# Patient Record
Sex: Male | Born: 1995 | Race: White | Hispanic: No | Marital: Single | Smoking: Never smoker
Health system: Southern US, Community
[De-identification: ages and names within clinical notes are randomized; demographics above are authoritative.]

## PROBLEM LIST (undated history)

## (undated) DIAGNOSIS — G4730 Sleep apnea, unspecified: Secondary | ICD-10-CM

## (undated) DIAGNOSIS — K760 Fatty (change of) liver, not elsewhere classified: Secondary | ICD-10-CM

## (undated) DIAGNOSIS — F419 Anxiety disorder, unspecified: Secondary | ICD-10-CM

## (undated) DIAGNOSIS — K589 Irritable bowel syndrome without diarrhea: Secondary | ICD-10-CM

## (undated) DIAGNOSIS — M549 Dorsalgia, unspecified: Secondary | ICD-10-CM

## (undated) DIAGNOSIS — F909 Attention-deficit hyperactivity disorder, unspecified type: Secondary | ICD-10-CM

## (undated) DIAGNOSIS — F32A Depression, unspecified: Secondary | ICD-10-CM

## (undated) DIAGNOSIS — M2550 Pain in unspecified joint: Secondary | ICD-10-CM

## (undated) DIAGNOSIS — R0602 Shortness of breath: Secondary | ICD-10-CM

## (undated) DIAGNOSIS — J45909 Unspecified asthma, uncomplicated: Secondary | ICD-10-CM

## (undated) DIAGNOSIS — F1990 Other psychoactive substance use, unspecified, uncomplicated: Secondary | ICD-10-CM

## (undated) HISTORY — DX: Depression, unspecified: F32.A

## (undated) HISTORY — DX: Sleep apnea, unspecified: G47.30

## (undated) HISTORY — DX: Anxiety disorder, unspecified: F41.9

## (undated) HISTORY — DX: Unspecified asthma, uncomplicated: J45.909

## (undated) HISTORY — DX: Pain in unspecified joint: M25.50

## (undated) HISTORY — DX: Shortness of breath: R06.02

## (undated) HISTORY — DX: Dorsalgia, unspecified: M54.9

## (undated) HISTORY — PX: WISDOM TOOTH EXTRACTION: SHX21

## (undated) HISTORY — DX: Fatty (change of) liver, not elsewhere classified: K76.0

## (undated) HISTORY — DX: Other psychoactive substance use, unspecified, uncomplicated: F19.90

## (undated) HISTORY — DX: Irritable bowel syndrome, unspecified: K58.9

---
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
## 2018-06-21 ENCOUNTER — Ambulatory Visit (INDEPENDENT_AMBULATORY_CARE_PROVIDER_SITE_OTHER): Payer: 59 | Admitting: Clinical

## 2018-06-21 DIAGNOSIS — Z62820 Parent-biological child conflict: Secondary | ICD-10-CM | POA: Diagnosis not present

## 2018-07-05 ENCOUNTER — Ambulatory Visit (INDEPENDENT_AMBULATORY_CARE_PROVIDER_SITE_OTHER): Payer: 59 | Admitting: Clinical

## 2018-07-05 DIAGNOSIS — F4323 Adjustment disorder with mixed anxiety and depressed mood: Secondary | ICD-10-CM | POA: Diagnosis not present

## 2018-07-12 ENCOUNTER — Ambulatory Visit (INDEPENDENT_AMBULATORY_CARE_PROVIDER_SITE_OTHER): Payer: 59 | Admitting: Clinical

## 2018-07-12 DIAGNOSIS — F4323 Adjustment disorder with mixed anxiety and depressed mood: Secondary | ICD-10-CM | POA: Diagnosis not present

## 2018-07-19 ENCOUNTER — Ambulatory Visit (INDEPENDENT_AMBULATORY_CARE_PROVIDER_SITE_OTHER): Payer: 59 | Admitting: Clinical

## 2018-07-19 DIAGNOSIS — F4323 Adjustment disorder with mixed anxiety and depressed mood: Secondary | ICD-10-CM | POA: Diagnosis not present

## 2018-07-24 ENCOUNTER — Ambulatory Visit (INDEPENDENT_AMBULATORY_CARE_PROVIDER_SITE_OTHER): Payer: 59 | Admitting: Clinical

## 2018-07-24 DIAGNOSIS — F4323 Adjustment disorder with mixed anxiety and depressed mood: Secondary | ICD-10-CM | POA: Diagnosis not present

## 2018-08-07 ENCOUNTER — Ambulatory Visit (INDEPENDENT_AMBULATORY_CARE_PROVIDER_SITE_OTHER): Payer: 59 | Admitting: Clinical

## 2018-08-07 DIAGNOSIS — F4323 Adjustment disorder with mixed anxiety and depressed mood: Secondary | ICD-10-CM

## 2018-09-05 ENCOUNTER — Ambulatory Visit (INDEPENDENT_AMBULATORY_CARE_PROVIDER_SITE_OTHER): Payer: 59 | Admitting: Clinical

## 2018-09-05 DIAGNOSIS — Z62820 Parent-biological child conflict: Secondary | ICD-10-CM

## 2018-09-13 ENCOUNTER — Ambulatory Visit (INDEPENDENT_AMBULATORY_CARE_PROVIDER_SITE_OTHER): Payer: 59 | Admitting: Clinical

## 2018-09-13 DIAGNOSIS — Z62820 Parent-biological child conflict: Secondary | ICD-10-CM | POA: Diagnosis not present

## 2018-10-13 ENCOUNTER — Ambulatory Visit (INDEPENDENT_AMBULATORY_CARE_PROVIDER_SITE_OTHER): Payer: 59 | Admitting: Clinical

## 2018-10-13 DIAGNOSIS — F322 Major depressive disorder, single episode, severe without psychotic features: Secondary | ICD-10-CM

## 2018-11-24 ENCOUNTER — Ambulatory Visit (INDEPENDENT_AMBULATORY_CARE_PROVIDER_SITE_OTHER): Payer: 59 | Admitting: Clinical

## 2018-11-24 DIAGNOSIS — F4323 Adjustment disorder with mixed anxiety and depressed mood: Secondary | ICD-10-CM | POA: Diagnosis not present

## 2018-12-14 ENCOUNTER — Ambulatory Visit: Payer: 59 | Admitting: Clinical

## 2018-12-19 ENCOUNTER — Ambulatory Visit: Payer: 59 | Admitting: Clinical

## 2019-05-20 ENCOUNTER — Other Ambulatory Visit: Payer: Self-pay | Admitting: Family Medicine

## 2019-05-20 DIAGNOSIS — R748 Abnormal levels of other serum enzymes: Secondary | ICD-10-CM

## 2019-05-24 ENCOUNTER — Ambulatory Visit
Admission: RE | Admit: 2019-05-24 | Discharge: 2019-05-24 | Disposition: A | Discharge status: Home / Self Care | Payer: 59 | Source: Ambulatory Visit | Attending: Family Medicine | Admitting: Family Medicine

## 2019-05-24 DIAGNOSIS — R748 Abnormal levels of other serum enzymes: Secondary | ICD-10-CM

## 2020-05-03 ENCOUNTER — Other Ambulatory Visit: Payer: Self-pay

## 2020-05-03 ENCOUNTER — Ambulatory Visit (INDEPENDENT_AMBULATORY_CARE_PROVIDER_SITE_OTHER): Payer: 59 | Admitting: Psychiatry

## 2020-05-03 DIAGNOSIS — F332 Major depressive disorder, recurrent severe without psychotic features: Secondary | ICD-10-CM

## 2020-05-03 DIAGNOSIS — F4010 Social phobia, unspecified: Secondary | ICD-10-CM

## 2020-05-03 DIAGNOSIS — F900 Attention-deficit hyperactivity disorder, predominantly inattentive type: Secondary | ICD-10-CM | POA: Diagnosis not present

## 2020-05-03 DIAGNOSIS — F1910 Other psychoactive substance abuse, uncomplicated: Secondary | ICD-10-CM | POA: Diagnosis not present
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
## 2020-05-12 ENCOUNTER — Ambulatory Visit (INDEPENDENT_AMBULATORY_CARE_PROVIDER_SITE_OTHER): Payer: 59 | Admitting: Psychiatry

## 2020-05-12 ENCOUNTER — Ambulatory Visit: Payer: 59 | Admitting: Psychiatry

## 2020-05-12 ENCOUNTER — Other Ambulatory Visit: Payer: Self-pay

## 2020-05-12 DIAGNOSIS — F4010 Social phobia, unspecified: Secondary | ICD-10-CM

## 2020-05-12 DIAGNOSIS — F1910 Other psychoactive substance abuse, uncomplicated: Secondary | ICD-10-CM

## 2020-05-12 DIAGNOSIS — F900 Attention-deficit hyperactivity disorder, predominantly inattentive type: Secondary | ICD-10-CM | POA: Diagnosis not present

## 2020-05-12 DIAGNOSIS — R69 Illness, unspecified: Secondary | ICD-10-CM

## 2020-05-12 DIAGNOSIS — F332 Major depressive disorder, recurrent severe without psychotic features: Secondary | ICD-10-CM | POA: Diagnosis not present

## 2020-05-12 NOTE — Progress Notes (Signed)
Psychotherapy Progress Note [facility] [name], PhD LP  Patient ID: Louis [name]     MRN: [id number] Therapy format: Family therapy w/ patient -- accompanied by Louis [name] Date: 05/12/2020      Start: 9:13a     Stop: 10:03a     Time Spent: 50 min Location: In-person   Session narrative (presenting needs, interim history, self-report of stressors and symptoms, applications of prior therapy, status changes, and interventions made in session) M reports she and F see a few smiles since coming off medication and coming home, about 5 wks now.  Now off Adderall about 4 wks, no Klonopin since.  Little pressure to anxiety, still off pot, remains on escitalopram.  Days are OK, doesn't feel like he's doing enough.  Organizing his belongings to bring home his remaining things.  Seeking info online about computer science.  Engaged a Systems analyst, has gotten a couple sessions of weight training, likes it.  Sleep schedule stable trying to get to bed 11:30ish, but varies when falls asleep, a long as 2am.  Rising 9:30am, but takes prodding.  Have ordered orange glasses, arriving today.  Day naps can happen, as long as 1.5 hrs.  Feeding schedule was very irregular at [facility], more regular now at home.  Some meals skip, but enough calories.  M notices a lot of night snacking -- PB and crackers, brownies if available.  M tries to wake him from naps and encourage him out of his room.    Addressed M's wishes for guidance how and how much to push Louis [name], respectful process for helping him with sleep discipline and taking on tasks organizing his room.  Will be going to [facility] today to pick up non-furniture belongings (movers will show next week).  Emphasized adult agreement and personal autonomy for "coaching" interactions, trust in Louis [name]'s word how current efforts are working, constructive perspective, and breaking down intimidating tasks small enough to be willing to start, see through, and stop.   Agreements made for several projects to improve circadian rhythm.  Therapeutic modalities: Cognitive Behavioral Therapy, Solution-Oriented/Positive Psychology, and Psycho-education/Bibliotherapy  Mental Status/Observations:  Appearance:   Casual     Behavior:  Appropriate  Motor:  Normal  Speech/Language:   Clear and Coherent  Affect:  Appropriate  Mood:  anxious, dysthymic, and less  Thought process:  normal  Thought content:    WNL  Sensory/Perceptual disturbances:    WNL  Orientation:  Fully oriented  Attention:  Good    Concentration:  Good  Memory:  WNL  Insight:    Good  Judgment:   Good  Impulse Control:  Fair   Risk Assessment: Danger to Self: No Self-injurious Behavior: No Danger to Others: No Physical Aggression / Violence: No Duty to Warn: No Access to Firearms a concern: No  Assessment of progress:  progressing  Diagnosis:   ICD-10-CM   1. 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 Attention deficit hyperactivity disorder (ADHD), predominantly inattentive type  F90.0     3. Social anxiety disorder  F40.10     4. Polysubstance abuse (HCC)  F19.10     5. r/o adverse effect of amphetamine  R69      Plan:  Try a 10pm calorie curfew, water maybe after that 45-minute standard for giving Louis [name] a "nudge" to wake up from nap.  Procedurally, will ask Louis [name] to sit up and/or stand to see that it's awake enough,  be ready to remind that it's the deal he wanted, and if it turns into a bigger struggle than that, be ready to let him have his autonomy.   Endorse the personal trainer idea Endorse lighting well in the morning -- option to use mood light at home. Continue recommendation to use amber lenses. Endorse brisk awakening Encourage isolating cleanup tasks for the room Possible activity scheduling Other recommendations/advice as may be noted above Continue to utilize previously learned skills ad lib Maintain  medication as prescribed and work faithfully with relevant prescriber(s) if any changes are desired or seem indicated Call the clinic on-call service, present to ER, or call 911 if any life-threatening psychiatric crisis Return in about 1 week (around 05/19/2020) for time as available, recommend scheduling ahead. Already scheduled visit in this office 05/19/2020.  Louis Fries, PhD [name], PhD LP Clinical Psychologist, [facility] [address] [place], [place] [address] [phone]

## 2020-05-16 ENCOUNTER — Ambulatory Visit: Payer: 59 | Admitting: Adult Health

## 2020-05-19 ENCOUNTER — Ambulatory Visit (INDEPENDENT_AMBULATORY_CARE_PROVIDER_SITE_OTHER): Payer: 59 | Admitting: Psychiatry

## 2020-05-19 ENCOUNTER — Other Ambulatory Visit: Payer: Self-pay

## 2020-05-19 DIAGNOSIS — F332 Major depressive disorder, recurrent severe without psychotic features: Secondary | ICD-10-CM | POA: Diagnosis not present

## 2020-05-19 DIAGNOSIS — F1910 Other psychoactive substance abuse, uncomplicated: Secondary | ICD-10-CM

## 2020-05-19 DIAGNOSIS — R69 Illness, unspecified: Secondary | ICD-10-CM

## 2020-05-19 DIAGNOSIS — F4010 Social phobia, unspecified: Secondary | ICD-10-CM

## 2020-05-19 DIAGNOSIS — F900 Attention-deficit hyperactivity disorder, predominantly inattentive type: Secondary | ICD-10-CM | POA: Diagnosis not present

## 2020-05-19 NOTE — Progress Notes (Signed)
Psychotherapy Progress Note [facility] [name], PhD LP  Patient ID: Louis [name]     MRN: [id number] Therapy format: Family therapy w/ patient -- accompanied by Louis [name] Date: 05/19/2020      Start: 8:18a     Stop: 9:06a     Time Spent: 48 min Location: In-person   Session narrative (presenting needs, interim history, self-report of stressors and symptoms, applications of prior therapy, status changes, and interventions made in session) Early success breaking down his clutter pile by looking and choosing smaller pieces of it, e.g., one bag at a time.  Observing the 10pm calorie curfew.  Helpful with the furniture move.  Socially, got out with a friend, his ex (parted amiably, logistics demanded).    Process for "nudging" wakeups working to some extent.  M does want him to start using his own alarm effectively.  Discussed tactics. Mother trying to get him outside for small tasks, get sunshine, engage more.  Wants to get him broken out of hours of gaming, eating better, circulating.  Currently engaged with a trainer 2/wk and liking the exercise.  Quandaries about how and how much to press [name] to do things.  Advice to keep things framed as his adult choice, be authentic about herself believing in and wanting him to try these things, but leave it to his choice.  Notes stomach pain that happens from time to time, figures he has some form of IBS, urgency to go sometimes, especially under stress.  Has hyoscyamine.    Notes a hobby of speed-running [name] game, serves as an attachment to the past.    Discussed introversion, hx of GI issues in family, how [name] actually was devastated with breaking up.    Discussed likely insurance conditions re. frequent use of services.  As [name] is not presently suicidal, frequency is likely to be challenged on review if continued beyond a couple more weeks.  Agree to wean schedule.  Therapeutic modalities: Cognitive Behavioral  Therapy, Solution-Oriented/Positive Psychology, and Psycho-education/Bibliotherapy  Mental Status/Observations:  Appearance:   Casual     Behavior:  Appropriate  Motor:  Normal  Speech/Language:   Clear and Coherent  Affect:  Appropriate  Mood:  anxious  Thought process:  normal  Thought content:    WNL  Sensory/Perceptual disturbances:    WNL  Orientation:  Fully oriented  Attention:  Good    Concentration:  Fair  Memory:  WNL  Insight:    Good  Judgment:   Fair  Impulse Control:  Fair   Risk Assessment: Danger to Self: No Self-injurious Behavior: No Danger to Others: No Physical Aggression / Violence: No Duty to Warn: No Access to Firearms a concern: No  Assessment of progress:  progressing  Diagnosis:   ICD-10-CM   1. 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 Attention deficit hyperactivity disorder (ADHD), predominantly inattentive type  F90.0     3. Social anxiety disorder  F40.10     4. Polysubstance abuse (HCC)  F19.10     5. r/o IBS  R69      Plan:  Continue circadian rhythm interventions with light control, food curfew, and regulating bed/wake Resolved [name] set his own alarm, mother give half hour to prompt him if affordable, and seek to get him upright before letting him decide his next move. Advice to mother change the pronouns, change the pressure -- use 1st or 3rd person, not 2nd in providing  instructions or wishes For outbursts or anger both ways, recommend do-overs to cast more positive atmosphere and motivate to practice more functional behavior rather than overfocus on dysfunctional Referral to psychiatry, at request Other recommendations/advice as may be noted above Continue to utilize previously learned skills ad lib Maintain medication as prescribed and work faithfully with relevant prescriber(s) if any changes are desired or seem indicated  Call the clinic on-call service, present to ER, or call  911 if any life-threatening psychiatric crisis Return 1-2/wk as deemed necessary. Already scheduled visit in this office 05/22/2020.  Robley Fries, PhD [name], PhD LP Clinical Psychologist, [facility] [address] [place], [place] [address] [phone]

## 2020-05-21 IMAGING — US US ABDOMEN COMPLETE
2 series · 14 of 25 positions shown · non-contrast
Comparison: None.

CLINICAL DATA: Elevated liver enzymes

EXAM:
ABDOMEN ULTRASOUND COMPLETE

[Series 1: us abdomen complete · 0.19mm/px · 10 of 62 slices shown (1 of 2)]
[im 1/62]
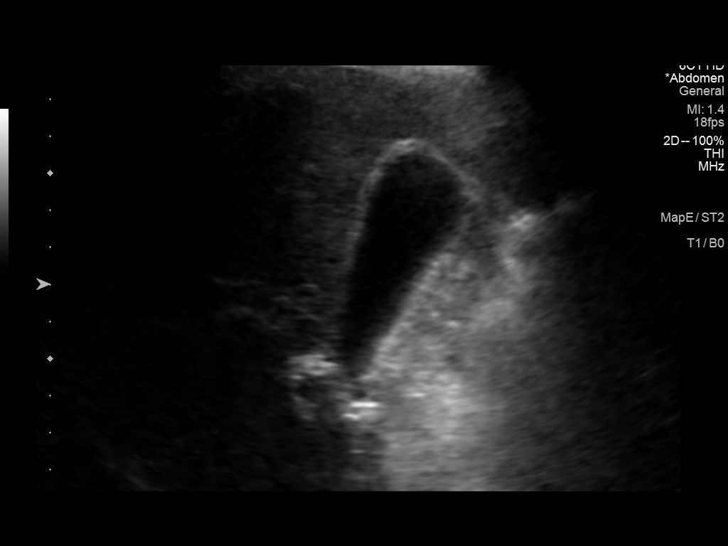
[im 8/62]
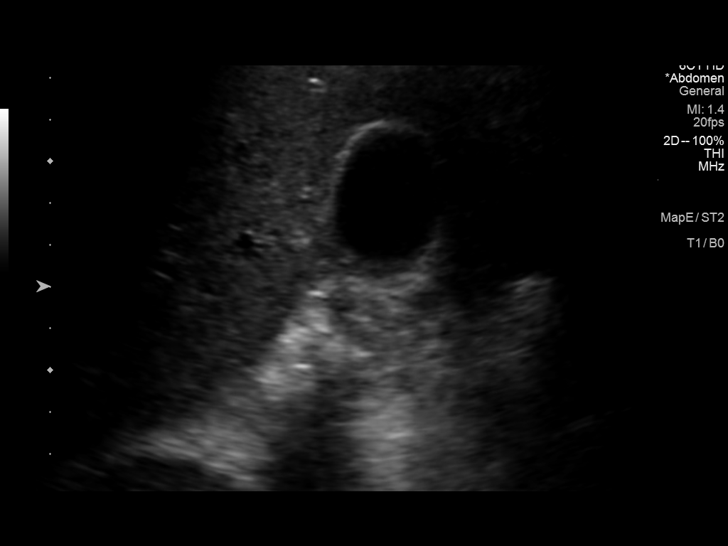
[im 16/62]
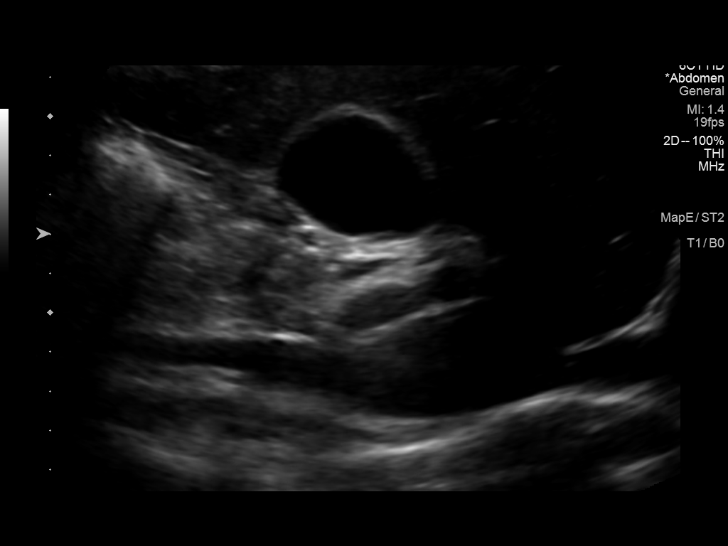
[im 23/62]
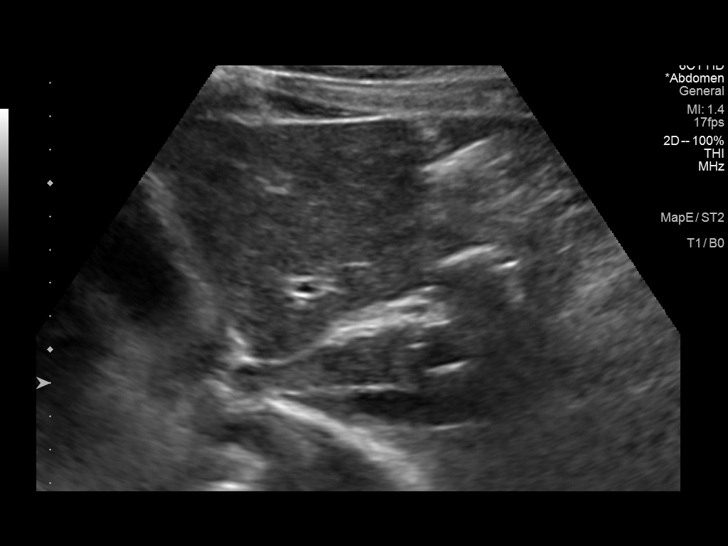
[im 31/62]
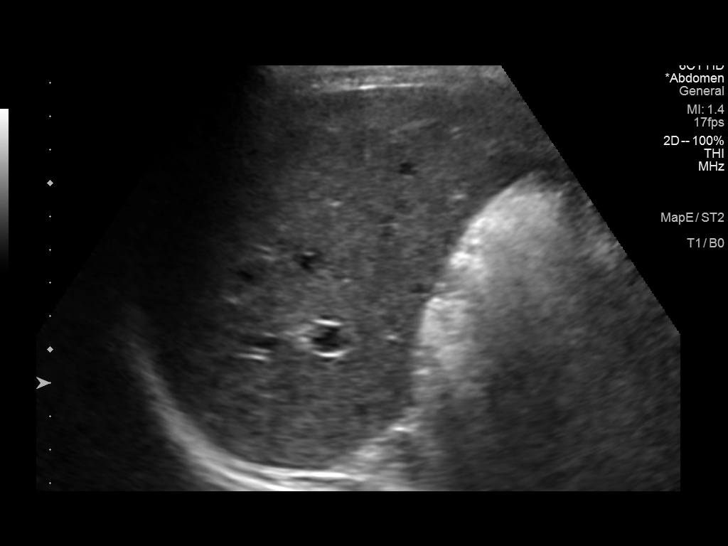
[im 35/62]
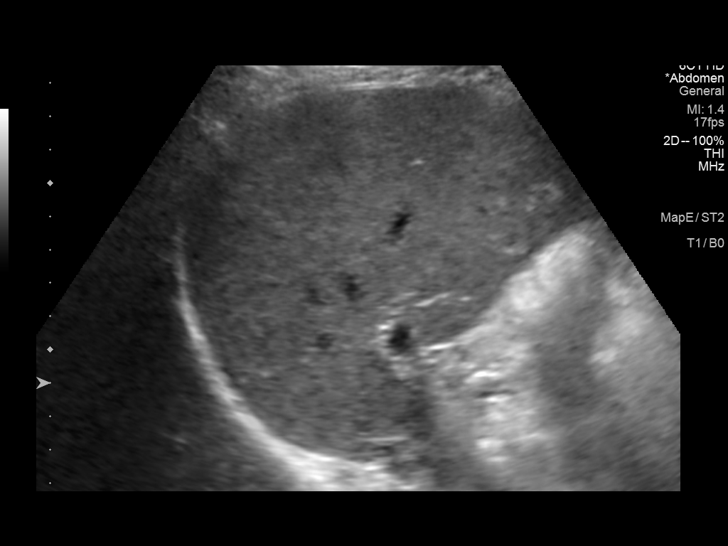
[im 42/62]
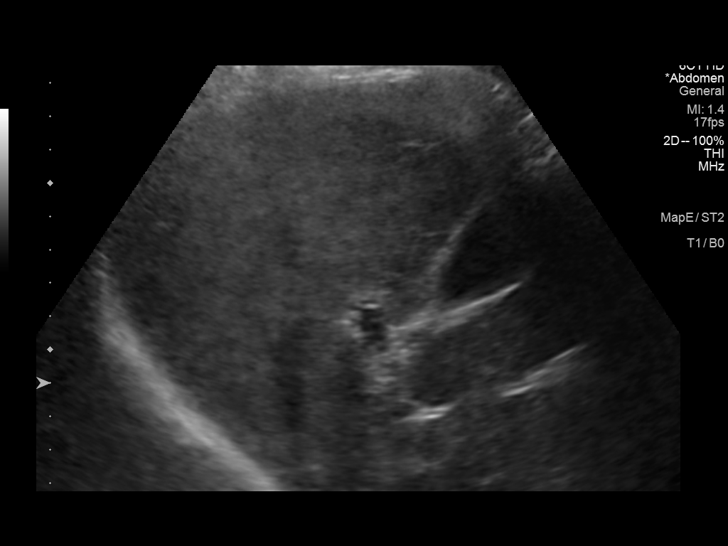
[im 50/62]
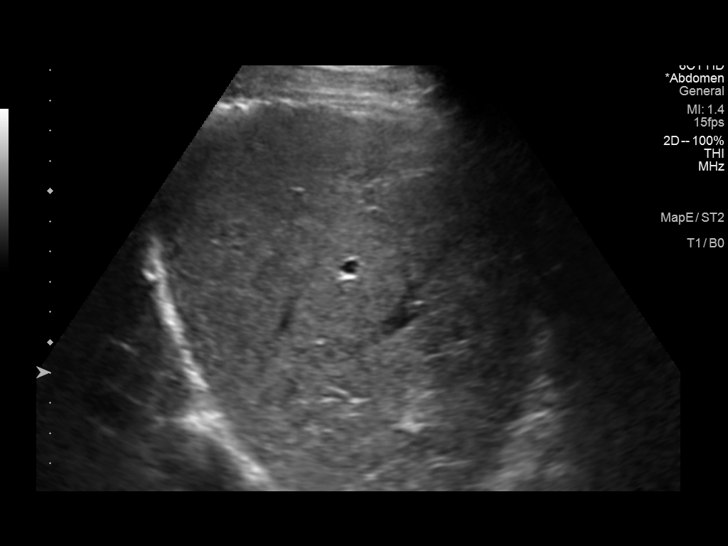
[im 58/62]
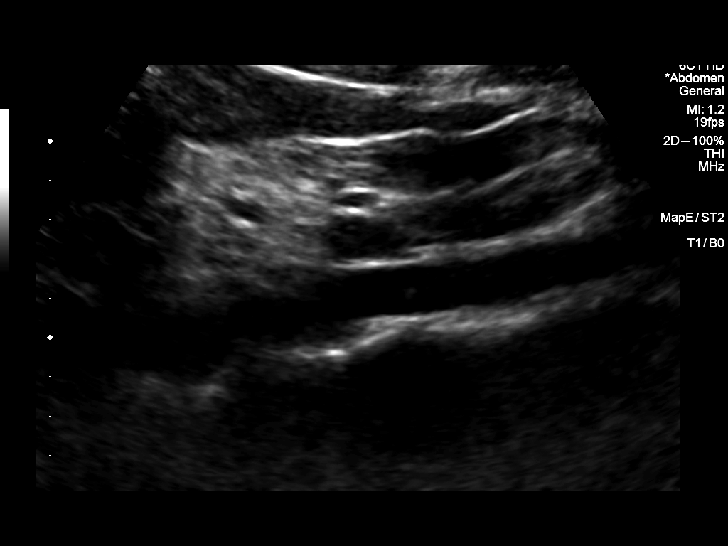
[im 62/62]
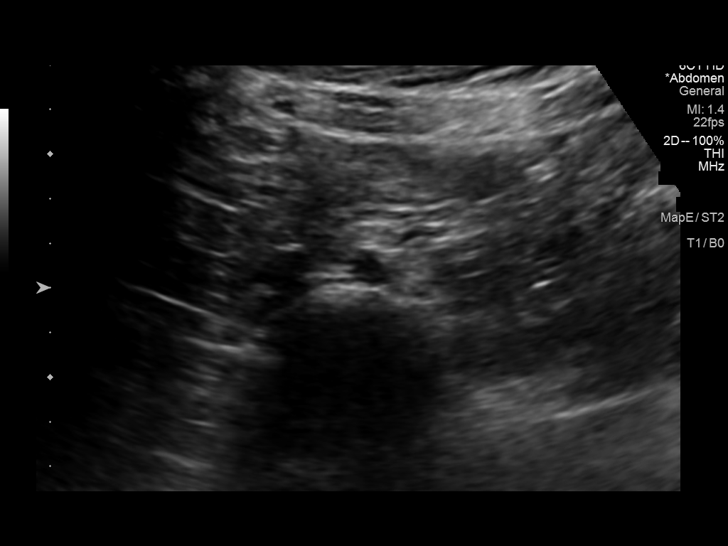

[Series 2001: us abdomen complete · 0.25mm/px · 4 of 30 slices shown (2 of 2)]
[im 5/30]
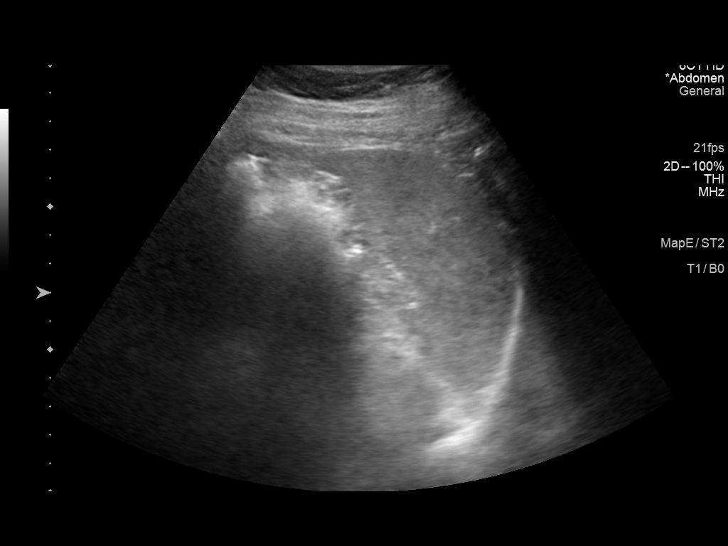
[im 13/30]
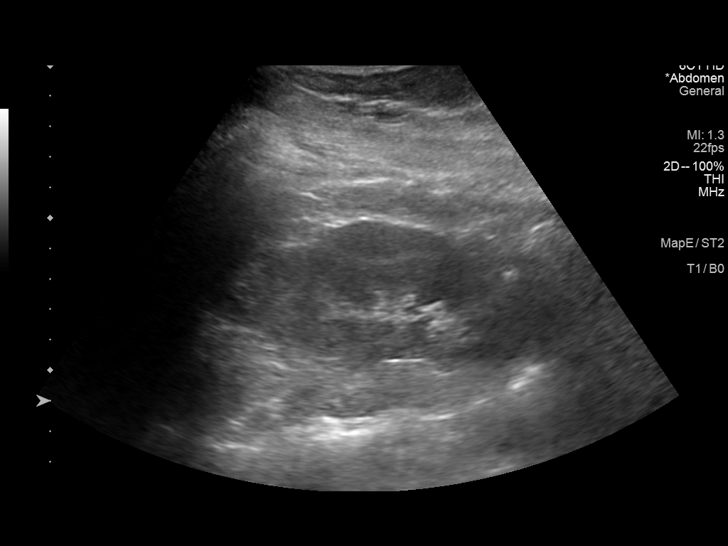
[im 21/30]
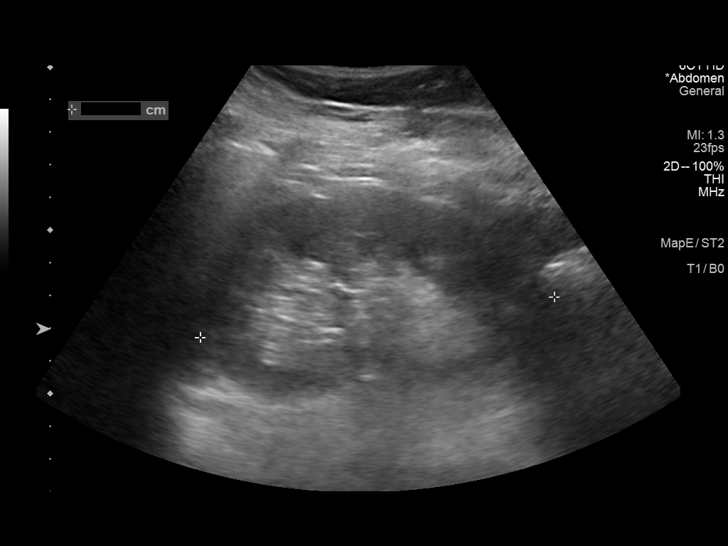
[im 30/30]
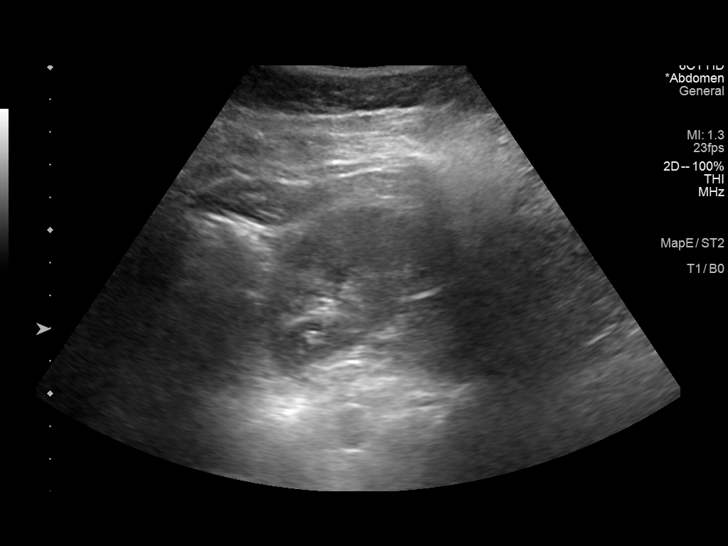

[14 of 25 positions shown; findings below may reference images not displayed]

FINDINGS: Gallbladder: No gallstones or wall thickening visualized. No
sonographic Murphy sign noted by sonographer.

Common bile duct: Diameter: 4 mm, normal

Liver: No focal lesion identified. Within normal limits in
parenchymal echogenicity. Portal vein is patent on color Doppler
imaging with normal direction of blood flow towards the liver.

IVC: No abnormality visualized.

Pancreas: Obscured by bowel gas.

Spleen: Size and appearance within normal limits.

Right Kidney: Length: 11.9 cm. Echogenicity within normal limits. No
mass or hydronephrosis visualized.

Left Kidney: Length: 11 cm. Echogenicity within normal limits. No
mass or hydronephrosis visualized.

Abdominal aorta: No aneurysm visualized.

Other findings: None.
IMPRESSION: Normal ultrasound of the abdomen.

## 2020-05-22 ENCOUNTER — Ambulatory Visit (INDEPENDENT_AMBULATORY_CARE_PROVIDER_SITE_OTHER): Payer: 59 | Admitting: Psychiatry

## 2020-05-22 ENCOUNTER — Other Ambulatory Visit: Payer: Self-pay

## 2020-05-22 ENCOUNTER — Ambulatory Visit: Payer: 59 | Admitting: Psychiatry

## 2020-05-22 DIAGNOSIS — R748 Abnormal levels of other serum enzymes: Secondary | ICD-10-CM

## 2020-05-22 DIAGNOSIS — F900 Attention-deficit hyperactivity disorder, predominantly inattentive type: Secondary | ICD-10-CM

## 2020-05-22 DIAGNOSIS — F1910 Other psychoactive substance abuse, uncomplicated: Secondary | ICD-10-CM

## 2020-05-22 DIAGNOSIS — F4010 Social phobia, unspecified: Secondary | ICD-10-CM | POA: Diagnosis not present

## 2020-05-22 DIAGNOSIS — R69 Illness, unspecified: Secondary | ICD-10-CM

## 2020-05-22 DIAGNOSIS — F332 Major depressive disorder, recurrent severe without psychotic features: Secondary | ICD-10-CM | POA: Diagnosis not present
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
## 2020-05-25 ENCOUNTER — Ambulatory Visit: Payer: 59 | Admitting: Psychiatry

## 2020-05-30 ENCOUNTER — Other Ambulatory Visit: Payer: Self-pay

## 2020-05-30 ENCOUNTER — Ambulatory Visit (INDEPENDENT_AMBULATORY_CARE_PROVIDER_SITE_OTHER): Payer: 59 | Admitting: Psychiatry

## 2020-05-30 DIAGNOSIS — F1910 Other psychoactive substance abuse, uncomplicated: Secondary | ICD-10-CM | POA: Diagnosis not present

## 2020-05-30 DIAGNOSIS — F4010 Social phobia, unspecified: Secondary | ICD-10-CM

## 2020-05-30 DIAGNOSIS — F331 Major depressive disorder, recurrent, moderate: Secondary | ICD-10-CM | POA: Diagnosis not present

## 2020-05-30 DIAGNOSIS — F900 Attention-deficit hyperactivity disorder, predominantly inattentive type: Secondary | ICD-10-CM

## 2020-05-30 DIAGNOSIS — R69 Illness, unspecified: Secondary | ICD-10-CM

## 2020-05-30 DIAGNOSIS — R748 Abnormal levels of other serum enzymes: Secondary | ICD-10-CM
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
## 2020-06-02 ENCOUNTER — Ambulatory Visit: Payer: 59 | Admitting: Psychiatry

## 2020-06-06 ENCOUNTER — Ambulatory Visit: Payer: 59 | Admitting: Psychiatry

## 2020-06-08 ENCOUNTER — Ambulatory Visit: Payer: 59 | Admitting: Psychiatry

## 2020-06-13 ENCOUNTER — Ambulatory Visit (INDEPENDENT_AMBULATORY_CARE_PROVIDER_SITE_OTHER): Payer: 59 | Admitting: Psychiatry

## 2020-06-13 ENCOUNTER — Other Ambulatory Visit: Payer: Self-pay

## 2020-06-13 DIAGNOSIS — F331 Major depressive disorder, recurrent, moderate: Secondary | ICD-10-CM

## 2020-06-13 DIAGNOSIS — F1910 Other psychoactive substance abuse, uncomplicated: Secondary | ICD-10-CM

## 2020-06-13 DIAGNOSIS — F4010 Social phobia, unspecified: Secondary | ICD-10-CM | POA: Diagnosis not present

## 2020-06-13 DIAGNOSIS — R748 Abnormal levels of other serum enzymes: Secondary | ICD-10-CM

## 2020-06-13 DIAGNOSIS — F900 Attention-deficit hyperactivity disorder, predominantly inattentive type: Secondary | ICD-10-CM

## 2020-06-13 DIAGNOSIS — R69 Illness, unspecified: Secondary | ICD-10-CM

## 2020-06-13 NOTE — Progress Notes (Signed)
Psychotherapy Progress Note [facility] Louis Czar, PhD LP  Patient ID: Louis [name]     MRN: [id number] Therapy format: Family therapy w/ patient -- accompanied by Louis [name] Date: 06/13/2020      Start: 11:12a     Stop: 11:58a     Time Spent: 46 min Location: In-person   Session narrative (presenting needs, interim history, self-report of stressors and symptoms, applications of prior therapy, status changes, and interventions made in session) Recently cancelled sessions d/t new job.  Scheduled 1 wk from today for initial psychiatric review with Louis [name] of this office.  Reports job going very favorably so far.  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  The job is helping sleep much more effectively and beginning to turn in earlier to accommodate early rise time.  Much reduced napping.  Affirmed and encouraged.  Mother's observation that this is accurate for 1 week now.  He is traveling to friends and getting home at reasonable hours.  Requires much less assistance waking, and she has seems more spontaneous room cleaning.  Louis [name] rates his mood at 7/10 versus 0 at the time he withdrew from school and 3 at the time he began treatment.  Still intends to return to college.  Says for himself that he wanted to put himself in a more interactive job in order to help combat social anxiety and regain his old comfort zone from before and responsive to intervention college.  Individually, Louis [name] affirms that he is feeling useful in his work and that it will serve purposes until he returns to school.  Reports he continues to do better emotionally off stimulant but is struggling some with attention on the job, losing his place sometimes in order preparation and needing frequent reminders of how many of an item to make or how many he has been through.  Discussed the possibility of creating a system to keep count without having to pick up a writing instrument and  contaminate food preparation.  Regarding substance abuse, continues to find himself more clearheaded while not binging on pot.  Reports time with his friends as a respectful environment with no pressure to use and full respect if he chooses not to.  Consumption currently is 2 bowls in 2 days, as opposed to his history of 2 bowls by 11 AM.  He has also been switching to using delta 8 rather than conventional THC, which is further reducing brain fog while still delivering mild euphoria he seeks.  Estimates the high at 5 to 10% compared to previous 90%.  Mother seeks referral for her brother, with presenting complaints of being overweight and emotional eating.  Discussed ethics of treating multiple family members, availability and fit to other therapists in this practice, and made referral.  Therapeutic modalities: Cognitive Behavioral Therapy and Solution-Oriented/Positive Psychology  Mental Status/Observations:  Appearance:   Casual     Behavior:  Appropriate  Motor:  Normal  Speech/Language:   Clear and Coherent  Affect:  Appropriate  Mood:  normal  Thought process:  normal  Thought content:    WNL  Sensory/Perceptual disturbances:    WNL  Orientation:  Fully oriented  Attention:  Good    Concentration:  Good  Memory:  WNL  Insight:    Good  Judgment:   Good  Impulse Control:  Fair   Risk Assessment: Danger to Self: No Self-injurious Behavior: No Danger to Others: No Physical Aggression /  Violence: No Duty to Warn: No Access to Firearms a concern: No  Assessment of progress:  progressing well  Diagnosis:   ICD-10-CM   1. Major depressive disorder, recurrent episode, moderate with anxious distress (HCC)  F33.1     2. Attention deficit hyperactivity disorder (ADHD), predominantly inattentive type  F90.0     3. Social anxiety disorder  F40.10     4. Polysubstance abuse (HCC)  F19.10     5. r/o IBS  R69     6. Liver enzyme elevation (by self-report, possibly a kidney issue)   R74.8      Plan:  Continue efforts to regulate circadian rhythm and promote better activity for antidepression Continue communication measures where conflicts may be present Continue minimal prompting from mother for waking up, depending on performance Continue to advise abstinence from both alcohol, nicotine, and pot, allowing for use of delta 8 as a weaning strategy Other recommendations/advice as may be noted above Continue to utilize previously learned skills ad lib Maintain medication as prescribed and work faithfully with relevant prescriber(s) if any changes are desired or seem indicated Call the clinic on-call service, present to ER, or call 911 if any life-threatening psychiatric crisis Return in about 1 week (around 06/20/2020). Already scheduled visit in this office 06/20/2020.  Louis Fries, PhD Louis Czar, PhD LP Clinical Psychologist, [facility] [address], [address] [place], [place] [address] [phone]

## 2020-06-15 ENCOUNTER — Ambulatory Visit: Payer: 59 | Admitting: Adult Health

## 2020-06-16 ENCOUNTER — Ambulatory Visit: Payer: 59 | Admitting: Psychiatry

## 2020-06-20 ENCOUNTER — Ambulatory Visit (INDEPENDENT_AMBULATORY_CARE_PROVIDER_SITE_OTHER): Payer: 59 | Admitting: Psychiatry

## 2020-06-20 ENCOUNTER — Ambulatory Visit (INDEPENDENT_AMBULATORY_CARE_PROVIDER_SITE_OTHER): Payer: 59 | Admitting: Adult Health

## 2020-06-20 ENCOUNTER — Other Ambulatory Visit: Payer: Self-pay

## 2020-06-20 DIAGNOSIS — F1910 Other psychoactive substance abuse, uncomplicated: Secondary | ICD-10-CM

## 2020-06-20 DIAGNOSIS — F900 Attention-deficit hyperactivity disorder, predominantly inattentive type: Secondary | ICD-10-CM

## 2020-06-20 DIAGNOSIS — F331 Major depressive disorder, recurrent, moderate: Secondary | ICD-10-CM

## 2020-06-20 DIAGNOSIS — F411 Generalized anxiety disorder: Secondary | ICD-10-CM | POA: Diagnosis not present

## 2020-06-20 DIAGNOSIS — F4010 Social phobia, unspecified: Secondary | ICD-10-CM | POA: Diagnosis not present

## 2020-06-20 NOTE — Progress Notes (Signed)
Psychotherapy Progress Note [facility] Louis Czar, PhD LP  Patient ID: Louis [name]     MRN: [id number] Therapy format: Family therapy w/ patient -- accompanied by Louis [name] Date: 06/20/2020      Start: 11:20a     Stop: 12:12p     Time Spent: 52 min Location: In-person   Session narrative (presenting needs, interim history, self-report of stressors and symptoms, applications of prior therapy, status changes, and interventions made in session) Continues working job, getting up pretty easily w/o much assistance.  One challenge with getting his room and cleaning done before visiting friends last weekend.  Working up to vacation in [place] as a family.  Had an overly sleepy day, felt depressed.  Mother tried to emphasize doing the usual, healthy things she figures would be part of being more reliably functional.  Wants him to learn some more self-sufficiency, tentatively listing several things.  Today M notes relapse in vaping (nicotine), and she feels like an "accessory" for enabling it, with the money to buy it.  Family were bankrolling his school life in entirety.  Concerns for spending to eat out a lot and spending for an addictive substance.  Admits ground rules and standards have been hard for her and his father to define or hold over a relatively long period of time, and she feels guilty for it.  Established in principle that one likely parent stance would be to consider substances as "luxuries", so any allowance or subsidy to the older adolescent would do well to count the costs of necessities -- food, shelter, fuel, coverage, education costs when active -- and set support somewhere near that level, adjusting as they see fit for discretionary spending, hen leave it to the adolescent's discretion.  Nicotine has been part of the picture since age 25, typically via vape.  On and off it since home from school, with no established abstinence.  Disc the potential for lung damage, but  he does not use the form noted for "popcorn lung".  Purports to want to quit.  Limited experience with nicotine gum, no history of trying patch.  Has tried Wellbutrin, but adverse emotional reaction when on it and a stimulant simultaneously.  Encouraged look into insurance company's recommendations and offerings for cessation programs, as this is beyond my expertise and experience.  Offered that what I can help do, if a focus, is develop alternative behaviors for self-soothing in order to weather cravings and and strengthen motivation to abstain.  Therapeutic modalities: Cognitive Behavioral Therapy, Solution-Oriented/Positive Psychology, and Motivational Interviewing  Mental Status/Observations:  Appearance:   Casual     Behavior:  Appropriate  Motor:  Normal  Speech/Language:   Clear and Coherent  Affect:  Appropriate and Constricted  Mood:  anxious and dysthymic  Thought process:  normal  Thought content:    WNL  Sensory/Perceptual disturbances:    WNL  Orientation:  Fully oriented  Attention:  Good    Concentration:  Good  Memory:  WNL  Insight:    Good  Judgment:   Good  Impulse Control:  Fair   Risk Assessment: Danger to Self: No Self-injurious Behavior: No Danger to Others: No Physical Aggression / Violence: No Duty to Warn: No Access to Firearms a concern: No  Assessment of progress:  stabilized  Diagnosis:   ICD-10-CM   1. Major depressive disorder, recurrent episode, moderate with anxious distress (HCC)  F33.1     2. Attention deficit hyperactivity disorder (ADHD), predominantly inattentive type  F90.0     3. Social anxiety disorder  F40.10     4. Polysubstance abuse (HCC) -- nicotine, THC  F19.10      Plan:  Continue efforts to regulate circadian rhythm and promote better activity for antidepression Continue communication measures where conflicts may be present Continue minimal prompting from mother for waking up, depending on performance Continue to advise  abstinence from alcohol, nicotine, and pot, allowing for use of delta 8 as a weaning strategy Look into smoking cessation services via insurance company, review motivations to quit vape Parents option to rethink monetary support and whether to tie some of it to [employer] work Other recommendations/advice as may be noted above Continue to utilize previously learned skills ad lib Maintain medication as prescribed and work faithfully with relevant prescriber(s) if any changes are desired or seem indicated Call the clinic on-call service, present to ER, or call 911 if any life-threatening psychiatric crisis Return in about 2 weeks (around 07/04/2020), or following travel. Already scheduled visit in this office 06/20/2020.  Robley Fries, PhD Louis Czar, PhD LP Clinical Psychologist, [facility] [address], [address] [place], [place] [address] [phone]

## 2020-06-20 NOTE — Progress Notes (Signed)
[facility] MD[facility]PA/NP Initial Note  06/21/2020 12:59 PM Louis [name]  MRN:  [id number]  Chief Complaint:   Accompanied by mother - also contributing to interview.  HPI: Describes mood today as "ok". Pleasant. Flat. Mood symptoms - reports depression - sometimes, not the worst it's ever been, but not perfect. Increased depression while at school. Increased anxiety at times - worse while in school. Feels like the anxiety has improved since returning home. Increased panic attacks while in school. Feels irritable at times. Currently taking Lexapro at 20mg  and feels like it is helpful. Started 2 months ago. Has taken Adderall in the past. Is now off of the Adderall x 1 month. Mother notes he seems better without it. However, he feels like it was helpful. Noting it could have been other things he was doing as well.  Feels like his energy is really low and has issues remembering things in work setting - "can't focus on things". Feels like he gets "stuck a lot". Concerned about returning to school without medication.Plans to take one more semester off before returning to school. Stable interest and motivation. Taking medications as prescribed.  Energy levels lower than average. Active, has a regular exercise routine. Working with a . Enjoys some usual interests and activities. Single. Not dating. Lives with parents. Has a cat. Spending time with family and friends. Appetite adequate. Weight gain recently - overeating at night.  Sleeps well most nights. Averages 7 to 8 hours - sleep schedule more consistent recently. Focus and concentration difficulties. Diagnosed with ADHD at age 59 years - CAS. Symptoms present since second grade. 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 Completing tasks. Managing aspects of household. Working as a [employer] currently - 30 hours. Denies SI or HI.  Denies AH or VH. Substance use - nicotine vaping. History of  THC. History of ETOH use - elevated LFT's. Following up with Dr. Financial risk analyst at [facility].  Previous medication trials: Prozac, Wellbutrin, Vyvanse, Adderall  Visit Diagnosis:    ICD-10-CM   1. Major depressive disorder, recurrent episode, moderate (HCC)  F33.1     2. Generalized anxiety disorder  F41.1     3. Attention deficit hyperactivity disorder (ADHD), predominantly inattentive type  F90.0     4. Polysubstance abuse (HCC)  F19.10       Past Psychiatric History: Denies psychiatric hospitalization.   Past Medical History:  Past Medical History:  Diagnosis Date   ADHD    No past surgical history on file.  Family Psychiatric History: Family history of mental illness - father and brother with depression.   Family History: No family history on file.  Social History:  Social History   Socioeconomic History   Marital status: Single    Spouse name: Not on file   Number of children: Not on file   Years of education: Not on file   Highest education level: Not on file  Occupational History   Not on file  Tobacco Use   Smoking status: Never   Smokeless tobacco: Never  Substance and Sexual Activity   Alcohol use: Not on file   Drug use: Not on file   Sexual activity: Not on file  Other Topics Concern   Not on file  Social History Narrative   Not on file   Social Determinants of Health   Financial Resource Strain: Not on file  Food Insecurity: Not on file  Transportation Needs: Not on file  Physical Activity:  Not on file  Stress: Not on file  Social Connections: Not on file    Allergies: No Known Allergies  Metabolic Disorder Labs: No results found for: HGBA1C, MPG No results found for: PROLACTIN No results found for: CHOL, TRIG, HDL, CHOLHDL, VLDL, LDLCALC No results found for: TSH  Therapeutic Level Labs: No results found for: LITHIUM No results found for: VALPROATE No components found for:  CBMZ  Current Medications: Current Outpatient Medications   Medication Sig Dispense Refill   escitalopram (LEXAPRO) 20 MG tablet Take 1 tablet by mouth daily.     No current facility-administered medications for this visit.    Medication Side Effects: none  Orders placed this visit:  No orders of the defined types were placed in this encounter.   Psychiatric Specialty Exam:  Review of Systems  There were no vitals taken for this visit.There is no height or weight on file to calculate BMI.  General Appearance: Casual and Neat  Eye Contact:  Good  Speech:  Clear and Coherent and Normal Rate  Volume:  Normal  Mood:  Euthymic  Affect:  Appropriate, Congruent, and Flat  Thought Process:  Coherent and Descriptions of Associations: Intact  Orientation:  Full (Time, Place, and Person)  Thought Content: Logical   Suicidal Thoughts:  No  Homicidal Thoughts:  No  Memory:  WNL  Judgement:  Good  Insight:  Good  Psychomotor Activity:  Normal  Concentration:  Concentration: Fair  Recall:  Good  Fund of Knowledge: Good  Language: Good  Assets:  Communication Skills Desire for Improvement Financial Resources/Insurance Housing Intimacy Leisure Time Physical Health Resilience Social Support Talents/Skills Transportation Vocational/Educational  ADL's:  Intact  Cognition: WNL  Prognosis:  NA   Screenings:  Oceanographer Row Counselor from 05/03/2020 in [facility]  PHQ-2 Total Score 5  PHQ-9 Total Score 20       Receiving Psychotherapy: Yes Andrew Mitchum.  Treatment Plan/Recommendations:   Plan:  PDMP reviewed  Continue Lexapro 20mg  daily x 2 to 3 months  Working with Dr  Completed Genesight testing while in office. Plan to consider ADHD alternatives outside of stimulants.  Has ceased substance use.   Time spent with patient was 60 minutes. Greater than 50% of face to face time with patient was spent on counseling and coordination of care.      RTC 4 weeks  Patient advised to  contact office with any questions, adverse effects, or acute worsening in signs and symptoms.      Farrel Demark, NP

## 2020-06-21 ENCOUNTER — Encounter: Payer: Self-pay | Admitting: Adult Health

## 2020-06-27 ENCOUNTER — Ambulatory Visit: Payer: 59 | Admitting: Psychiatry

## 2020-07-04 ENCOUNTER — Ambulatory Visit: Payer: 59 | Admitting: Psychiatry

## 2020-07-05 ENCOUNTER — Ambulatory Visit (INDEPENDENT_AMBULATORY_CARE_PROVIDER_SITE_OTHER): Payer: 59 | Admitting: Psychiatry

## 2020-07-05 ENCOUNTER — Other Ambulatory Visit: Payer: Self-pay

## 2020-07-05 DIAGNOSIS — F331 Major depressive disorder, recurrent, moderate: Secondary | ICD-10-CM | POA: Diagnosis not present

## 2020-07-05 DIAGNOSIS — F1910 Other psychoactive substance abuse, uncomplicated: Secondary | ICD-10-CM | POA: Diagnosis not present

## 2020-07-05 DIAGNOSIS — F900 Attention-deficit hyperactivity disorder, predominantly inattentive type: Secondary | ICD-10-CM | POA: Diagnosis not present

## 2020-07-05 DIAGNOSIS — F4010 Social phobia, unspecified: Secondary | ICD-10-CM

## 2020-07-05 DIAGNOSIS — F411 Generalized anxiety disorder: Secondary | ICD-10-CM

## 2020-07-05 NOTE — Progress Notes (Signed)
Psychotherapy Progress Note [facility] Louis Czar, PhD LP  Patient ID: Louis [name]     MRN: [id number] Therapy format: Individual psychotherapy Date: 07/05/2020      Start: 5:08p     Stop: 5:56p     Time Spent: 48 min Location: In-person   Session narrative (presenting needs, interim history, self-report of stressors and symptoms, applications of prior therapy, status changes, and interventions made in session) Seen by med management same day as last therapy visit, choice made to continue Lexapro and conduct Genesight testing to inform med choices.  Stated he has ceased substance abuse, though this may have been a statement made with mother present, whereas he has acknowledged much-reduced pot use to Louis [name], only with friends.    Today, here alone.  Says he enjoyed the family trip to [name] better than expected.  Helps to be [name] weed.  Saw father validate that he can make his own decisions, e.g., begging [name] a too-hot hike, but Louis [name] found he just wanted to do more of what parents were doing than he had forecast.  Affirmed and encouraged in staying open to possibilities and in expressing appreciation for anything he discerns helped, e.g., seeing mother back [name] feeling like they had to stay together for the hike rather than being able to let Louis [name] for comfort.  Job working out.  Still feels need for something to help with focus, though he has learned the work better.  Continues personal training, to benefit.  Finds he wants to tame anger some more.  Some times when he is getting irritable, or more so than he expected, e.g., waiting for luggage 45 minutes at the airport, or having anticipated schedule change on hm last night.  Does not seem to be an organic, bipolar-like irritability but more the subtle, sometimes inarticulate experience of frustration built up, e.g., from things not working right, from feeling overmanaged by mother at times, but most often from  self-criticism and/or shaming for not getting his own mind to work efficiently.  Normalized as ADHD-related frustration and emphasized a combination of healthy venting, self-soothing techniques, and addressing any automatic thoughts which may exaggerate or entrench unrealistic self-expectations.  Knows his dad tends to be the same way, e.g., sharing a super-competitive nature when it comes to games they pursue (father golf, Excell select video games).  Can [name] shame himself over wasting time, or seeming to, e.g., with a game that is not very mentally challenging.  Relates that his activity of speed play with vintage Louis [name] game is sometimes a very effortful, competitive thing, challenging himself to hone his play and achieve very difficult things.  Affirmed the value in self-challenge, so long as it doesn't turn harsh to get there and he still has the authority to pause when it's worth it and change focus.  Reframed as challenge and sheer recreation both being useful, and the trick being to fully decide which mode to be in at a given moment, i.e., working by choice or playing by choice.  Introduced breathing techniques for alertness (cleansing breath), relaxation (large inhale, long exhale), and combination venting and breathing ("Oh ,,, shiiiiiit ..").  Had had recommended [employer] which would be a good read for getting better acquainted with humane self-talk.  Therapeutic modalities: Cognitive Behavioral Therapy and Solution-Oriented/Positive Psychology  Mental Status/Observations:  Appearance:   Casual     Behavior:  Appropriate  Motor:  Normal  Speech/Language:   Clear and Coherent  Affect:  Appropriate  Mood:  normal and some irritability  reported  Thought process:  normal  Thought content:    WNL  Sensory/Perceptual disturbances:    WNL  Orientation:  Fully oriented  Attention:  Good    Concentration:  Good  Memory:  WNL  Insight:    Good  Judgment:   Good  Impulse  Control:  Good   Risk Assessment: Danger to Self: No Self-injurious Behavior: No Danger to Others: No Physical Aggression / Violence: No Duty to Warn: No Access to Firearms a concern: No  Assessment of progress:  progressing well  Diagnosis:   ICD-10-CM   1. Major depressive disorder, recurrent episode, moderate (HCC)  F33.1     2. Generalized anxiety disorder  F41.1     3. Attention deficit hyperactivity disorder (ADHD), predominantly inattentive type  F90.0     4. Polysubstance abuse (HCC)  F19.10    mostly abstinent now     5. Social anxiety disorder  F40.10      Plan:  Practice breathing techniques as appropriate Practice permission to be "practicing" at any valued activity, not perfect Practice intentional choice -- work or play, either way "[name] the fence" and "choose to" > "have to", including in game time Endorse reading [employer] Continue temperate, occasional use of THC or abstain altogether as able Continue to manage sleep readiness, incl. use of amber lenses Mother continue to work on Editor, commissioning as adult choices, approach Sevag more as capable than to be worried about Other recommendations/advice as may be noted above Continue to utilize previously learned skills ad lib Maintain medication as prescribed and work faithfully with relevant prescriber(s) if any changes are desired or seem indicated Call the clinic on-call service, present to ER, or call 911 if any life-threatening psychiatric crisis Return for session(s) already scheduled. Already scheduled visit in this office 07/12/2020.  Robley Fries, PhD Louis Czar, PhD LP Clinical Psychologist, [facility] [address], [address] [place], [place] [address] [phone]

## 2020-07-12 ENCOUNTER — Other Ambulatory Visit: Payer: Self-pay

## 2020-07-12 ENCOUNTER — Ambulatory Visit (INDEPENDENT_AMBULATORY_CARE_PROVIDER_SITE_OTHER): Payer: 59 | Admitting: Psychiatry

## 2020-07-12 DIAGNOSIS — F331 Major depressive disorder, recurrent, moderate: Secondary | ICD-10-CM | POA: Diagnosis not present

## 2020-07-12 DIAGNOSIS — F411 Generalized anxiety disorder: Secondary | ICD-10-CM

## 2020-07-12 DIAGNOSIS — F900 Attention-deficit hyperactivity disorder, predominantly inattentive type: Secondary | ICD-10-CM

## 2020-07-12 DIAGNOSIS — F1910 Other psychoactive substance abuse, uncomplicated: Secondary | ICD-10-CM

## 2020-07-12 NOTE — Progress Notes (Signed)
Psychotherapy Progress Note [facility] [name], PhD LP  Patient ID: Louis [name]     MRN: [id number] Therapy format: Individual psychotherapy Date: 07/12/2020      Start: 5:05p     Stop: 5:55p     Time Spent: 50 min Location: In-person   Session narrative (presenting needs, interim history, self-report of stressors and symptoms, applications of prior therapy, status changes, and interventions made in session) Anger down this week.  Just worried about becoming complacent.  Figures home is not so cushy he would do that, but knows he is capable.  Recognizes his path in [employer] program was handicapped by a glut of applicants and not enough TAs.  Also pandemic conditions with remote learning made it much harder to concentrate, motivate, and find help for issues learning.  Breathing techniques useful -- cleansing breaths for driving alertness.  No real call for tension-release breathing.  Feels meds are working as hoped for anxiety and depression.  Focus remains some issue.  Discussed aspects of job he would like to be sharper about.    Sleep running 7+ hrs/night now, gym schedule is thin right now (1/wk).  Varies how it feels waking up, but has good light, and does reliably use the amber lenses at night.  Therapeutic modalities: Cognitive Behavioral Therapy and Solution-Oriented/Positive Psychology  Mental Status/Observations:  Appearance:   Casual     Behavior:  Appropriate  Motor:  Normal  Speech/Language:   Clear and Coherent  Affect:  Appropriate  Mood:  dysthymic  Thought process:  normal  Thought content:    WNL  Sensory/Perceptual disturbances:    WNL  Orientation:  Fully oriented  Attention:  Good    Concentration:  Good  Memory:  WNL  Insight:    Good  Judgment:   Good  Impulse Control:  Good   Risk Assessment: Danger to Self: No Self-injurious Behavior: No Danger to Others: No Physical Aggression / Violence: No Duty to Warn:  No Access to Firearms a concern: No  Assessment of progress:  progressing  Diagnosis:   ICD-10-CM   1. Major depressive disorder, recurrent episode, moderate (HCC)  F33.1     2. Generalized anxiety disorder  F41.1     3. Attention deficit hyperactivity disorder (ADHD), predominantly inattentive type  F90.0     4. 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  Otherwise continue exercise/training Work up palatable morning foods and protein-based grab food choices (e.g., smoothies, hard-boiled eggs) Other recommendations/advice as may be noted above Continue to utilize previously learned skills ad lib Maintain medication as prescribed and work faithfully with relevant prescriber(s) if any changes are desired or seem indicated Call the clinic on-call service, present to ER, or call 911 if any life-threatening psychiatric crisis No follow-ups on file. Already scheduled visit in this office 07/19/2020.  Robley Fries, PhD [name], PhD LP Clinical Psychologist, [facility] [address] [place], [place] [address] (o)  [phone] 

## 2020-07-19 ENCOUNTER — Ambulatory Visit (INDEPENDENT_AMBULATORY_CARE_PROVIDER_SITE_OTHER): Payer: 59 | Admitting: Psychiatry

## 2020-07-19 ENCOUNTER — Other Ambulatory Visit: Payer: Self-pay

## 2020-07-19 DIAGNOSIS — F331 Major depressive disorder, recurrent, moderate: Secondary | ICD-10-CM

## 2020-07-19 DIAGNOSIS — F411 Generalized anxiety disorder: Secondary | ICD-10-CM | POA: Diagnosis not present

## 2020-07-19 DIAGNOSIS — F900 Attention-deficit hyperactivity disorder, predominantly inattentive type: Secondary | ICD-10-CM

## 2020-07-19 NOTE — Progress Notes (Signed)
Psychotherapy Progress Note [facility] [name], PhD LP  Patient ID: Louis [name]     MRN: [id number] Therapy format: Family therapy w/ patient -- accompanied by Louis [name] Date: 07/19/2020      Start: 6:10p     Stop: 7:00p     Time Spent: 50 min Location: In-person   Session narrative (presenting needs, interim history, self-report of stressors and symptoms, applications of prior therapy, status changes, and interventions made in session) Louis [name] reports he seems to be happier, more satisfied.  Lingering concerns about him getting up and activating for the day, and for night eating.  Validated how they had a good time together in [place].  Addressed priorities for self-care.  Would like to manage his eating better, do better about balanced nutrition and refraining from late snacks (e.g., 1am).  Brokered agreement to have a 12 midnight calorie curfew.  Extensive discussion of parental intervention waking Louis [name] up, got his word more clearly that he wants to do it himself, would feel better managing it himself than being intervened on.  Brokered uneasy agreement to let him sink or swim getting up for his own commitments.  Therapeutic modalities: Cognitive Behavioral Therapy and Solution-Oriented/Positive Psychology  Mental Status/Observations:  Appearance:   Casual     Behavior:  Appropriate  Motor:  Normal  Speech/Language:   Clear and Coherent  Affect:  Constricted  Mood:  dysthymic  Thought process:  normal  Thought content:    WNL  Sensory/Perceptual disturbances:    WNL  Orientation:  Fully oriented  Attention:  Good    Concentration:  Good  Memory:  WNL  Insight:    Good  Judgment:   Good  Impulse Control:  Good   Risk Assessment: Danger to Self: No Self-injurious Behavior: No Danger to Others: No Physical Aggression / Violence: No Duty to Warn: No Access to Firearms a concern: No  Assessment of progress:   mixed  Diagnosis:   ICD-10-CM   1.  Major depressive disorder, recurrent episode, moderate (HCC)  F33.1     2. Generalized anxiety disorder  F41.1     3. Major depressive disorder, recurrent episode, moderate with anxious distress (HCC)  F33.1     4. Attention deficit hyperactivity disorder (ADHD), predominantly inattentive type  F90.0      Plan:  Mother/parents let Vi get himself up, don't intervene for fear of him being late -- let him own he success or failure himself calorie curfew, preferably earlier, but better than 1am.  Self-policing, but also try to prevent food debris in his room Continue sleep readiness measures, reasonable bedtime Still advocate less snack foods, more early protein Still find out what school requires and when for reentry Apply breathing tactics as needed Other recommendations/advice as may be noted above Continue to utilize previously learned skills ad lib Maintain medication as prescribed and work faithfully with relevant prescriber(s) if any changes are desired or seem indicated Call the clinic on-call service, present to ER, or call 911 if any life-threatening psychiatric crisis No follow-ups on file. Already scheduled visit in this office 08/03/2020.  Robley Fries, PhD [name], PhD LP Clinical Psychologist, [facility] [address] [place], [place] [address] [phone]

## 2020-07-26 ENCOUNTER — Ambulatory Visit (INDEPENDENT_AMBULATORY_CARE_PROVIDER_SITE_OTHER): Payer: 59 | Admitting: Adult Health

## 2020-07-26 ENCOUNTER — Other Ambulatory Visit: Payer: Self-pay

## 2020-07-26 ENCOUNTER — Encounter: Payer: Self-pay | Admitting: Adult Health

## 2020-07-26 DIAGNOSIS — F331 Major depressive disorder, recurrent, moderate: Secondary | ICD-10-CM

## 2020-07-26 DIAGNOSIS — F900 Attention-deficit hyperactivity disorder, predominantly inattentive type: Secondary | ICD-10-CM

## 2020-07-26 DIAGNOSIS — F1910 Other psychoactive substance abuse, uncomplicated: Secondary | ICD-10-CM | POA: Diagnosis not present

## 2020-07-26 DIAGNOSIS — F411 Generalized anxiety disorder: Secondary | ICD-10-CM | POA: Diagnosis not present

## 2020-07-26 MED ORDER — ATOMOXETINE HCL 40 MG PO CAPS: 40.0000 mg | ORAL_CAPSULE | Freq: Every day | ORAL | 2 refills | Status: DC

## 2020-07-26 MED ORDER — ATOMOXETINE HCL 25 MG PO CAPS: 25.0000 mg | ORAL_CAPSULE | Freq: Every day | ORAL | 0 refills | Status: DC

## 2020-07-26 NOTE — Progress Notes (Signed)
Louis [name] [id number] 1995/03/07 24 y.o.  Subjective:   Patient ID:  Louis [name] is a 25 y.o. (DOB [birth year]) male.  Chief Complaint: No chief complaint on file.   HPI Louis [name] presents to the office today for follow-up of MDD, ADHD, GAD, polysubstance abuse.  Accompanied by mother - also contributing to interview.  HPI:   Describes mood today as "ok". Pleasant. Flat. Mood symptoms - reports depression, anxiety, and irritability - "sometimes". Denies recent panic attacks. Currently taking Lexapro at 20mg  and feels like it is helpful. Is wanting to try medication for ADHD symptoms. Has historically taken Adderall. Wanting to explore the non-stimulant medication for effectiveness. Genesight testing indicating as a genetically positive option to manage ADHD symptoms. Both patient and mother in agreeable to trial. Attending family functions. Getting out with friends. Plans to return to academic setting in January.  Improved interest and motivation. Taking medications as prescribed.  Energy levels low. Active, has a regular exercise routine. Working with a February. Enjoys some usual interests and activities. Single. Not dating. Lives with parents. Has a cat. Spending time with family and friends. Appetite adequate. Weight stable.. Sleeps well most nights. Averages 7 to 8 hours. Focus and concentration difficulties. Diagnosed with ADHD at age 4 years - CAS. Symptoms present since second grade. 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 Completing tasks. Managing aspects of household. Working as a [employer] currently - 30 hours. Denies SI or HI.  Denies AH or VH. Substance use - nicotine vaping. History of THC. History of ETOH use - elevated LFT's. Following up with Dr. Financial risk analyst at [facility].  Previous medication trials: Prozac, Wellbutrin, Vyvanse, Adderall    PHQ2-9    Flowsheet Row Counselor from 05/03/2020 in [facility]  PHQ-2 Total Score 5  PHQ-9 Total Score 20        Review of Systems:  Review of Systems  Musculoskeletal:  Negative for gait problem.  Neurological:  Negative for tremors.  Psychiatric/Behavioral:         Please refer to HPI   Medications: I have reviewed the patient's current medications.  Current Outpatient Medications  Medication Sig Dispense Refill   atomoxetine (STRATTERA) 25 MG capsule Take 1 capsule (25 mg total) by mouth daily. 14 capsule 0   atomoxetine (STRATTERA) 40 MG capsule Take 1 capsule (40 mg total) by mouth daily. 30 capsule 2   escitalopram (LEXAPRO) 20 MG tablet Take 1 tablet by mouth daily.     No current facility-administered medications for this visit.    Medication Side Effects: None  Allergies: No Known Allergies  Past Medical History:  Diagnosis Date   ADHD     Past Medical History, Surgical history, Social history, and Family history were reviewed and updated as appropriate.   Please see review of systems for further details on the patient's review from today.   Objective:   Physical Exam:  There were no vitals taken for this visit.  Physical Exam Constitutional:      General: He is not in acute distress. Musculoskeletal:        General: No deformity.  Neurological:     Mental Status: He is alert and oriented to person, place, and time.     Coordination: Coordination normal.  Psychiatric:        Attention and Perception: Attention and perception normal. He does not perceive auditory or visual hallucinations.  Mood and Affect: Mood normal. Mood is not anxious or depressed. Affect is not labile, blunt, angry or inappropriate.        Speech: Speech normal.        Behavior: Behavior normal.        Thought Content: Thought content normal. Thought content is not paranoid or delusional. Thought content does not include homicidal or suicidal ideation. Thought content does not include homicidal or suicidal plan.         Cognition and Memory: Cognition and memory normal.        Judgment: Judgment normal.     Comments: Insight intact    Lab Review:  No results found for: NA, K, CL, CO2, GLUCOSE, BUN, CREATININE, CALCIUM, PROT, ALBUMIN, AST, ALT, ALKPHOS, BILITOT, GFRNONAA, GFRAA  No results found for: WBC, RBC, HGB, HCT, PLT, MCV, MCH, MCHC, RDW, LYMPHSABS, MONOABS, EOSABS, BASOSABS  No results found for: POCLITH, LITHIUM   No results found for: PHENYTOIN, PHENOBARB, VALPROATE, CBMZ   .res Assessment: Plan:    Plan:  PDMP reviewed  Continue Lexapro 20mg  daily  Add Stratera 25mg  daily x 7 days, then increase to 40mg  daily.  Working with Dr testing results given for review.  Has ceased substance use.  RTC 4 weeks  Patient advised to contact office with any questions, adverse effects, or acute worsening in signs and symptoms.    Diagnoses and all orders for this visit:  Major depressive disorder, recurrent episode, moderate (HCC)  Generalized anxiety disorder  Attention deficit hyperactivity disorder (ADHD), predominantly inattentive type -     atomoxetine (STRATTERA) 25 MG capsule; Take 1 capsule (25 mg total) by mouth daily. -     atomoxetine (STRATTERA) 40 MG capsule; Take 1 capsule (40 mg total) by mouth daily.  Polysubstance abuse (HCC)    Please see After Visit Summary for patient specific instructions.  Future Appointments  Date Time Provider Department Center  08/03/2020  4:00 PM , PhD CP-CP None  08/09/2020  5:00 PM 08/05/2020, PhD CP-CP None  08/23/2020  6:00 PM 10/09/2020, PhD CP-CP None  08/30/2020  5:00 PM 08/25/2020, PhD CP-CP None  09/06/2020  5:20 PM Inge Waldroup, 09/01/2020, NP CP-CP None  09/06/2020  6:00 PM 09/08/2020, PhD CP-CP None  09/13/2020  5:00 PM Mitchum, 09/08/2020, PhD CP-CP None    No orders of the defined types were placed in this encounter.   -------------------------------

## 2020-08-03 ENCOUNTER — Other Ambulatory Visit: Payer: Self-pay

## 2020-08-03 ENCOUNTER — Ambulatory Visit (INDEPENDENT_AMBULATORY_CARE_PROVIDER_SITE_OTHER): Payer: 59 | Admitting: Psychiatry

## 2020-08-03 DIAGNOSIS — F411 Generalized anxiety disorder: Secondary | ICD-10-CM

## 2020-08-03 DIAGNOSIS — F1910 Other psychoactive substance abuse, uncomplicated: Secondary | ICD-10-CM | POA: Diagnosis not present

## 2020-08-03 DIAGNOSIS — F331 Major depressive disorder, recurrent, moderate: Secondary | ICD-10-CM

## 2020-08-03 DIAGNOSIS — F900 Attention-deficit hyperactivity disorder, predominantly inattentive type: Secondary | ICD-10-CM

## 2020-08-03 DIAGNOSIS — F4010 Social phobia, unspecified: Secondary | ICD-10-CM

## 2020-08-03 NOTE — Progress Notes (Signed)
Psychotherapy Progress Note [facility] [name], PhD LP  Patient ID: Khaidyn Staebell     MRN: [id number] Therapy format: Family therapy w/ patient -- accompanied by [name] Date: 08/03/2020      Start: 4:15p     Stop: 5:05p     Time Spent: 50 min Location: In-person   Session narrative (presenting needs, interim history, self-report of stressors and symptoms, applications of prior therapy, status changes, and interventions made in session) Regressed in waking himself up, has had a few days when he would have slept through work time.  Mother was going to hold back and let Darnel face his own consequences, but father overrode and woke him up, apparently in frustration with Jamason not living into greater responsibility.  Suggested parental anxiety whether he will create hardships for himself, and apparently in sympathy for [name], whose anxiety to make sure Orel matures becomes an urgent matter or her husband to rescue.    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  Revelations in the process about how his father struggled in adolescence before hitting socioeconomic success, how Alvar can relate to him.  Therapeutic modalities: Cognitive Behavioral Therapy and Solution-Oriented/Positive Psychology  Mental Status/Observations:  Appearance:   Casual     Behavior:  Appropriate  Motor:  Normal  Speech/Language:   Clear and Coherent  Affect:  Appropriate, more open at times  Mood:  angry and dysthymic  Thought process:  normal  Thought content:    WNL  Sensory/Perceptual disturbances:    WNL  Orientation:  Fully oriented  Attention:  Good    Concentration:  Good  Memory:  WNL  Insight:    Good  Judgment:   Good  Impulse Control:  Good   Risk Assessment: Danger to Self: No Self-injurious Behavior: No Danger to  Others: No Physical Aggression / Violence: No Duty to Warn: No Access to Firearms a concern: No  Assessment of progress:  stabilized  Diagnosis:   ICD-10-CM   1. Major depressive disorder, recurrent episode, moderate (HCC)  F33.1     2. Generalized anxiety disorder  F41.1     3. Attention deficit hyperactivity disorder (ADHD), predominantly inattentive type  F90.0     4. Polysubstance abuse (HCC)  F19.10     5. Social anxiety disorder  F40.10      Plan:  Redouble efforts to let Hazel do his own sleep management, face his own consequences if late for others.  Open to father joining if interested. Continue nutrition, exercise recommendations as above Continue to advocate calorie curfew, reasonable bedtime, and light control beforehand Still need to find out conditions for return to school Other recommendations/advice as may be noted above Continue to utilize previously learned skills ad lib Maintain medication as prescribed and work faithfully with relevant prescriber(s) if any changes are desired or seem indicated Call the clinic on-call service, present to ER, or call 911 if any life-threatening psychiatric crisis Return 1-2 wks. Already scheduled visit in this office 08/09/2020.  Robley Fries, PhD [name], PhD LP Clinical Psychologist, [facility] [address], [address] [place], [place] [address] [phone]

## 2020-08-09 ENCOUNTER — Other Ambulatory Visit: Payer: Self-pay

## 2020-08-09 ENCOUNTER — Ambulatory Visit (INDEPENDENT_AMBULATORY_CARE_PROVIDER_SITE_OTHER): Payer: 59 | Admitting: Psychiatry

## 2020-08-09 DIAGNOSIS — F900 Attention-deficit hyperactivity disorder, predominantly inattentive type: Secondary | ICD-10-CM

## 2020-08-09 DIAGNOSIS — F1910 Other psychoactive substance abuse, uncomplicated: Secondary | ICD-10-CM | POA: Diagnosis not present

## 2020-08-09 DIAGNOSIS — F331 Major depressive disorder, recurrent, moderate: Secondary | ICD-10-CM

## 2020-08-09 DIAGNOSIS — F4010 Social phobia, unspecified: Secondary | ICD-10-CM

## 2020-08-09 DIAGNOSIS — F411 Generalized anxiety disorder: Secondary | ICD-10-CM

## 2020-08-09 NOTE — Progress Notes (Signed)
Psychotherapy Progress Note [facility] [name], PhD LP  Patient ID: Louis [name]     MRN: [id number] Therapy format: Family therapy w/ patient -- accompanied by mother Louis [name] Date: 08/09/2020      Start: 5:14p     Stop: 6:03p     Time Spent: 49 min Location: In-person   Session narrative (presenting needs, interim history, self-report of stressors and symptoms, applications of prior therapy, status changes, and interventions made in session) Two days, since last seen, in which Louis [name] was successfully left to his own to get himself up and neither father nor mother got involved to wake him up.  Affirmed and encouraged in these boundaries.  Strattera does seem to be helping with alertness and working memory, not long enough to tell about anxiety.  Dose increase in couple days.  Med check in 4 weeks.  No SE noted except less appetite.  Night eating is reduced, and he is doing some better about cleaning up food debris.  Affirmed and encouraged.  Wed-Thurs is working out to be his off time from work, so going to [place] to visit friends then.  Have made transition from a lot of credit card spending and weekly allowance to paying his own gas out of his own pay.  Affirmed as fair and adult adjustment in family support and respect of growing adulthood.  Still difficult keeping to a circadian rhythm.  Bedtimes and up times can vary by several hours still.  Encouraged rein that in for better mood, energy, and distress tolerance.  Decided at this point he is not going back to school for fall semester, still hopes to return in January.  Advised to begin thinking about "simulated school" -- what, if anything, might be a way of practicing small for conditions at school.  Consider possibility of a course through community college this fall, or a subject he is interested in and commit to some regular reading, learning time.  Therapeutic modalities: Cognitive Behavioral Therapy and  Solution-Oriented/Positive Psychology  Mental Status/Observations:  Appearance:   Casual     Behavior:  Appropriate  Motor:  Normal  Speech/Language:   Clear and Coherent  Affect:  Appropriate, Constricted, and less so  Mood:  anxious and dysthymic  Thought process:  normal  Thought content:    WNL  Sensory/Perceptual disturbances:    WNL  Orientation:  Fully oriented  Attention:  Good    Concentration:  Good  Memory:  WNL  Insight:    Good  Judgment:   Good  Impulse Control:  Good   Risk Assessment: Danger to Self: No Self-injurious Behavior: No Danger to Others: No Physical Aggression / Violence: No Duty to Warn: No Access to Firearms a concern: No  Assessment of progress:  progressing, partial  Diagnosis:   ICD-10-CM   1. Major depressive disorder, recurrent episode, moderate (HCC)  F33.1     2. Generalized anxiety disorder  F41.1     3. Attention deficit hyperactivity disorder (ADHD), predominantly inattentive type  F90.0     4. Polysubstance abuse (HCC)  F19.10     5. Social anxiety disorder  F40.10      Plan:  Consider taking a course or committed self-study Still get specifics from school on return process Rein in circadian variability Continue to advocate abstinence from [facility] and vape, be willing to see that friends will not pressure to use if he abstains Continue diet & exercise measures as able Parents continue hands-off approach to policing  his sleep/wake Continue to pick up, clean up reasonably in room, respecting house standard Other recommendations/advice as may be noted above Continue to utilize previously learned skills ad lib Maintain medication as prescribed and work faithfully with relevant prescriber(s) if any changes are desired or seem indicated Call the clinic on-call service, present to ER, or call 911 if any life-threatening psychiatric crisis Return in about 2 weeks (around 08/23/2020). Already scheduled visit in this office  08/23/2020.  Robley Fries, PhD [name], PhD LP Clinical Psychologist, [facility] [address], [address] [place], [place] [address] [phone]

## 2020-08-23 ENCOUNTER — Ambulatory Visit (INDEPENDENT_AMBULATORY_CARE_PROVIDER_SITE_OTHER): Payer: 59 | Admitting: Psychiatry

## 2020-08-23 ENCOUNTER — Other Ambulatory Visit: Payer: Self-pay

## 2020-08-23 DIAGNOSIS — F1910 Other psychoactive substance abuse, uncomplicated: Secondary | ICD-10-CM

## 2020-08-23 DIAGNOSIS — F900 Attention-deficit hyperactivity disorder, predominantly inattentive type: Secondary | ICD-10-CM

## 2020-08-23 DIAGNOSIS — F331 Major depressive disorder, recurrent, moderate: Secondary | ICD-10-CM | POA: Diagnosis not present

## 2020-08-23 DIAGNOSIS — F411 Generalized anxiety disorder: Secondary | ICD-10-CM

## 2020-08-23 DIAGNOSIS — F4010 Social phobia, unspecified: Secondary | ICD-10-CM | POA: Diagnosis not present

## 2020-08-23 NOTE — Progress Notes (Signed)
Psychotherapy Progress Note [facility] Louis Czar, PhD LP  Patient ID: Louis [name]     MRN: [id number] Therapy format: Family therapy w/ patient -- accompanied by Louis [name] and Louis [name] Date: 08/23/2020      Start: 6:10p     Stop: 7:10p     Time Spent: 60 min Location: In-person   Session narrative (presenting needs, interim history, self-report of stressors and symptoms, applications of prior therapy, status changes, and interventions made in session) Individually, still pursuing the project to let [place] handle his own waking up.  Still has variable sleep schedule depending on work hours and obligations, does admit variable mood, and some variable clarity as ar result of variable sleep.    Louis [name] sees improvement coming off Adderall, less zombie behavior.  He is concerned for seeming depression more the past few weeks, may be pulling back from the family more.  Notes he tends to differ with Louis [name] about how detailed her demanding to get in pushing Louis [name] to do things.  He would like to see more progress in health habits including nutrition, sleep management, getting outdoors.  Poignantly notes seeing Louis [name] repeat his own path with depression and self doubt, and vulnerably reveals that he has had times in his life where he himself felt suicidal.  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  For his part, Louis [name] agrees, and agrees that social media has affected him adversely in his own life.  Louis [name] states his worry that Louis [name] cannot seem to turn the corner and habit change and worries he will become demoralized.  Acknowledged that the events that led Louis [name] were a time of being demoralized but stuck with limited ideas of how he could cope and address anxiety and  frustration.  Also acknowledged by Louis [name] that marijuana has damaged him and confides that hangovers from pot use have also been to his detriment.  Getting clearer that, what ever high or relaxation it delivers, it does take back more after it wears off in wellbeing.  Discussed hopes, aspirations, and standards of parents, with which Louis [name] agrees.  Louis [name] out acknowledged differences in parenting comfort and approach, confirming father's empathy and relating to adolescent struggle to organize and mature handling his own responsibilities.  Mutual compassion for mother, who will worry beyond all the rest of the family and perhaps work the hardest emotionally restraining tendencies to worry and intervene with suggestions and lobbying Louis [name] to do or try things.    Touched on college plans, acknowledging that Louis [name] is not the only option available but that Louis [name] does intend to resume in January, and the parents will support it and back him financially if he can organize to make that happen.  With that goal in mind, I agreed to shift focus to working up ways of getting unstuck in his work and ways of coaching himself through uncomfortable feelings feeling stuck with schoolwork.  Agreed to schedule more individually focused time, with family check-in's about every third session for accountability and guidance supporting his recovery work.  Therapeutic modalities: Cognitive Behavioral Therapy and Solution-Oriented/Positive Psychology  Mental Status/Observations:  Appearance:   Casual     Behavior:  Appropriate  Motor:  Normal  Speech/Language:   Clear and Coherent  Affect:  Appropriate  Mood:  anxious and dysthymic  Thought process:  normal  Thought content:    WNL  Sensory/Perceptual disturbances:    WNL  Orientation:  Fully oriented  Attention:  Good    Concentration:  Good  Memory:  WNL  Insight:    Good  Judgment:   Good  Impulse Control:  Fair   Risk Assessment: Danger to Self:  No Self-injurious Behavior: No Danger to Others: No Physical Aggression / Violence: No Duty to Warn: No Access to Firearms a concern: No  Assessment of progress:  progressing  Diagnosis:   ICD-10-CM   1. Major depressive disorder, recurrent episode, moderate (HCC)  F33.1     2. Generalized anxiety disorder  F41.1     3. Attention deficit hyperactivity disorder (ADHD), predominantly inattentive type  F90.0     4. Social anxiety disorder  F40.10     5. Polysubstance abuse (HCC)  F19.10      Plan:  Keep working on better regulating sleep-wake times Parents maintain commitment to let him self-manage sleep/wake Refocus as able on handling schoolwork and getting unstuck Do the factfinding for what it takes to return to [facility] [employer] [facility] or enroll locally Continue trying to regulate circadian pattern Continue working nutritional balance, calorie curfew, protein priority Other recommendations/advice as may be noted above Continue to utilize previously learned skills ad lib Maintain medication as prescribed and work faithfully with relevant prescriber(s) if any changes are desired or seem indicated Call the clinic on-call service, present to ER, or call 911 if any life-threatening psychiatric crisis Return 1-2 wks. Already scheduled visit in this office 08/30/2020.  Robley Fries, PhD Louis Czar, PhD LP Clinical Psychologist, [facility] [address], [address] [place], [place] [address] [phone]

## 2020-08-30 ENCOUNTER — Other Ambulatory Visit: Payer: Self-pay

## 2020-08-30 ENCOUNTER — Ambulatory Visit (INDEPENDENT_AMBULATORY_CARE_PROVIDER_SITE_OTHER): Payer: 59 | Admitting: Psychiatry

## 2020-08-30 DIAGNOSIS — F4010 Social phobia, unspecified: Secondary | ICD-10-CM | POA: Diagnosis not present

## 2020-08-30 DIAGNOSIS — F1910 Other psychoactive substance abuse, uncomplicated: Secondary | ICD-10-CM

## 2020-08-30 DIAGNOSIS — F900 Attention-deficit hyperactivity disorder, predominantly inattentive type: Secondary | ICD-10-CM

## 2020-08-30 DIAGNOSIS — F411 Generalized anxiety disorder: Secondary | ICD-10-CM

## 2020-08-30 DIAGNOSIS — F331 Major depressive disorder, recurrent, moderate: Secondary | ICD-10-CM | POA: Diagnosis not present

## 2020-08-30 NOTE — Progress Notes (Signed)
Psychotherapy Progress Note [facility] [name], PhD LP  Patient ID: Louis [name]     MRN: [id number] Therapy format: Individual psychotherapy Date: 08/30/2020      Start: 5:10p     Stop: 6:00p     Time Spent: 50 min Location: In-person   Session narrative (presenting needs, interim history, self-report of stressors and symptoms, applications of prior therapy, status changes, and interventions made in session) Sleep still not well-managed.  Discussed up time, bed time, and resolved to call a 1am curfew from gaming and all other activities (lights out).  Has 8am up time for work days, 9am good enough for off days.  Pledge to try to stick with those targets, and if still wants to lounge, at least lounge or snooze out of bed.  Has overslept and been late to work 3 x now, in fact, but as predicted, no consequences.  Still embarrassing.  One time forgot his alarm, other times just up too late.  Job satisfaction and paycheck are still worth it and worth trying further.  Has learned some skills for being on the job, including some cooking.  Probed, denies any subconscious limit-testing.    Strattera has seemed to fulfill need for attention support, and he has experienced some appetite loss from it.  Falling into late-day eating, e.g., 4pm before he gets first calories.  Sometimes will grab fast food in the morning, though a biscuit tends to make him nauseous.  Further encouraged to prioritize morning protein, even if it means a chicken biscuit, lose the biscuit.  Concern for parents' tone now in expressing concerns for his "progress".  They tend to put things ominously, he feels, which bothers him.  Discussed emotionally inaccurate communication, translating benevolently when they do, and tactics for helping them clarify.  Suggested that a lot they may have to say actually reduces to "I'm just worried for you.  Can you show me what I can be reassured by?"  Also addressed assertive  ways to contend with helicoptering.  Therapeutic modalities: Cognitive Behavioral Therapy and Solution-Oriented/Positive Psychology  Mental Status/Observations:  Appearance:   Casual     Behavior:  Appropriate  Motor:  Normal  Speech/Language:   Clear and Coherent  Affect:  Appropriate, freer one on one  Mood:  dysthymic  Thought process:  normal  Thought content:    WNL  Sensory/Perceptual disturbances:    WNL  Orientation:  Fully oriented  Attention:  Good    Concentration:  Good  Memory:  WNL  Insight:    Good  Judgment:   Good  Impulse Control:  Fair   Risk Assessment: Danger to Self: No Self-injurious Behavior: No Danger to Others: No Physical Aggression / Violence: No Duty to Warn: No Access to Firearms a concern: No  Assessment of progress:  progressing  Diagnosis:   ICD-10-CM   1. Major depressive disorder, recurrent episode, moderate (HCC)  F33.1     2. Generalized anxiety disorder  F41.1     3. Attention deficit hyperactivity disorder (ADHD), predominantly inattentive type  F90.0     4. Social anxiety disorder  F40.10     5. Polysubstance abuse (HCC)  F19.10      Plan:  1am lights out, any night.  Preferably earlier for earlier commitments. Recommend calorie curfew -- 11pm reliably Recommend cut wheat out of the morning, prioritize protein.  E.g., set up boiled eggs as a grab food, or protein bars. Try addressing Mom's helicoptering with "I hear  you" and an offer to show how Return to school readiness and academic management issues once self-care improves.  Still find out what it takes to go back to school. Other recommendations/advice as may be noted above Continue to utilize previously learned skills ad lib Maintain medication as prescribed and work faithfully with relevant prescriber(s) if any changes are desired or seem indicated Call the clinic on-call service, present to ER, or call 911 if any life-threatening psychiatric crisis No follow-ups on  file. Already scheduled visit in this office 09/06/2020.  Louis Fries, PhD [name], PhD LP Clinical Psychologist, [facility] [address], [address] [place], [place] [address] [phone]

## 2020-09-06 ENCOUNTER — Other Ambulatory Visit: Payer: Self-pay

## 2020-09-06 ENCOUNTER — Ambulatory Visit (INDEPENDENT_AMBULATORY_CARE_PROVIDER_SITE_OTHER): Payer: 59 | Admitting: Adult Health

## 2020-09-06 ENCOUNTER — Ambulatory Visit (INDEPENDENT_AMBULATORY_CARE_PROVIDER_SITE_OTHER): Payer: 59 | Admitting: Psychiatry

## 2020-09-06 DIAGNOSIS — F4010 Social phobia, unspecified: Secondary | ICD-10-CM

## 2020-09-06 DIAGNOSIS — F331 Major depressive disorder, recurrent, moderate: Secondary | ICD-10-CM

## 2020-09-06 DIAGNOSIS — F900 Attention-deficit hyperactivity disorder, predominantly inattentive type: Secondary | ICD-10-CM

## 2020-09-06 DIAGNOSIS — F411 Generalized anxiety disorder: Secondary | ICD-10-CM | POA: Diagnosis not present

## 2020-09-06 DIAGNOSIS — F1910 Other psychoactive substance abuse, uncomplicated: Secondary | ICD-10-CM

## 2020-09-06 NOTE — Progress Notes (Signed)
Louis [name] [id number] [birth year] 24 y.o.  Subjective:   Patient ID:  Louis [name] is a 25 y.o. (DOB 12/04/1995) male.  Chief Complaint: No chief complaint on file.   HPI Louis [name] presents to the office today for follow-up of MDD, ADHD, GAD, polysubstance abuse.  Accompanied by mother - also contributing to interview.  HPI:   Describes mood today as "ok". Pleasant. Flat. Mood symptoms - reports depression, anxiety, and irritability - "at times". Denies recent panic attacks. Currently taking Lexapro at 20mg  and feels it is helpful to manage mood. Started the 25mg  and has increased to 40mg  daily and feels like it has helped some. Stating "it's doing something". Also reporting the [employer] is effecting his appetite. Denies weight loss. Has been taking in the mornings with no untoward side effects. Varying interest and motivation. Taking medications as prescribed.  Energy levels low. Active, has a regular exercise routine. Working with a . Enjoys some usual interests and activities. Single. Not dating. Lives with parents. Has a cat. Spending time with family and friends. Appetite decreased. Weight stable. Sleeps well most nights. Averages 7 to 8 hours. Focus and concentration difficulties. Diagnosed with ADHD at age 36 years - CAS. Symptoms present since second grade. Has attended [employer] for 8 semesters - computer science - currently on a medical leave. Completing tasks. Managing aspects of household. 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 Denies SI or HI.  Denies AH or VH. Substance use - nicotine vaping. History of THC. History of ETOH use - elevated LFT's. Following up with Dr. 18 at [facility].  Previous medication trials: Prozac, Wellbutrin, Vyvanse, Adderall    PHQ2-9    Flowsheet Row Counselor from 05/03/2020 in [facility]  PHQ-2 Total Score 5  PHQ-9 Total Score 20        Review of Systems:  Review of Systems   Musculoskeletal:  Negative for gait problem.  Neurological:  Negative for tremors.  Psychiatric/Behavioral:         Please refer to HPI   Medications: I have reviewed the patient's current medications.  Current Outpatient Medications  Medication Sig Dispense Refill   atomoxetine (STRATTERA) 25 MG capsule Take 1 capsule (25 mg total) by mouth daily. 14 capsule 0   atomoxetine (STRATTERA) 40 MG capsule Take 1 capsule (40 mg total) by mouth daily. 30 capsule 2   escitalopram (LEXAPRO) 20 MG tablet Take 1 tablet by mouth daily.     No current facility-administered medications for this visit.    Medication Side Effects: None  Allergies: No Known Allergies  Past Medical History:  Diagnosis Date   ADHD     Past Medical History, Surgical history, Social history, and Family history were reviewed and updated as appropriate.   Please see review of systems for further details on the patient's review from today.   Objective:   Physical Exam:  There were no vitals taken for this visit.  Physical Exam Constitutional:      General: He is not in acute distress. Musculoskeletal:        General: No deformity.  Neurological:     Mental Status: He is alert and oriented to person, place, and time.     Coordination: Coordination normal.  Psychiatric:        Attention and Perception: Attention and perception normal. He does not perceive auditory or visual hallucinations.        Mood and Affect: Mood normal. Mood is not anxious or depressed.  Affect is not labile, blunt, angry or inappropriate.        Speech: Speech normal.        Behavior: Behavior normal.        Thought Content: Thought content normal. Thought content is not paranoid or delusional. Thought content does not include homicidal or suicidal ideation. Thought content does not include homicidal or suicidal plan.        Cognition and Memory: Cognition and memory normal.        Judgment: Judgment normal.     Comments: Insight  intact    Lab Review:  No results found for: NA, K, CL, CO2, GLUCOSE, BUN, CREATININE, CALCIUM, PROT, ALBUMIN, AST, ALT, ALKPHOS, BILITOT, GFRNONAA, GFRAA  No results found for: WBC, RBC, HGB, HCT, PLT, MCV, MCH, MCHC, RDW, LYMPHSABS, MONOABS, EOSABS, BASOSABS  No results found for: POCLITH, LITHIUM   No results found for: PHENYTOIN, PHENOBARB, VALPROATE, CBMZ   .res Assessment: Plan:     Plan:  PDMP reviewed  Continue Lexapro 20mg  daily  Continue Stratera 40mg  daily - move to bedtime..  Working with Dr testing results given for review.  Has ceased substance use.  RTC 4 weeks  Patient advised to contact office with any questions, adverse effects, or acute worsening in signs and symptoms.  Diagnoses and all orders for this visit:  Major depressive disorder, recurrent episode, moderate (HCC)  Generalized anxiety disorder  Attention deficit hyperactivity disorder (ADHD), predominantly inattentive type  Social anxiety disorder    Please see After Visit Summary for patient specific instructions.  Future Appointments  Date Time Provider Department Center  09/20/2020  9:00 AM Gabrielle Dare, PhD [facility] None  09/27/2020 10:00 AM Mitchum, Robley Fries, PhD [facility] None    No orders of the defined types were placed in this encounter.   -------------------------------

## 2020-09-06 NOTE — Progress Notes (Signed)
Psychotherapy Progress Note [facility] [name], PhD LP  Patient ID: Louis [name]     MRN: [id number] Therapy format: Individual psychotherapy Date: 09/06/2020      Start: 6:19p     Stop: 7:07p     Time Spent: 48 min Location: In-person   Session narrative (presenting needs, interim history, self-report of stressors and symptoms, applications of prior therapy, status changes, and interventions made in session) Has decided he definitely does want to go back to school, in [place], in January.  Is clear that he does not want to go to class high, feels it will definitely help to be back in in-person class.  Feels the pandemic drove him to be much more shut in than it did others, so he would like to keep up with a job of some kind to pull him out of solitude when he does.  Parents would like some proof of readiness, e.g., just cleaning his room.  Not sure it fits well as a criterion, but he is making progress with other self-care, including calorie curfew (reliably done eating by 11pm).  Affirmed and encouraged.  Consulted with psychiatry, will try moving Strattera to evening on theory it will time side effects better.  May need further conversation with parents about what to show to get their confidence.  Encouraged to explore what may be criteria for support, or ways to show.  Discussed relapse prevention for the cycle of depression, smoking pot, procrastinating, and isolating.  Knows he is motivated by wanting to get his degree, wanting not to loiter with younger people, want to get on with working independently, wanting to prevent parents from having a failure to launch dilemma and face a need to kick him out.  Good reasons to make it [employer] as opposed to other choices include high ranking program nationally, good track record for hiring.  Discussed stress from coding assignments and experiences of being stuck without TA help due to the oversaturation of students.  Reveals  he did not get ADHD accommodations until 3rd year, and then only for extended time and low-population testing, but what he really needed was access to TAs.    Therapeutic modalities: Cognitive Behavioral Therapy and Solution-Oriented/Positive Psychology  Mental Status/Observations:  Appearance:   Casual     Behavior:  Appropriate  Motor:  Normal  Speech/Language:   Clear and Coherent  Affect:  Appropriate  Mood:  anxious, dysthymic, and better energy  Thought process:  normal  Thought content:    WNL  Sensory/Perceptual disturbances:    WNL  Orientation:  Fully oriented  Attention:  Good    Concentration:  Good  Memory:  WNL  Insight:    Good  Judgment:   Good  Impulse Control:  Good   Risk Assessment: Danger to Self: No Self-injurious Behavior: No Danger to Others: No Physical Aggression / Violence: No Duty to Warn: No Access to Firearms a concern: No  Assessment of progress:  progressing  Diagnosis:   ICD-10-CM   1. Major depressive disorder, recurrent episode, moderate (HCC)  F33.1     2. Generalized anxiety disorder  F41.1     3. Attention deficit hyperactivity disorder (ADHD), predominantly inattentive type  F90.0     4. Social anxiety disorder  F40.10     5. Polysubstance abuse (HCC)  F19.10      Plan:  Check school requirements and report back, learn how the TA situation works now Continue working on circadian regulation, morning protein  Maintain gains in calorie curfew Continue any appropriate sleep readiness routines Explore further what signs and proofs parents would like to see in order to feel optimistic about him going back to school.  If criteria for support, specify. Other recommendations/advice as may be noted above Continue to utilize previously learned skills ad lib Maintain medication as prescribed and work faithfully with relevant prescriber(s) if any changes are desired or seem indicated Call the clinic on-call service, present to ER, or call  911 if any life-threatening psychiatric crisis Return in about 2 weeks (around 09/20/2020). Already scheduled visit in this office 09/20/2020.  Robley Fries, PhD [name], PhD LP Clinical Psychologist, [facility] [address] [place], [place] [address] [phone]

## 2020-09-07 ENCOUNTER — Encounter: Payer: Self-pay | Admitting: Adult Health

## 2020-09-13 ENCOUNTER — Ambulatory Visit: Payer: 59 | Admitting: Psychiatry

## 2020-09-20 ENCOUNTER — Ambulatory Visit (INDEPENDENT_AMBULATORY_CARE_PROVIDER_SITE_OTHER): Payer: 59 | Admitting: Psychiatry

## 2020-09-20 ENCOUNTER — Other Ambulatory Visit: Payer: Self-pay

## 2020-09-20 DIAGNOSIS — F411 Generalized anxiety disorder: Secondary | ICD-10-CM | POA: Diagnosis not present

## 2020-09-20 DIAGNOSIS — F331 Major depressive disorder, recurrent, moderate: Secondary | ICD-10-CM

## 2020-09-20 DIAGNOSIS — S6992XS Unspecified injury of left wrist, hand and finger(s), sequela: Secondary | ICD-10-CM

## 2020-09-20 DIAGNOSIS — F900 Attention-deficit hyperactivity disorder, predominantly inattentive type: Secondary | ICD-10-CM | POA: Diagnosis not present

## 2020-09-20 DIAGNOSIS — F4010 Social phobia, unspecified: Secondary | ICD-10-CM

## 2020-09-20 DIAGNOSIS — F1910 Other psychoactive substance abuse, uncomplicated: Secondary | ICD-10-CM

## 2020-09-20 NOTE — Progress Notes (Signed)
Psychotherapy Progress Note [facility] [name], PhD LP  Patient ID: Louis [name]     MRN: [id number] Therapy format: Family therapy w/ patient -- accompanied by Louis [name] Date: 09/20/2020      Start: 9:22a     Stop: 10:10a     Time Spent: 48 min Location: In-person   Session narrative (presenting needs, interim history, self-report of stressors and symptoms, applications of prior therapy, status changes, and interventions made in session) Amputated part of his thumb at work, recovering now.    Discovery at home he is vaping again, admits he has been for a while.  Rule now no vape in the house, but he still does some.  Father is sensitive to the smell.  Tentatively investigating smoking cessation.  Until then, urged to abide by rules and take it outside at least.  Still/again eating late at night, taking food to his room, not really cleaning up room and car.    Re. criteria or signs for hope and support, mother wrote up a well-developed list covering self-care, care of his environment, learning.  Discussed and clarified intent.  Encouraged clarity about which are criteria for support, which are highest priority.  Claimed credit for improvement in personal hygiene, for taking encouragement to eat a bit more of home cooked food, and to shoulder responsibility cleaning.  Discussed communication in motivating further.  Special issue of whether praise would feel infantilizing or subtly controlling, encouraged more of a "What do you think?" Approach, and seek to confirm if Louis [name] himself likes the fruits of his own actions.  Is scheduled for 9/21 conversation now with [employer] admissions counselor to assess whether going there for a semester or a full transfer would make sense.  Affirmed and encouraged.  Therapeutic modalities: Cognitive Behavioral Therapy and Solution-Oriented/Positive Psychology  Mental Status/Observations:  Appearance:   Casual     Behavior:  Appropriate   Motor:  Normal  Speech/Language:   Clear and Coherent  Affect:  Appropriate  Mood:  dysthymic  Thought process:  normal  Thought content:    WNL  Sensory/Perceptual disturbances:    WNL  Orientation:  Fully oriented  Attention:  Good    Concentration:  Good   Memory:  WNL  Insight:    Good  Judgment:   Good  Impulse Control:  Fair   Risk Assessment: Danger to Self: No Self-injurious Behavior: No Danger to Others: No Physical Aggression / Violence: No Duty to Warn: No Access to Firearms a concern: No  Assessment of progress:  stabilized  Diagnosis:   ICD-10-CM   1. Major depressive disorder, recurrent episode, moderate (HCC)  F33.1     2. Generalized anxiety disorder  F41.1     3. Attention deficit hyperactivity disorder (ADHD), predominantly inattentive type  F90.0     4. Social anxiety disorder  F40.10     5. Polysubstance abuse (HCC)  F19.10     6. Injury of finger of left hand, sequela  S69.92XS      Plan:  Clarify motivation to quit nicotine.  Recommend making a comprehensive list of "good" reasons to quit or let it continue, consider it daily and "vote" for several days.  If motivated, engage a smoking cessation program sponsored by health insurance, or prioritize as a therapy goal.  Likely would need medical assistance (patch, gum, Wellbutrin) and accountability structure for obtaining the delivery system. Commit to fact-finding what it takes administratively to go back to [place] in January and  share with parents and [place] what the process and deadlines are. Recommit to circadian regulation, food curfew, and seeking morning protein Other recommendations/advice as may be noted above Continue to utilize previously learned skills ad lib Maintain medication as prescribed and work faithfully with relevant prescriber(s) if any changes are desired or seem indicated Call the clinic on-call service, 988/hotline, present to ER, or call 911 if any life-threatening  psychiatric crisis Return in about 1 week (around 09/27/2020). Already scheduled visit in this office 09/27/2020.  Robley Fries, PhD [name], PhD LP Clinical Psychologist, [facility] [address], [address] [place], [place] [address] [phone]

## 2020-09-25 ENCOUNTER — Ambulatory Visit (HOSPITAL_COMMUNITY)
Admission: EM | Admit: 2020-09-25 | Discharge: 2020-09-25 | Disposition: A | Discharge status: Home / Self Care | Payer: 59 | Attending: Psychiatry | Admitting: Psychiatry

## 2020-09-25 ENCOUNTER — Telehealth: Payer: Self-pay | Admitting: Psychiatry

## 2020-09-25 ENCOUNTER — Other Ambulatory Visit: Payer: Self-pay

## 2020-09-25 ENCOUNTER — Telehealth (HOSPITAL_COMMUNITY): Payer: Self-pay | Admitting: Psychiatry

## 2020-09-25 DIAGNOSIS — F331 Major depressive disorder, recurrent, moderate: Secondary | ICD-10-CM

## 2020-09-25 DIAGNOSIS — F411 Generalized anxiety disorder: Secondary | ICD-10-CM

## 2020-09-25 MED ORDER — HYDROXYZINE PAMOATE 25 MG PO CAPS: 25.0000 mg | ORAL_CAPSULE | Freq: Three times a day (TID) | ORAL | 0 refills | Status: DC | PRN

## 2020-09-25 NOTE — ED Provider Notes (Signed)
Behavioral Health Urgent Care Medical Screening Exam  Patient Name: Louis [name] MRN: [id number] Date of Evaluation: 09/25/20 Chief Complaint: Increased anxiety and depression Diagnosis:  Final diagnoses:  MDD (major depressive disorder), recurrent episode, moderate (HCC)  GAD (generalized anxiety disorder)    History of Present illness: Louis [name] is a 25 y.o. male patient presented to [facility] as a walk in  accompanied by his mother with complaints of "my anxiety has gotten so bad I cannot work".  Patient reports he has a psychiatric history of social anxiety, GAD, ADHD, and MDD.  Louis [name], 25 y.o., male patient seen face to face by this provider, consulted with Dr. Bronwen Betters; and chart reviewed on 09/25/20.  On evaluation Louis [name] reports he currently follows up with [facility], Arlee Muslim and therapist Robley Fries.  His current medications are Lexapro 20 mg for depression and Strattera for ADHD.  Reports he was talking to his therapist today and it was recommended that he present at the [facility] for assessment.  Patient reports this is the 1 year anniversary of his previous girlfriend breaking up with him and recently a new relationship came to an end.  Reports these events increase his anxiety and depression.  States last year his anxiety was so intense he had to withdrawal from [employer] [employer] and return home. 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  During evaluation Louis [name] is in sitting position in no acute distress.  He is fairly groomed.  He is anxious and tapping his foot on the floor during the evaluation.  He makes fleeting eye contact.  He is alert/oriented x 4 and cooperative.  Endorses increased anxiety and depression with congruent affect.  Reports feelings of worthlessness and wanting to self isolate.  Reports 7-8 hours of sleep per night.  Endorses a decrease in appetite, unsure of weight loss.  He speaking in a clear  tone at moderate volume,.  He is thought process is coherent and relevant; There is no indication that he is currently responding to internal/external stimuli or experiencing delusional thought content.  States his anxiety and depression are so intense that he endorses passive suicidal ideations at times.  Denies any thoughts of a plan, intent, or access to means.  Patient contracts for safety.  Patient is forward thinking.  He discusses how he would like to get his life back on track and reenter school.  Denies homicidal ideations.  Denies auditory and visual hallucinations.  Discussed patient's anxiety and depression.  Discussed facility based crisis center, patient declined admission.  States he does not want to be inpatient.  States he would like to handle his anxiety and depression through outpatient services.  Discussed and provided resources for [facility] behavioral outpatient PHP program (currently virtual).  Also provided resources for [facility] (which is in person).  Discussed hydroxyzine for anxiety.  Patient's mother who was present in the room is a Teacher, early years/pre.  States patient has a prescription of Klonopin he can take twice daily.She holds medications. States she does not like for patient to take it.  Patient agreed to try hydroxyzine for anxiety, educated patient on medication.  Explained hydroxyzine should not be taken with Klonopin.  Collateral: Mother states she does not have any immediate safety concerns with patient returning home.  States that she will monitor patient.  Reports patient has no access to any weapons or medications.  The suicide prevention education provided includes the following: Suicide risk  factors Suicide prevention and interventions National Suicide Hotline telephone number [facility] assessment telephone number [facility] Emergency Assistance 911, National line [address] and/or Residential Mobile Crisis Unit telephone number    Request made of mother to: Remove weapons (e.g., guns, rifles, knives), all items previously/currently identified as safety concern.   Remove drugs/medications (over the counter, prescriptions, illicit drugs), all items previously/currently identified as a safety concern.   At this time Louis [name] is educated and verbalizes understanding of mental health resources and other crisis services in the community.  He is instructed to call 911 and present to the nearest emergency room should he experience any suicidal/homicidal ideation, auditory/visual/hallucinations, or detrimental worsening of his mental health condition.   Psychiatric Specialty Exam  Presentation  General Appearance:Appropriate for Environment; Casual  Eye Contact:Fair  Speech:Clear and Coherent; Normal Rate  Speech Volume:Normal  Handedness:Left   Mood and Affect  Mood:Anxious; Depressed  Affect:Congruent   Thought Process  Thought Processes:Coherent  Descriptions of Associations:Intact  Orientation:Full (Time, Place and Person)  Thought Content:Logical    Hallucinations:None  Ideas of Reference:None  Suicidal Thoughts:Yes, Passive Without Intent; Without Plan; Without Means to Carry Out  Homicidal Thoughts:No   Sensorium  Memory:Immediate Good; Recent Good; Remote Good  Judgment:Good  Insight:Good   Executive Functions  Concentration:Good  Attention Span:Good  Recall:Good  Fund of Knowledge:Good  Language:Good   Psychomotor Activity  Psychomotor Activity:Normal   Assets  Assets:Communication Skills; Desire for Improvement; Financial Resources/Insurance; Housing; Leisure Time; Physical Health; Resilience; Social Support   Sleep  Sleep: No data recorded Number of hours: 7   No data recorded  Physical Exam: Physical Exam Vitals and nursing note reviewed.  Constitutional:      Appearance: Normal appearance. He is well-developed.  HENT:     Head: Normocephalic and  atraumatic.  Eyes:     General:        Right eye: No discharge.        Left eye: No discharge.     Conjunctiva/sclera: Conjunctivae normal.  Cardiovascular:     Rate and Rhythm: Normal rate.     Heart sounds: No murmur heard. Pulmonary:     Effort: Pulmonary effort is normal. No respiratory distress.  Musculoskeletal:        General: Normal range of motion.     Cervical back: Normal range of motion.  Skin:    General: Skin is warm and dry.  Neurological:     Mental Status: He is alert and oriented to person, place, and time.  Psychiatric:        Attention and Perception: Attention and perception normal.        Mood and Affect: Mood is anxious and depressed.        Speech: Speech normal.        Behavior: Behavior is cooperative.        Thought Content: Thought content includes suicidal ideation. Thought content does not include suicidal plan.        Cognition and Memory: Cognition normal.        Judgment: Judgment normal.   Review of Systems  Constitutional: Negative.  Negative for chills and fever.  HENT:  Negative for hearing loss.   Eyes: Negative.   Respiratory: Negative.  Negative for cough.   Cardiovascular: Negative.  Negative for chest pain.  Musculoskeletal: Negative.   Skin: Negative.   Neurological: Negative.   Psychiatric/Behavioral:  Positive for depression and suicidal ideas. The patient is nervous/anxious.   Blood  pressure 121/76, pulse 91, temperature 98.4 F (36.9 C), temperature source Oral, resp. rate 16, SpO2 99 %. There is no height or weight on file to calculate BMI.  Musculoskeletal: Strength & Muscle Tone: within normal limits Gait & Station: normal Patient leans: N/A   [facility] MSE Discharge Disposition for Follow up and Recommendations: Based on my evaluation the patient does not appear to have an emergency medical condition and can be discharged with resources and follow up care in outpatient services for Partial Hospitalization  Program.  Discharge patient  Referral made to IOP/PHP to [facility] behavioral health outpatient services.  Resources provided for PHP at [facility].  Hydroxyzine 25 mg PO TID PRN sent to patients pharmacy. No evidence of imminent risk to self or others at present.    Patient does not meet criteria for psychiatric inpatient admission. Discussed crisis plan, support from social network, calling 911, coming to the Emergency Department, and calling Suicide Hotline.   Ardis Hughs, NP 09/25/2020, 11:40 AM

## 2020-09-25 NOTE — Telephone Encounter (Signed)
D:  Louis Gambles, NP referred pt to MH-IOP.  A:  Placed call to orient pt.  Pt was on speakerphone so that his mother could listen.  According to pt's mother, she has a call out to [facility] also.  Reports being interested in that facility because they are in person.  Encouraged pt's mother to give the case manager a call if she decides to go with virtual for pt.  R:  Pt and mother receptive.

## 2020-09-25 NOTE — Discharge Instructions (Addendum)

## 2020-09-25 NOTE — Telephone Encounter (Addendum)
Pt's mom called.  He has been having anxiety attacks all weekend. She said he referenced suicide but said he wouldn't do that.  They have been with him all weekend. They called here thinking there was a doctor on call.  I provided her the phone numer for [facility] for future help after hours.  He is feeling very sad, crying, cant go to work.  Has had medicine increased by Almira Coaster a few weks ago, but wasn't sure if that might be involved.  She thinks Mardelle Matte, moreso than Almira Coaster is who he needs to talk to right now. She just asked that you call T[facility] back asap.  His number is [phone]  TC to [employer], found riding in car with mother, [name], free to talk.  Relates anniversary time of being broken up with by a girl he was very attached to, and now latest girlfriend told heim she just wants to be friends, set off a lot of pain and rumination about whether he will always be alone.  Feeling pressure as well about working out the return to school process.  Rise-and-fall anxiety attacks since Sat night.  Empathized with breakup and ruminating, assured he is not "alone for the rest of your life" material, validated OK to cry, verified will to live/nonsuicidal, and verified he has family he can let it out with, including crying openly when needed.    Plan to see as scheduled on Wed., have phone call with [facility] as scheduled on Wed., and make fact-finding call to [facility] when able, with clear framing taht it is only fact-finding, which will be taking care of the [facility] who doesn't know what to expect.  Validated that there are multiple school paths, even if each of them means meeting some new people.  Can resume on Wed.  [name], PhD LP Clinical Psychologist, [facility] [address], [address] [place], [place] [address] [phone]

## 2020-09-25 NOTE — ED Notes (Signed)
Discharge instructions provided and Pt stated understanding. Pt alert, orient and ambulatory prior to d/c from facility. Safety maintained.   

## 2020-09-27 ENCOUNTER — Other Ambulatory Visit: Payer: Self-pay

## 2020-09-27 ENCOUNTER — Ambulatory Visit (INDEPENDENT_AMBULATORY_CARE_PROVIDER_SITE_OTHER): Payer: 59 | Admitting: Psychiatry

## 2020-09-27 DIAGNOSIS — F1910 Other psychoactive substance abuse, uncomplicated: Secondary | ICD-10-CM | POA: Diagnosis not present

## 2020-09-27 DIAGNOSIS — F900 Attention-deficit hyperactivity disorder, predominantly inattentive type: Secondary | ICD-10-CM

## 2020-09-27 DIAGNOSIS — F411 Generalized anxiety disorder: Secondary | ICD-10-CM | POA: Diagnosis not present

## 2020-09-27 DIAGNOSIS — F331 Major depressive disorder, recurrent, moderate: Secondary | ICD-10-CM

## 2020-09-27 NOTE — Progress Notes (Signed)
Psychotherapy Progress Note [facility] Luan Moore, PhD LP  Patient ID: Louis [name]     MRN: [id number] Therapy format: Individual psychotherapy Date: 09/27/2020      Start: 10:15a     Stop: 11:05a     Time Spent: 50 min Location: In-person   Session narrative (presenting needs, interim history, self-report of stressors and symptoms, applications of prior therapy, status changes, and interventions made in session) Crisis calming.  Took clonazepam late in the day, saw [place] yesterday (day before yesterday, actually, acc EHR), was briefed on options for IOP, [facility] favored for being in-person).  Other option ([facility]?) but they are only online.  Either way intensive, and it would mean time off from work, but then he does want more intensive work on self-management in crisis, and he is losing attachment to the job for multiple reasons, including management creating more attention-management demands by putting staff on multiple stations.  Clarifies that he does not flaunt it, but he and his family are wealthy, and he does not need the job to afford his life, it is almost solely an activity to get out of the house for and be in the world instead of getting lost in his own room, thoughts, feelings, gaming, etc.  Agreed valuable that way, but so would a program if he chooses in favor of IOP.  Further benefit likely to group therapy program and acquaintance with other people dealing with similar issues.  Currently recovering from a severe cut to his thumb sustained on the job, suggested to be a byproduct of split attention.  Shares with TX the condition of his thumb after wrap and medical attention, asserts the first wrap was so tight it caused blisters.  Denies PTSD sxs re. the accident/injury, feels it is at this point just a condition to manage that will mean some scarring and minor change in appearance of his hand.  Takes responsibility for the injury, with only  complaint that management honored doctor's note about not using left thumb for "about 20 minutes".  Turned attention to crisis management, articulated needs, most likely for some kind of grounding skill in distress first.  Briefed on ice-holding, mindful attention to senses, safe place to cry, and journaling for catharsis and objectification of his automatic thoughts and feelings.  Agrees he could journal.  Recommend practicing mindful attention outside of crisis to make it more useful and fluent during.  Discussed insights and distressing thoughts going into the crisis, mainly had to do with that fear of being alone and the haunting of feeling the breakup from a year ago which, it turns out, drove his downturn through the year.  Delved into the importance of his attachments to Ephraim (recently, attractive but not necessarily a match, for personality and for the fact she has a child) and to Ovid Curd (met in h.s., friends until late 2019, began romantic relationship until she moved in 2021, came to feel neglected, and broke up over his apparent lack of attention.  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  After need to learn is satisfied, any further guilt/shame/regret is just brain patterns refreshing and  testing that learning.  Agrees he owned up to his dysfunction and neglect and made amends.  Still feels Ovid Curd is the best match he's met, and he would like to be back with her, but he accepts that she is moved on.  Probed unfinished emotional business, denies anything he needs to say or to hear with her to be OK.  Assured it is about getting new  experience, then, and making it real to himself that there is more than one good match in the world, and important opportunities still to come to show himself what he learned with each, and best sense to make of the shock last week was that it was going to happen with the first breakup after [place] regardless, even if he already wasn't thinking of a future with [place].  Re. school, has been considering [employer] more, sees [employer] more now as set up to still be oversaturated with students, plus a little haunting for having been there 4 years and come away under these conditions.  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  Seems much more valuable to have better access to teaching and tutoring, as he would have at [employer], and available information seems to say [employer] is more positioned to prepare people for work life.  Phone call today with [facility] transfer counselor.  Will also do some fact-finding with the department to know better what life in the intended program is like and whether pledges of more available teaching pan out in practice.  Affirmed and encouraged.  Therapeutic modalities: Cognitive Behavioral Therapy, Solution-Oriented/Positive Psychology, and Ego-Supportive  Mental Status/Observations:  Appearance:   Casual   bandaged thumb  Behavior:  Appropriate  Motor:  Normal  Speech/Language:   Clear and Coherent  Affect:  Constricted  Mood:  anxious and dysthymic  Thought process:  worrisome  Thought content:    WNL  Sensory/Perceptual disturbances:    WNL  Orientation:  Fully oriented  Attention:  Good    Concentration:  Good  Memory:  WNL  Insight:    Good  Judgment:   Good  Impulse Control:  Good   Risk Assessment: Danger to Self: No Self-injurious Behavior: No Danger to Others: No Physical Aggression / Violence: No Duty to Warn: No Access to Firearms a  concern: No  Assessment of progress:  progressing  Diagnosis:   ICD-10-CM   1. Major depressive disorder, recurrent episode, moderate (HCC)  F33.1     2. Generalized anxiety disorder  F41.1     3. Attention deficit hyperactivity disorder (ADHD), predominantly inattentive type  F90.0     4. Polysubstance abuse ([place])  F19.10      Plan:  Self-affirm that his susceptibility to panic is probably very limited -- has to involve re-evoking the old threat of failing out of relationships or school, at least one, and he is becoming too experienced handling struggles in both area for it to be catastrophic.  What remains is becoming more experienced an fluent in awareness, grounding, and self-management. Practice mindful awareness at times it is not needed to rescue anxiety Daily journaling -- uncertainties encountered today, how he responded Option to breathing, muscle relaxation skills  Option to engage IOP program for more intensive skill building Follow through factfinding for school decision Other recommendations/advice as may be noted above Continue to utilize previously learned  skills ad lib Maintain medication as prescribed and work faithfully with relevant prescriber(s) if any changes are desired or seem indicated Call the clinic on-call service, 988/hotline, present to ER, or call 911 if any life-threatening psychiatric crisis Return for session(s) already scheduled. Already scheduled visit in this office 10/04/2020.  Blanchie Serve, PhD Luan Moore, PhD LP Clinical Psychologist, [facility] [address] [place], [place] [address] [phone]

## 2020-10-04 ENCOUNTER — Ambulatory Visit (INDEPENDENT_AMBULATORY_CARE_PROVIDER_SITE_OTHER): Payer: 59 | Admitting: Psychiatry

## 2020-10-04 ENCOUNTER — Other Ambulatory Visit: Payer: Self-pay

## 2020-10-04 DIAGNOSIS — F900 Attention-deficit hyperactivity disorder, predominantly inattentive type: Secondary | ICD-10-CM

## 2020-10-04 DIAGNOSIS — F4010 Social phobia, unspecified: Secondary | ICD-10-CM | POA: Diagnosis not present

## 2020-10-04 DIAGNOSIS — F411 Generalized anxiety disorder: Secondary | ICD-10-CM

## 2020-10-04 DIAGNOSIS — F331 Major depressive disorder, recurrent, moderate: Secondary | ICD-10-CM

## 2020-10-04 NOTE — Progress Notes (Signed)
Psychotherapy Progress Note [facility] [name], PhD LP  Patient ID: Louis [name] [name])    MRN: [id number] Therapy format: Individual psychotherapy Date: 10/04/2020      Start: 2:10p     Stop: 3:00p     Time Spent: 50 min Location: In-person   Session narrative (presenting needs, interim history, self-report of stressors and symptoms, applications of prior therapy, status changes, and interventions made in session) Still feeling wiped out some from his panic episode.  Will be doing [facility] IOP for 6 weeks starting Oct. 10, put in notice with workplace to stop Oct 9.  Has noticed feeling better for making the decision, following more indications of confused management, pressure to go ahead and take risks with multiple attention and sharps, and overall how the people he is working with seem just not to have the same awareness of work ethic he does.  Affirmed as educational, not arrogant of him, and helpful toward future choices how to spend time and develop relationships.  Of course, not having the coworker interested any more un-sweetens the environment, but she was never going to be a longterm relationship with her situation.  Feels he has satisfied himself that he can get up, get out, show up, do his work, and circulate with other people without having to feel outcast or unsuitable, and working this job for this time has accomplished that.  Also remotivated to complete school.  Daily rhythms now, bedtime variable, appetite less.  Knows that night time up appeals to him -- knows he won't be interrupted, likes the darkness, and avoids   [employer] meeting deferred to today (6:30pm).  Still feels it would be helpful to get a fresh start, now that his classmates have passed him by, and now that he has seen enough lack of academic support at a large, overcrowded program that aims more for graduate training.  Wants to be in a terminal degree environment with better learning  support, believes [employer] fits the bill.  Rather than live at home, however, he would like to live in his own environment.  Discussed parameters, likely conditions for support, and how he does not want to impose on his parents to go 5th or 6th year support.  Encouraged he speak frankly with them about what he would like to do, what options he does see, and possibly frame a path to independent living, e.g., with a trial semester handling classes while living at home.    Encouraged also better framing a personal pledge for his share of housekeeping and something reasonable he can do for the common good there.  Discussed dynamics at home, framing ways to reduce the tension he feels around supervision and wishes expressed or unexpressed by mother.  Therapeutic modalities: Cognitive Behavioral Therapy, Solution-Oriented/Positive Psychology, and Motivational Interviewing  Mental Status/Observations:  Appearance:   Casual     Behavior:  Appropriate  Motor:  Normal  Speech/Language:   Clear and Coherent  Affect:  Appropriate  Mood:  anxious and less constricted , more responsive  Thought process:  normal  Thought content:    WNL  Sensory/Perceptual disturbances:    WNL  Orientation:  Fully oriented  Attention:  Good    Concentration:  Good  Memory:  WNL  Insight:    Good  Judgment:   Good  Impulse Control:  Good   Risk Assessment: Danger to Self: No Self-injurious Behavior: No Danger to Others: No Physical Aggression / Violence: No Duty to Warn: No  Access to Firearms a concern: No  Assessment of progress:  progressing  Diagnosis:   ICD-10-CM   1. Major depressive disorder, recurrent episode, moderate (HCC)  F33.1     2. Generalized anxiety disorder  F41.1     3. Attention deficit hyperactivity disorder (ADHD), predominantly inattentive type  F90.0     4. Social anxiety disorder  F40.10      Plan:  [employer] IOP as interested.  Aware of need to suspend OP service  while program is in session.  Verbal ROI given for coordination of care.. Accept resigning his work in a couple weeks -- has served its purposes, safety concerns are real, and he can do other things once IOP completes. 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 With mother -- option to schedule "office hours" for mom's questions and suggestions, explain the take-a-bite strategy for unblocking his own housekeeping Other recommendations/advice as may be noted above Continue to utilize previously learned skills ad lib Maintain medication as prescribed and work faithfully with relevant prescriber(s) if any changes are desired or seem indicated Call the clinic on-call service, 988/hotline, present to ER, or call 911 if any life-threatening psychiatric crisis Return for time at discretion, will need to [place] 10/10 through late November, RS after IOP. Already scheduled visit in this office 10/20/2020.  Robley Fries, PhD [name], PhD LP Clinical Psychologist, [facility] [address] [place], [place] [address] [phone]

## 2020-10-16 ENCOUNTER — Telehealth: Payer: Self-pay | Admitting: Psychiatry

## 2020-10-16 NOTE — Telephone Encounter (Addendum)
Admin note for non-service contact  Patient ID: Louis [name]  MRN: [id number] DATE: 10/16/2020  Call from [facility] liaison to establish coordination of care.  PT admitted to [facility] today.  Will send discharge summary at completion, provide TX contact to program therapist in c/o need.    ROI received by fax, [phone], from [employer], [phone].  Robley Fries, PhD [name], PhD LP Clinical Psychologist, [facility] [address], [address] [place], [place] [address] [phone]

## 2020-10-20 ENCOUNTER — Ambulatory Visit: Payer: 59 | Admitting: Psychiatry

## 2020-10-26 ENCOUNTER — Ambulatory Visit: Payer: 59 | Admitting: Psychiatry

## 2020-11-01 ENCOUNTER — Ambulatory Visit: Payer: 59 | Admitting: Psychiatry

## 2020-11-08 ENCOUNTER — Ambulatory Visit: Payer: 59 | Admitting: Psychiatry

## 2020-11-21 ENCOUNTER — Ambulatory Visit (INDEPENDENT_AMBULATORY_CARE_PROVIDER_SITE_OTHER): Payer: 59 | Admitting: Psychiatry

## 2020-11-21 ENCOUNTER — Other Ambulatory Visit: Payer: Self-pay

## 2020-11-21 DIAGNOSIS — F411 Generalized anxiety disorder: Secondary | ICD-10-CM | POA: Diagnosis not present

## 2020-11-21 DIAGNOSIS — F4010 Social phobia, unspecified: Secondary | ICD-10-CM

## 2020-11-21 DIAGNOSIS — F1910 Other psychoactive substance abuse, uncomplicated: Secondary | ICD-10-CM

## 2020-11-21 DIAGNOSIS — F331 Major depressive disorder, recurrent, moderate: Secondary | ICD-10-CM | POA: Diagnosis not present

## 2020-11-21 DIAGNOSIS — F900 Attention-deficit hyperactivity disorder, predominantly inattentive type: Secondary | ICD-10-CM | POA: Diagnosis not present

## 2020-11-21 NOTE — Progress Notes (Signed)
Psychotherapy Progress Note [facility] [name], PhD LP  Patient ID: Louis [name] [facility])    MRN: [id number] Therapy format: Individual psychotherapy Date: 11/21/2020      Start: 1:08p     Stop: 1:57p     Time Spent: 49 min Location: In-person   Session narrative (presenting needs, interim history, self-report of stressors and symptoms, applications of prior therapy, status changes, and interventions made in session) Been in IOP with [facility], learning about reframing his thinking.  Continues with online service.  Today was followup with hand doctor for his cut thumb.  Says therapist Brayton Caves will be in touch soon with summary and recommendations.  New appreciation for mindfulness, including a 5-senses grounding skill (observe 5 things you see, 4 you hear, etc) he found useful for reducing anxiety.  Using "opposite action" (kind of a self-activated "just do it" response) to some benefit, e.g., to overcome resistance to things like brushing his teeth, which is no substantially easier to engage.  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  Getting some practice evaluating hot thoughts as well.  Dialectic thinking is on the table, too, with [place]'s DBT emphasis on being the one experiencing negative feelings rather than being those negative feelings.  Been breaking the habit of taking food to his room, and cooking more for himself as well.  Affirmed and encouraged in all.  Ran across something in ADD self-help literature about "intrusive sleep", nodding off whenever he is understimulated.  Says that has always happened back to childhood.  Knows it's not just being tired, can come on regardless of how rested he is.  Has been recommended for a sleep study.  Knows he still values his quiet time at night, stays up, then gets worried about what will happen and getting enough sleep.   Has been taking melatonin most nights, sometimes finds himself fighting sleep but is trying to work with it as another "opposite action" (allowing) project.  Discussed self-reminder to let the hormone work and fine points of timing it.  Academic plan now is to do a semester at [employer] as a visiting student, evaluate, and probably finish out at [employer].  Further conversation with [employer] academic counselor has clarified how it can work.  Calculus 2 would be mandatory for the major at [employer], not at [employer], which is now swinging his preference back.  Has not yet signed up for ADHD accommodations with the school but knows he can, and he would be interested in extra time, sequestered testing, low-distraction environment, permission to record, permission to move about and rouse self in class.  Feels better able to ask for accommodations now that he's had the better experience of seeking help.  Encouraged to go ahead and contact disabilities office to find out what is needed.  OK with the option of resuming at [employer] and transferring back to [employer], whatever matches his intentions after degree.  If it should turn out [employer] and [employer], that can also be a healthy challenge, addressable by tutoring, TA help, and auxiliary instruction online.  Right now, focused more on getting effective enough stimulation.  60mg  Strattera right now, in addition to Zoloft.  Helps to take it at night, it turns out.  Re. night snacking, is cutting off self after one pretty reliably.  Has reinstituted some morning food, compared to the midday start he was having, and it may be  helping his circadian rhythm.  Therapeutic modalities: Cognitive Behavioral Therapy, Solution-Oriented/Positive Psychology, Environmental manager, and Motivational Interviewing  Mental Status/Observations:  Appearance:   Casual     Behavior:  Appropriate  Motor:  Normal  Speech/Language:   Clear and Coherent  Affect:  Appropriate  Mood:  anxious and significantly better  morale/ improving motivation  Thought process:  normal  Thought content:    WNL  Sensory/Perceptual disturbances:    WNL  Orientation:  Fully oriented  Attention:  Good    Concentration:  Good  Memory:  WNL  Insight:    Good  Judgment:   Good  Impulse Control:  Fair, improving well   Risk Assessment: Danger to Self: No Self-injurious Behavior: No Danger to Others: No Physical Aggression / Violence: No Duty to Warn: No Access to Firearms a concern: No  Assessment of progress:  progressing well  Diagnosis:   ICD-10-CM   1. Major depressive disorder, recurrent episode, moderate (HCC)  F33.1     2. Generalized anxiety disorder  F41.1     3. Attention deficit hyperactivity disorder (ADHD), predominantly inattentive type  F90.0     4. Social anxiety disorder  F40.10     5. Polysubstance abuse (HCC)  F19.10      Plan:  Continue all of the above as learned at [facility] Continue rectifying health habits as noted and tending circadian rhythm Endorse current med strategy Endorse current academic plans Other recommendations/advice as may be noted above Continue to utilize previously learned skills ad lib Maintain medication as prescribed and work faithfully with relevant prescriber(s) if any changes are desired or seem indicated Call the clinic on-call service, 988/hotline, 911, or present to [facility] or ER if any life-threatening psychiatric crisis Return for needs reschedule with prescriber, session(s) already scheduled. Already scheduled visit in this office 12/04/2020.  Robley Fries, PhD [name], PhD LP Clinical Psychologist, [facility] [address] [place], [place] [address] [phone]

## 2020-11-27 ENCOUNTER — Telehealth: Payer: Self-pay | Admitting: Adult Health

## 2020-11-27 ENCOUNTER — Ambulatory Visit: Payer: 59 | Admitting: Psychiatry

## 2020-11-27 DIAGNOSIS — F900 Attention-deficit hyperactivity disorder, predominantly inattentive type: Secondary | ICD-10-CM

## 2020-11-27 MED ORDER — ATOMOXETINE HCL 40 MG PO CAPS
40.0000 mg | ORAL_CAPSULE | Freq: Every day | ORAL | 0 refills | Status: DC
Start: 2020-11-27 — End: 2021-01-09

## 2020-11-27 MED ORDER — ESCITALOPRAM OXALATE 20 MG PO TABS: 20.0000 mg | ORAL_TABLET | Freq: Every day | ORAL | 0 refills | Status: DC

## 2020-11-27 NOTE — Telephone Encounter (Signed)
Next visit is 12/04/20. Requesting refill on Strattera and Escitalopram called to:  [facility] [id number] - [place], [place] - [address]  Phone:  [phone]  Fax:  [phone]

## 2020-12-04 ENCOUNTER — Encounter: Payer: Self-pay | Admitting: Adult Health

## 2020-12-04 ENCOUNTER — Telehealth (INDEPENDENT_AMBULATORY_CARE_PROVIDER_SITE_OTHER): Payer: 59 | Admitting: Adult Health

## 2020-12-04 ENCOUNTER — Ambulatory Visit (INDEPENDENT_AMBULATORY_CARE_PROVIDER_SITE_OTHER): Payer: 59 | Admitting: Psychiatry

## 2020-12-04 DIAGNOSIS — F900 Attention-deficit hyperactivity disorder, predominantly inattentive type: Secondary | ICD-10-CM

## 2020-12-04 DIAGNOSIS — F4010 Social phobia, unspecified: Secondary | ICD-10-CM | POA: Diagnosis not present

## 2020-12-04 DIAGNOSIS — F331 Major depressive disorder, recurrent, moderate: Secondary | ICD-10-CM

## 2020-12-04 DIAGNOSIS — F411 Generalized anxiety disorder: Secondary | ICD-10-CM

## 2020-12-04 DIAGNOSIS — F1910 Other psychoactive substance abuse, uncomplicated: Secondary | ICD-10-CM

## 2020-12-04 NOTE — Progress Notes (Signed)
Psychotherapy Progress Note [facility] [name], PhD LP  Patient ID: Louis [name] [facility])    MRN: [id number] Therapy format: Individual psychotherapy Date: 12/04/2020      Start: 10:05a     Stop: 10:47a     Time Spent: 42 min Location: Telehealth visit -- I connected with this patient by an approved telecommunication method (video), with his informed consent, and verifying identity and patient privacy.  I was located at my office and patient at his home.  As needed, we discussed the limitations, risks, and security and privacy concerns associated with telehealth service, including the availability and conditions which currently govern in-person appointments and the possibility that 3rd-party payment may not be fully guaranteed and he may be responsible for charges.  After he indicated understanding, we proceeded with the session.  Also discussed treatment planning, as needed, including ongoing verbal agreement with the plan, the opportunity to ask and answer all questions, his demonstrated understanding of instructions, and his readiness to call the office should symptoms worsen or he feels he is in a crisis state and needs more immediate and tangible assistance.   Session narrative (presenting needs, interim history, self-report of stressors and symptoms, applications of prior therapy, status changes, and interventions made in session) Changed to telehealth this morning due to virus.  Brother went through a 2-day illness himself.  2nd day back from [facility] trip.    Turns out today is the last day to submit clearance form to [facility] system, shared with [place] by email during session, needs it turned in today.  Encouraged to get ahead of these things in the future.  Meanwhile, good practice handling surprise commotion and bureaucracy this morning, between 5 phone calls to [facility] to figure out the clearance issue, difficulty getting effective instructions to start MyChart, and simultaneously  trying to corral his cat to prevent bothering his father on a work meeting.  Overall had a good time in [place], found a rare retro game controller he's been interested, took in part of the [employer].  Hardest part was maybe getting up 4am one day, or seeing a disturbing play on [employer] ([place], which featured a male lead who very much resembled his ex).  Contextualized that he misses having a relationship, not as much her.  Crowds could trigger anxiety, and the theater environment could push sleep issues, but made use of bathroom trips to decompress, made quick work of feeling self-conscious getting up during performance, and could share with family what was going on without triggering further anxiety or shame.  Only use of sedative was one Klonopin (name?) after "[place]".  Another play very edifying.  Rates success in allowing himself to take timeouts rather than agonize for a half hour.  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  Clear benefit of IOP to be free to "FYI" family when he is experiencing stress rather than cover it like he was.  Family doing OK at hearing it the way he means it when he does.  Been making good use of "can vs. should" thinking to reduce self-harassment and just engage efforts.  Currently trying to make better use of notes, partly for brain dump, partly for distress management about remembering enough, part working to break up times when it feels like he has too many too-important things to do at once and would otherwise freeze.  Trying to apply one-thing-at-a-time approach and engaging an action rather than agonizing.  Also eating  more within designated hours, not snacking late.     Current goals to get re-employed and sign up for spring classes at [employer].    Therapeutic modalities: Cognitive Behavioral Therapy and Solution-Oriented/Positive Psychology  Mental Status/Observations:  Appearance:   Casual     Behavior:  Appropriate  Motor:  Normal   Speech/Language:   Clear and Coherent  Affect:  Appropriate  Mood:  anxious and less  Thought process:  normal  Thought content:    WNL  Sensory/Perceptual disturbances:    WNL  Orientation:  Fully oriented  Attention:  Good    Concentration:  Fair  Memory:  WNL  Insight:    Good  Judgment:   Good  Impulse Control:  Good   Risk Assessment: Danger to Self: No Self-injurious Behavior: No Danger to Others: No Physical Aggression / Violence: No Duty to Warn: No Access to Firearms a concern: No  Assessment of progress:  progressing  Diagnosis:   ICD-10-CM   1. Major depressive disorder, recurrent episode, moderate (HCC)  F33.1     2. Generalized anxiety disorder with panic attacks  F41.1     3. Attention deficit hyperactivity disorder (ADHD), predominantly inattentive type  F90.0     4. Social anxiety disorder  F40.10      Plan:  [place] to fill out clearance form today [place] to investigate today what it takes to sign up for spring classes locally Brainstorm job possibilities Continue to apply DBT skills from PHS/IOP and humane engagement philosophy to tasks Other recommendations/advice as may be noted above Continue to utilize previously learned skills ad lib Maintain medication as prescribed and work faithfully with relevant prescriber(s) if any changes are desired or seem indicated Call the clinic on-call service, 988/hotline, 911, or present to [facility] or ER if any life-threatening psychiatric crisis Return for session(s) already scheduled. Already scheduled visit in this office 12/04/2020.  Robley Fries, PhD [name], PhD LP Clinical Psychologist, [facility] [address] [place], [place] [address] [phone]

## 2020-12-04 NOTE — Progress Notes (Signed)
Louis [name] [id number] 12-05-1995 25 y.o.  Virtual Visit via Video Note  I connected with pt @ on 12/04/20 at  1:00 PM EST by a video enabled telemedicine application and verified that I am speaking with the correct person using two identifiers.   I discussed the limitations of evaluation and management by telemedicine and the availability of in person appointments. The patient expressed understanding and agreed to proceed.  I discussed the assessment and treatment plan with the patient. The patient was provided an opportunity to ask questions and all were answered. The patient agreed with the plan and demonstrated an understanding of the instructions.   The patient was advised to call back or seek an in-person evaluation if the symptoms worsen or if the condition fails to improve as anticipated.  I provided 25 minutes of non-face-to-face time during this encounter.  The patient was located at home.  The provider was located at [facility].   Dorothyann Gibbs, NP   Subjective:   Patient ID:  Louis [name] is a 25 y.o. (DOB [birth year]) male.  Chief Complaint: No chief complaint on file.   HPI Gian Ybarra presents for follow-up of MDD, ADHD, GAD, polysubstance abuse.  Describes mood today as "ok". Pleasant. Flat. Mood symptoms - reports some depression - "a lot better". Decreased anxiety and irritability - "better than it was". Denies recent panic attacks. Stating "I think I'm doing well". Recently completed an IOP and partial hospitalization at [facility]. Currently taking Lexapro at 20mg  and Stratera 60mg . Does not feel like either medication has been helpful. Has been researching "intrusive sleep" associated with ADHD patients and feels he may have it. Does not feel like the stimulants were helpful previously. Varying interest and motivation. Taking medications as prescribed.  Energy levels lower. Active, has a regular exercise routine.   Enjoys some usual  interests and activities. Single. Not dating. Lives with parents. Has a cat. Spending time with family and friends. Appetite adequate. Weight stable. Sleeps better some nights than others. Averages 5 hours.  Focus and concentration difficulties. Completing tasks. Managing aspects of household. Planning to return to school - [employer] in January. Denies SI or HI.  Denies AH or VH. Substance use - nicotine vaping. History of THC. History of ETOH use - elevated LFT's. Following up with Dr. at [facility].  Previous medication trials: Prozac, Wellbutrin, Vyvanse, Adderall    Review of Systems:  Review of Systems  Musculoskeletal:  Negative for gait problem.  Neurological:  Negative for tremors.  Psychiatric/Behavioral:         Please refer to HPI   Medications: I have reviewed the patient's current medications.  Current Outpatient Medications  Medication Sig Dispense Refill   atomoxetine (STRATTERA) 40 MG capsule Take 1 capsule (40 mg total) by mouth daily. 30 capsule 0   escitalopram (LEXAPRO) 20 MG tablet Take 1 tablet (20 mg total) by mouth daily. 30 tablet 0   hydrOXYzine (VISTARIL) 25 MG capsule Take 1 capsule (25 mg total) by mouth 3 (three) times daily as needed. 90 capsule 0   No current facility-administered medications for this visit.    Medication Side Effects: None  Allergies: No Known Allergies  Past Medical History:  Diagnosis Date   ADHD     No family history on file.  Social History   Socioeconomic History   Marital status: Single    Spouse name: Not on file   Number of children: Not on file   Years of education: Not  on file   Highest education level: Not on file  Occupational History   Not on file  Tobacco Use   Smoking status: Never   Smokeless tobacco: Never  Substance and Sexual Activity   Alcohol use: Not on file   Drug use: Not on file   Sexual activity: Not on file  Other Topics Concern   Not on file  Social History Narrative   Not on  file   Social Determinants of Health   Financial Resource Strain: Not on file  Food Insecurity: Not on file  Transportation Needs: Not on file  Physical Activity: Not on file  Stress: Not on file  Social Connections: Not on file  Intimate Partner Violence: Not on file    Past Medical History, Surgical history, Social history, and Family history were reviewed and updated as appropriate.   Please see review of systems for further details on the patient's review from today.   Objective:   Physical Exam:  There were no vitals taken for this visit.  Physical Exam Constitutional:      General: He is not in acute distress. Musculoskeletal:        General: No deformity.  Neurological:     Mental Status: He is alert and oriented to person, place, and time.     Coordination: Coordination normal.  Psychiatric:        Attention and Perception: Attention and perception normal. He does not perceive auditory or visual hallucinations.        Mood and Affect: Mood normal. Mood is not anxious or depressed. Affect is not labile, blunt, angry or inappropriate.        Speech: Speech normal.        Behavior: Behavior normal.        Thought Content: Thought content normal. Thought content is not paranoid or delusional. Thought content does not include homicidal or suicidal ideation. Thought content does not include homicidal or suicidal plan.        Cognition and Memory: Cognition and memory normal.        Judgment: Judgment normal.     Comments: Insight intact    Lab Review:  No results found for: NA, K, CL, CO2, GLUCOSE, BUN, CREATININE, CALCIUM, PROT, ALBUMIN, AST, ALT, ALKPHOS, BILITOT, GFRNONAA, GFRAA  No results found for: WBC, RBC, HGB, HCT, PLT, MCV, MCH, MCHC, RDW, LYMPHSABS, MONOABS, EOSABS, BASOSABS  No results found for: POCLITH, LITHIUM   No results found for: PHENYTOIN, PHENOBARB, VALPROATE, CBMZ   .res Assessment: Plan:    Plan:  PDMP reviewed  Continue Lexapro 20mg   daily  Continue Stratera 60mg  daily x 1 month Add Clonazepam 0.5mg  BID prn anxiety - previously given by PCP - none needed currently  Working with Dr testing results given for review.  Has ceased substance use.  RTC 4 weeks  Patient advised to contact office with any questions, adverse effects, or acute worsening in signs and symptoms. Diagnoses and all orders for this visit:  Major depressive disorder, recurrent episode, moderate (HCC)  Attention deficit hyperactivity disorder (ADHD), predominantly inattentive type  Generalized anxiety disorder  Polysubstance abuse (HCC)    Please see After Visit Summary for patient specific instructions.  Future Appointments  Date Time Provider Department Center  12/08/2020  2:00 PM Gabrielle Dare, PhD CP-CP None  12/11/2020  2:00 PM Robley Fries, PhD CP-CP None  12/14/2020  2:00 PM Robley Fries, PhD CP-CP None  12/18/2020  2:00 PM Robley Fries, PhD CP-CP  None  01/12/2021  4:00 PM Robley Fries, PhD CP-CP None  01/19/2021  4:00 PM Mitchum, Molly Maduro, PhD CP-CP None    No orders of the defined types were placed in this encounter.     -------------------------------

## 2020-12-08 ENCOUNTER — Ambulatory Visit: Payer: 59 | Admitting: Psychiatry

## 2020-12-08 DIAGNOSIS — F900 Attention-deficit hyperactivity disorder, predominantly inattentive type: Secondary | ICD-10-CM

## 2020-12-08 DIAGNOSIS — Z0289 Encounter for other administrative examinations: Secondary | ICD-10-CM

## 2020-12-08 DIAGNOSIS — F331 Major depressive disorder, recurrent, moderate: Secondary | ICD-10-CM

## 2020-12-08 NOTE — Progress Notes (Signed)
Psychotherapy Progress Note [facility] [name], PhD LP  Patient ID: Louis [name] [facility])    MRN: [id number] Therapy format: Individual psychotherapy Date: 12/08/2020      Start: 2:35p     Stop: 2:40p     Time Spent: 5 min -- no charge Location: Telehealth visit -- I connected with this patient by an approved telecommunication method (video), with his informed consent, and verifying identity and patient privacy.  I was located at my office and patient at his home.  As needed, we discussed the limitations, risks, and security and privacy concerns associated with telehealth service, including the availability and conditions which currently govern in-person appointments and the possibility that 3rd-party payment may not be fully guaranteed and he may be responsible for charges.  After he indicated understanding, we proceeded with the session.  Also discussed treatment planning, as needed, including ongoing verbal agreement with the plan, the opportunity to ask and answer all questions, his demonstrated understanding of instructions, and his readiness to call the office should symptoms worsen or he feels he is in a crisis state and needs more immediate and tangible assistance.   Session narrative (presenting needs, interim history, self-report of stressors and symptoms, applications of prior therapy, status changes, and interventions made in session) Late arriving to video today, made TC to confirm, reached PT, who agreed to abbreviated session.  On connecting, however, revealed the viral illness that got in the house is in fact COVID and all of them have been extra fatigued.  Agreed to cancel without penalty.  Already scheduled next week, Monday 2pm, will convert to video d/t remaining quarantine need.  Louis Fries, PhD [name], PhD LP Clinical Psychologist, [facility] [address], [address] [place], [place]  [address] [phone]

## 2020-12-11 ENCOUNTER — Ambulatory Visit (INDEPENDENT_AMBULATORY_CARE_PROVIDER_SITE_OTHER): Payer: 59 | Admitting: Psychiatry

## 2020-12-11 DIAGNOSIS — R69 Illness, unspecified: Secondary | ICD-10-CM | POA: Diagnosis not present

## 2020-12-11 DIAGNOSIS — F331 Major depressive disorder, recurrent, moderate: Secondary | ICD-10-CM | POA: Diagnosis not present

## 2020-12-11 DIAGNOSIS — F411 Generalized anxiety disorder: Secondary | ICD-10-CM

## 2020-12-11 DIAGNOSIS — F900 Attention-deficit hyperactivity disorder, predominantly inattentive type: Secondary | ICD-10-CM | POA: Diagnosis not present

## 2020-12-11 NOTE — Progress Notes (Signed)
Psychotherapy Progress Note [facility] [name], PhD LP  Patient ID: Louis [name] [facility])    MRN: [id number] Therapy format: Individual psychotherapy Date: 12/11/2020      Start: 2:13p     Stop: 2:53p     Time Spent: 40 min Location: Telehealth visit -- I connected with this patient by an approved telecommunication method (video), with his informed consent, and verifying identity and patient privacy.  I was located at my office and patient at his home.  As needed, we discussed the limitations, risks, and security and privacy concerns associated with telehealth service, including the availability and conditions which currently govern in-person appointments and the possibility that 3rd-party payment may not be fully guaranteed and he may be responsible for charges.  After he indicated understanding, we proceeded with the session.  Also discussed treatment planning, as needed, including ongoing verbal agreement with the plan, the opportunity to ask and answer all questions, his demonstrated understanding of instructions, and his readiness to call the office should symptoms worsen or he feels he is in a crisis state and needs more immediate and tangible assistance.   Session narrative (presenting needs, interim history, self-report of stressors and symptoms, applications of prior therapy, status changes, and interventions made in session) Feeling decent amount better than Friday.  Antiviral (Paxlovid?) upset his stomach.  Figures quarantine can end today.  Does notice late viral syndrome is tempting him to relapse in procrastination and sleep.  Appt tomorrow with a [place] advisor about returning from leave.  Frustrating communication with [facility] [employer] [facility] [employer].  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    Meanwhile, been helping parents decorate for Christmas.  Encouraged to get some time outdoors for stimulus, head-clearing, and sense of freedom.   Right now, c/o feeling overwhelmed.  As opposite action goes, he last night got himself to brush his teeth at a time when he felt like begging off.  Still notes much reduced night snacking.  Has been having some difficulty staying regular with his ADHD medication, partly during [place] trip.  Has had some times when can get somnolent behind the wheel.    Discussed tactics for waking himself without appeal to medication (cleansing breaths, breath-holding while stretching), though he still would like to see about something more clearly stimulating and feels it would not lead him to use more pot.  Advised that dopaminergic meds (Wellbutrin, Abilify, Rexulti) may be an option, but it will be a matter of consulting with his psychiatrist.  Therapeutic modalities: Cognitive Behavioral Therapy and Solution-Oriented/Positive Psychology  Mental Status/Observations:  Appearance:   Casual     Behavior:  Appropriate  Motor:  Normal  Speech/Language:   Clear and Coherent  Affect:  Appropriate  Mood:  dysthymic  Thought process:  normal  Thought content:    WNL  Sensory/Perceptual disturbances:    WNL  Orientation:  Fully oriented  Attention:  Good    Concentration:  Good  Memory:  WNL  Insight:    Good  Judgment:   Good  Impulse Control:  Fair   Risk Assessment: Danger to Self: No Self-injurious Behavior: No Danger to Others: No Physical Aggression / Violence: No Duty to Warn: No Access to Firearms a concern: No  Assessment of progress:  stabilized  Diagnosis:   ICD-10-CM   1. Attention deficit hyperactivity disorder (ADHD), predominantly inattentive type  F90.0     2. Major depressive disorder, recurrent episode, moderate (HCC)  F33.1  3. Generalized anxiety disorder  F41.1     4. r/o hypersomnolence disorder  R69      Plan:  Goal of getting outside each day from now to Thursday, preferably in the morning Music time -- e.g., picking up his neglected trumpet Follow through on  tomorrow's return-advisor meeting Ensure fully informed on deadlines for both schools Daily idea -- identify something done for body, school/work situation, home, fun Other recommendations/advice as may be noted above Continue to utilize previously learned skills ad lib Maintain medication as prescribed and work faithfully with relevant prescriber(s) if any changes are desired or seem indicated Call the clinic on-call service, 988/hotline, 911, or present to [facility] or ER if any life-threatening psychiatric crisis Return for session(s) already scheduled. Already scheduled visit in this office 12/14/2020.  Robley Fries, PhD [name], PhD LP Clinical Psychologist, [facility] [address] [place], [place] [address] [phone]

## 2020-12-12 ENCOUNTER — Telehealth: Payer: Self-pay | Admitting: Adult Health

## 2020-12-12 NOTE — Telephone Encounter (Signed)
Great thank you!

## 2020-12-12 NOTE — Telephone Encounter (Signed)
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

## 2020-12-14 ENCOUNTER — Ambulatory Visit (INDEPENDENT_AMBULATORY_CARE_PROVIDER_SITE_OTHER): Payer: 59 | Admitting: Psychiatry

## 2020-12-14 ENCOUNTER — Other Ambulatory Visit: Payer: Self-pay

## 2020-12-14 DIAGNOSIS — F900 Attention-deficit hyperactivity disorder, predominantly inattentive type: Secondary | ICD-10-CM

## 2020-12-14 DIAGNOSIS — F411 Generalized anxiety disorder: Secondary | ICD-10-CM

## 2020-12-14 DIAGNOSIS — F331 Major depressive disorder, recurrent, moderate: Secondary | ICD-10-CM

## 2020-12-14 DIAGNOSIS — R69 Illness, unspecified: Secondary | ICD-10-CM | POA: Diagnosis not present

## 2020-12-14 NOTE — Progress Notes (Signed)
Psychotherapy Progress Note [facility] [name], PhD LP  Patient ID: Louis [name] [facility])    MRN: [id number] Therapy format: Individual psychotherapy Date: 12/14/2020      Start: 2:20p     Stop: 3:03p     Time Spent: 43 min Location: In-person   Session narrative (presenting needs, interim history, self-report of stressors and symptoms, applications of prior therapy, status changes, and interventions made in session) Back from COVID quarantine.  Struggling again with sleep, and with daytime anxiety, some moments of feeling paralyzed about making choices and engaging tasks.  Got caught short forgetting about an obligation.  Some tension with mother about getting out to help with yard work he agreed to.  Cat Cozart started peeing on his things when he leaves them on the floor, which sparked him to clean up more.  Still speed-playing [name], which does take up enough time and attention sometimes to neglect other things, and he has caught himself again feeling like he has to perform near-perfectly to be OK, though he has tamed self-reactions when he does not perform that well.  Irritated at a few comments from mother that seem to unnecessarily frame issues as character rather than the needs of the moment (e.g., telling him he is going to have to do some thing in life he doesn't want to do when the issue at hand was that he was hesitant, anxious engaging an outdoor task with her; ultimately did help .    Re. 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  Now in "wait" mode, knows he will tend to get restless if he doesn't find something to do.  Discussed options.  Therapeutic modalities: Cognitive Behavioral Therapy, Solution-Oriented/Positive Psychology, Assertiveness/Communication, and Motivational Interviewing  Mental Status/Observations:  Appearance:   Casual     Behavior:  Appropriate  Motor:  Normal   Speech/Language:   Clear and Coherent  Affect:  Appropriate  Mood:  anxious and modulated  Thought process:  normal  Thought content:    WNL  Sensory/Perceptual disturbances:    WNL  Orientation:  Fully oriented  Attention:  Good    Concentration:  Good  Memory:  WNL  Insight:    Good  Judgment:   Good  Impulse Control:  Fair   Risk Assessment: Danger to Self: No Self-injurious Behavior: No Danger to Others: No Physical Aggression / Violence: No Duty to Warn: No Access to Firearms a concern: No  Assessment of progress:  progressing  Diagnosis:   ICD-10-CM   1. Major depressive disorder, recurrent episode, moderate (HCC)  F33.1     2. Generalized anxiety disorder  F41.1     3. Attention deficit hyperactivity disorder (ADHD), predominantly inattentive type  F90.0     4. r/o hypersomnolence disorder  R69      Plan:  Continue practicing initiative and opposite action tactics from IOP Get outside daily (body, future, home, fun) Continue 4-area checklist or each day Ensure up to speed on registration process for spring semester Other recommendations/advice as may be noted above Continue to utilize previously learned skills ad lib Maintain medication as prescribed and work faithfully with relevant prescriber(s) if any changes are desired or seem indicated Call the clinic on-call service, 988/hotline, 911, or present to [facility] or ER if any life-threatening psychiatric crisis Return for session(s) already scheduled. Already scheduled visit in this office 12/18/2020.  Robley Fries, PhD [name], PhD LP Clinical Psychologist, [facility] [address], [place], [place] [address] [phone]

## 2020-12-18 ENCOUNTER — Other Ambulatory Visit: Payer: Self-pay

## 2020-12-18 ENCOUNTER — Ambulatory Visit (INDEPENDENT_AMBULATORY_CARE_PROVIDER_SITE_OTHER): Payer: 59 | Admitting: Psychiatry

## 2020-12-18 DIAGNOSIS — F331 Major depressive disorder, recurrent, moderate: Secondary | ICD-10-CM

## 2020-12-18 DIAGNOSIS — F411 Generalized anxiety disorder: Secondary | ICD-10-CM | POA: Diagnosis not present

## 2020-12-18 DIAGNOSIS — F900 Attention-deficit hyperactivity disorder, predominantly inattentive type: Secondary | ICD-10-CM

## 2020-12-18 DIAGNOSIS — R748 Abnormal levels of other serum enzymes: Secondary | ICD-10-CM

## 2020-12-18 DIAGNOSIS — R69 Illness, unspecified: Secondary | ICD-10-CM

## 2020-12-18 NOTE — Progress Notes (Signed)
Psychotherapy Progress Note [facility] [name], PhD LP  Patient ID: Louis [name] [facility])    MRN: [id number] Therapy format: Individual psychotherapy Date: 12/18/2020      Start: 2:15p     Stop: 3:00p     Time Spent: 45 min Location: In-person   Session narrative (presenting needs, interim history, self-report of stressors and symptoms, applications of prior therapy, status changes, and interventions made in session) "Not bad", still trying to get sleep working better.  Has been trying the 4-point daily idea.  Helped mom put up Xmas lights the other day.  Still waiting to hear from [facility], employers, which is tense.  Went with mom this morning to help pack charitable gifts, which felt good.  Looking into a group therapy (support group) with people his age.  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    Explored ways to stimulate alertness.  Does find that a change of scenery helps temporarily.  Cleansing breaths can help some, has tried them while driving.  If somnolent driving, will reliably be able to pull over and walk around, but has napped before.  Breath-holding and stretching can help wake some.  Oriented to other possibilities -- purposeful hyperventilating, hand/arm scratching (non damaging, just nails), water or cold stimulus.  Says he gets hydrated enough.  Has had a liver enzyme up, not sure what it might be.  Discussed possibility of ammonia buildup as one little-known systemic cause.  Tried hyperventilation in session to good benefit -- was able to wake some when led to do so, suggesting possibility of CO2 retention.  Meanwhile, c/o mother repeatedly throwing in phrases like "You need to grow up and ..." [go to bed, e.g.]  Was up 2am recently, unable yet to key down, when said that, he told he it wasn't helpful, she asked if he wants her to not say anything at all.  Recommended try to usher the  conversation more constructive -- can let her know he was trying to deal with anxious insomnia by watching something but can also ask a sidebar with her to let her know it's unnecessarily shaming and what would work better, e.g., asking what's going on before up and telling him what he should do, and just leave off the "grow up" part because this isn't 6th/7th grade when he stayed up gaming and "I was immature then."  Therapeutic modalities: Cognitive Behavioral Therapy, Solution-Oriented/Positive Psychology, Ego-Supportive, and Assertiveness/Communication  Mental Status/Observations:  Appearance:   Casual     Behavior:  Appropriate  Motor:  Normal  Speech/Language:   Clear and Coherent  Affect:  Appropriate, Constricted, and less  Mood:  anxious and dysthymic  Thought process:  normal  Thought content:    WNL  Sensory/Perceptual disturbances:    WNL  Orientation:  Fully oriented  Attention:  Good    Concentration:  Fair  Memory:  WNL  Insight:    Fair  Judgment:   Good  Impulse Control:  Fair   Risk Assessment: Danger to Self: No Self-injurious Behavior: No Danger to Others: No Physical Aggression / Violence: No Duty to Warn: No Access to Firearms a concern: No  Assessment of progress:  progressing  Diagnosis:   ICD-10-CM   1. Generalized anxiety disorder  F41.1     2. Attention deficit hyperactivity disorder (ADHD), predominantly inattentive type  F90.0     3. Major depressive disorder, recurrent episode, moderate (HCC)  F33.1  4. Liver enzyme elevation (by self-report, possibly a kidney issue)  R74.8     5. Suspected episodic CO2 buildup, possible ammonia  R69      Plan:  May follow through with neuro referral, but worth checking some low-hanging systemic and behavioral fruit while waiting for it: Possible need to check ammonia level -- can solicit blood test from regular physician, psychiatrist, or possibly walk-in testing Good chance of CO2 retention from  incomplete breathing habit, can be remedied situationally with deeper breathing now and then, intentional hyperventilation as needed Try followup conversation with mother clarifying his up-late status.  Reiterate as needed the agreement that he is practicing to be an adult and will be in charge of his own efforts; she can legitimately let him know what she sees and remind him of healthy steps he can take, but best refrain from invalidating criticisms and characterizations.  If she does, Rayden recommended to take with a grain of salt as mother illustrating her frustration, not actually denigrating him. 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 [employer] as planned Continue movement, outdoor exposure, Radiation protection practitioner, distance vision as mood and alertness helpers Continue sincere effort to clean his own room, for goodwill with mother  Other recommendations/advice as may be noted above Continue to utilize previously learned skills ad lib Maintain medication as prescribed and work faithfully with relevant prescriber(s) if any changes are desired or seem indicated Call the clinic on-call service, 988/hotline, 911, or present to [facility] or ER if any life-threatening psychiatric crisis Return for session(s) already scheduled. Already scheduled visit in this office 12/27/2020.  Robley Fries, PhD [name], PhD LP Clinical Psychologist, [facility] [address] [place], [place] [address] [phone]

## 2020-12-19 ENCOUNTER — Telehealth: Payer: Self-pay

## 2020-12-19 NOTE — Telephone Encounter (Signed)
Pt was in the office to see Dr. Farrel Demark yesterday and was asking about the referral that was made. I called him this morning and explained we went ahead and referred him over to get the ball rolling, obviously during the holidays and just getting an apt in general will be awhile. Him and Rene Kocher spoke about his sleep issue at the last apt.  Informed him we referred him to [facility] and if he doesn't hear anything in a couple weeks to call me back to follow up. He agreed.

## 2020-12-27 ENCOUNTER — Other Ambulatory Visit: Payer: Self-pay

## 2020-12-27 ENCOUNTER — Ambulatory Visit (INDEPENDENT_AMBULATORY_CARE_PROVIDER_SITE_OTHER): Payer: 59 | Admitting: Psychiatry

## 2020-12-27 DIAGNOSIS — F900 Attention-deficit hyperactivity disorder, predominantly inattentive type: Secondary | ICD-10-CM | POA: Diagnosis not present

## 2020-12-27 DIAGNOSIS — F331 Major depressive disorder, recurrent, moderate: Secondary | ICD-10-CM | POA: Diagnosis not present

## 2020-12-27 DIAGNOSIS — R69 Illness, unspecified: Secondary | ICD-10-CM | POA: Diagnosis not present

## 2020-12-27 DIAGNOSIS — G4719 Other hypersomnia: Secondary | ICD-10-CM

## 2020-12-27 NOTE — Progress Notes (Signed)
Psychotherapy Progress Note [facility] [name], PhD LP  Patient ID: Louis [name] [facility])    MRN: [id number] Therapy format: Individual psychotherapy Date: 12/27/2020      Start: 8:14a     Stop: 9:00a     Time Spent: 46 min Location: In-person   Session narrative (presenting needs, interim history, self-report of stressors and symptoms, applications of prior therapy, status changes, and interventions made in session) "Not so bad."  Has submitted everything, now waiting for [employer] response.  Brother's birthday yesterday, Christmas break on now.  Sometimes parents will have things they want help with, not feeling or showing any resistance to it, though may be a moment like father telling him he doesn't need his help any more, will ask him again for something later.  Sounds like some interactions feel fraught with unspoken expectations, tend to maintain worrisome thinking, tension over whether he's failing expectations.  Continue to advocate for frankness and OK to ask questions.  Did have conversation with mother to try to get better understanding about his insomnia the other night.  Further incidents with mother asking, with implications, what he thinks he can or should do to "be more productive".  While it does sound like it could be annoying, he has identified it as her tendency to all-or-none thinking, and she has acknowledged it's the way she is/was raised, Louis [name] is keeping it clear that getting her to approach it differently is not the only fix, and he is working to reckon with it differently, e.g., asking her the other day if she feels the question has more to do with her stated upbringing or a tangible need right now to get certain thing done, since he had just minutes earlier come in from father dismissing him from a car repair.  Starting to ask how important/urgent a task is when not apparent, or put in vague, foreboding terms.    Affirmed the value of inquiring about  these things, provided it can be as clear as possible that he's fully willing if he can see why.  By the same token, worth doing som things just to demonstrate good will and bring down the impression that he has to be pestered to be "productive".  Recommended maintain the orientation, explain if needed, that he is willing, just trying to learn why that he does not himself see.  Discussed possibility of spontaneous help around the house, says he often has no idea what would be helpful, recommended just offering as actual help, and actually breaking up the image that they always have to come to him.    Discussed possibility of using unstructured time to "game out" the [place] campus.  Acknowledged he can see good reasons for going out of the usual but virtually never engages because he can't feel the point.  Reframed as gift to his future self, the one that might feel under the gun when the time comes, and offered special considerations to bring a sense of play into what tends to feel like more "work" and expectations, e.g., indulge mock resistance (show up, park, cross arms and pout, make sure you don't walk onto campus), and irreverent internal game experience (pretend to be a terrorist scoping out the best place to plant a bomb).  Acknowledged that he has played some video games that he found had uncanny ways of applying to real life and driving planning, means-ends thinking, and attention to how he can invest in future.  Noticed a divot in his  thumbnail, attrib to his injury.  Likely damage to the nail bed that will iron out over period of months.  Neurologist appt in March, accepted for sleep study.  Affirmed and encouraged in discussing his symptoms with Dr. Vickey Huger, prepared for likely experience of sleep study, screened for sxs of absence seizure (negative).  Had an episode of "ADHD sleep" going to see Avatar II, tried to stay awake but slept something like 2/3 of the movie.  Agreed it may not be the most  stimulating thing to see (3.5 hrs, he says), it's dark and comfortable, and any sleep drive will tend to take over under the conditions.  Confirmed he has not had any episodes where he is actively driving and falls asleep behind the wheel, is always able to pull over safely before yielding to it.  Validated that he could well be onto something with his "ADHD sleep" theory, which a quick scan of Internet offerings suggests may be idiopathic or transient hypersomnia.  Therapeutic modalities: Cognitive Behavioral Therapy, Solution-Oriented/Positive Psychology, and Psycho-education/Bibliotherapy  Mental Status/Observations:  Appearance:   Casual     Behavior:  Appropriate  Motor:  Normal  Speech/Language:   Clear and Coherent  Affect:  Appropriate  Mood:  dysthymic  Thought process:  normal  Thought content:    WNL  Sensory/Perceptual disturbances:    WNL  Orientation:  Fully oriented  Attention:  Generally good, moments of losing train    Concentration:  Good  Memory:  WNL  Insight:    Good  Judgment:   Good  Impulse Control:  Good   Risk Assessment: Danger to Self: No Self-injurious Behavior: No Danger to Others: No Physical Aggression / Violence: No Duty to Warn: No Access to Firearms a concern: No  Assessment of progress:  progressing  Diagnosis:   ICD-10-CM   1. Attention deficit hyperactivity disorder (ADHD), predominantly inattentive type  F90.0     2. Major depressive disorder, recurrent episode, moderate (HCC)  F33.1     3. Transient hypersomnia (suspected -- to be assessed by neurology)  G47.19     4. r/o episodic CO2 buildup, possible ammonia  R69      Plan:  Spontaneous offer of help, at least once, to each of his parents in the next week.  Remember it helps break up the appearance of being resistant, even if no action gets agreed to. Encourage to make a recon run of [employer], to inform his mental map, recognize parking and travel time to destinations on campus,  stimulate his neural network for the "game" of being student next term.   Communication recommendations with parents continue to present fully willing to take behavioral requests, forgive the sound of things wherever possible, and genuinely inquire about the importance/value of thing that are not apparent as long as it is received. Use breathing/wakefulness tactics from last session further to see if CO2 retention may be at issue, or at least if respiratory tactics can address the actual process.Other recommendations/advice as may be noted above Continue to utilize previously learned skills ad lib Maintain medication as prescribed and work faithfully with relevant prescriber(s) if any changes are desired or seem indicated Call the clinic on-call service, 988/hotline, 911, or present to [facility] or ER if any life-threatening psychiatric crisis Return for session(s) already scheduled. Already scheduled visit in this office 01/12/2021.  Robley Fries, PhD [name], PhD LP Clinical Psychologist, [facility] [address], [address] [place],  [place] [address] (o) [phone]

## 2021-01-01 ENCOUNTER — Other Ambulatory Visit (HOSPITAL_COMMUNITY): Payer: Self-pay | Admitting: Psychiatry

## 2021-01-09 ENCOUNTER — Other Ambulatory Visit: Payer: Self-pay

## 2021-01-09 ENCOUNTER — Encounter: Payer: Self-pay | Admitting: Adult Health

## 2021-01-09 ENCOUNTER — Ambulatory Visit (INDEPENDENT_AMBULATORY_CARE_PROVIDER_SITE_OTHER): Payer: 59 | Admitting: Adult Health

## 2021-01-09 DIAGNOSIS — F1910 Other psychoactive substance abuse, uncomplicated: Secondary | ICD-10-CM | POA: Diagnosis not present

## 2021-01-09 DIAGNOSIS — F900 Attention-deficit hyperactivity disorder, predominantly inattentive type: Secondary | ICD-10-CM

## 2021-01-09 DIAGNOSIS — F411 Generalized anxiety disorder: Secondary | ICD-10-CM | POA: Diagnosis not present

## 2021-01-09 DIAGNOSIS — F331 Major depressive disorder, recurrent, moderate: Secondary | ICD-10-CM

## 2021-01-09 MED ORDER — METHYLPHENIDATE HCL ER (OSM) 36 MG PO TBCR: 36.0000 mg | EXTENDED_RELEASE_TABLET | Freq: Every day | ORAL | 0 refills | Status: DC

## 2021-01-09 MED ORDER — ESCITALOPRAM OXALATE 20 MG PO TABS: 20.0000 mg | ORAL_TABLET | Freq: Every day | ORAL | 5 refills | Status: DC

## 2021-01-09 MED ORDER — MIRTAZAPINE 15 MG PO TABS: 15.0000 mg | ORAL_TABLET | Freq: Every day | ORAL | 5 refills | Status: DC

## 2021-01-09 NOTE — Progress Notes (Signed)
Louis [name] [id number] 1995-02-19 26 y.o.  Subjective:   Patient ID:  Louis [name] is a 26 y.o. (DOB 03-02-1995) male.  Chief Complaint: No chief complaint on file.   HPI Louis [name] presents to the office today for follow-up of MDD, ADHD, GAD, polysubstance abuse.  Describes mood today as "ok". Pleasant. Flat. Mood symptoms - reports some depression, anxiety and irritability - "more associated with ADHD". Denies recent panic attacks. Stating "I'm doing alright". Plans to start classes on January 9th at [employer]. Would like to restart a stimulant - Concerta -  to help with ADHD symptoms. Improved interest and motivation. Taking medications as prescribed.  Energy levels lower. Active, has a regular exercise routine.   Enjoys some usual interests and activities. Single. Not dating. Lives with parents and cat. Spending time with family and friends. Appetite adequate. Weight stable - 205 pounds. Sleeps better some nights than others. Averages 6 to 6.5 hours.  Focus and concentration difficulties. Completing tasks. Managing aspects of household. Planning to return to school - [employer] in January. Denies SI or HI.  Denies AH or VH. Substance use - nicotine vaping. History of THC. History of ETOH use - elevated LFT's. Following up with Dr. Leanora Ivanoff at [facility].  [facility] IOP [facility] at [facility].  Previous medication trials: Prozac, Wellbutrin, Vyvanse, Adderall   PHQ2-9    Flowsheet Row Counselor from 05/03/2020 in [facility]  PHQ-2 Total Score 5  PHQ-9 Total Score 20        Review of Systems:  Review of Systems  Musculoskeletal:  Negative for gait problem.  Neurological:  Negative for tremors.  Psychiatric/Behavioral:         Please refer to HPI   Medications: I have reviewed the patient's current medications.  Current Outpatient Medications  Medication Sig Dispense Refill   methylphenidate (CONCERTA) 36 MG PO CR tablet Take 1 tablet (36 mg total)  by mouth daily. 30 tablet 0   mirtazapine (REMERON) 15 MG tablet Take 1 tablet (15 mg total) by mouth at bedtime. 30 tablet 5   escitalopram (LEXAPRO) 20 MG tablet Take 1 tablet (20 mg total) by mouth daily. 30 tablet 5   hydrOXYzine (VISTARIL) 25 MG capsule Take 1 capsule (25 mg total) by mouth 3 (three) times daily as needed. 90 capsule 0   No current facility-administered medications for this visit.    Medication Side Effects: None  Allergies: No Known Allergies  Past Medical History:  Diagnosis Date   ADHD     Past Medical History, Surgical history, Social history, and Family history were reviewed and updated as appropriate.   Please see review of systems for further details on the patient's review from today.   Objective:   Physical Exam:  There were no vitals taken for this visit.  Physical Exam Constitutional:      General: He is not in acute distress. Musculoskeletal:        General: No deformity.  Neurological:     Mental Status: He is alert and oriented to person, place, and time.     Coordination: Coordination normal.  Psychiatric:        Attention and Perception: Attention and perception normal. He does not perceive auditory or visual hallucinations.        Mood and Affect: Mood normal. Mood is not anxious or depressed. Affect is not labile, blunt, angry or inappropriate.        Speech: Speech normal.        Behavior: Behavior  normal.        Thought Content: Thought content normal. Thought content is not paranoid or delusional. Thought content does not include homicidal or suicidal ideation. Thought content does not include homicidal or suicidal plan.        Cognition and Memory: Cognition and memory normal.        Judgment: Judgment normal.     Comments: Insight intact    Lab Review:  No results found for: NA, K, CL, CO2, GLUCOSE, BUN, CREATININE, CALCIUM, PROT, ALBUMIN, AST, ALT, ALKPHOS, BILITOT, GFRNONAA, GFRAA  No results found for: WBC, RBC, HGB, HCT,  PLT, MCV, MCH, MCHC, RDW, LYMPHSABS, MONOABS, EOSABS, BASOSABS  No results found for: POCLITH, LITHIUM   No results found for: PHENYTOIN, PHENOBARB, VALPROATE, CBMZ   .res Assessment: Plan:   Plan:  PDMP reviewed  Continue Lexapro 20mg  daily  Add Remeron 15mg  at bedtime Add Concerta 36mg  daily  BP - 111/68 77  Working with Dr testing results given for review.  Has ceased substance use.  RTC 4 weeks  Patient advised to contact office with any questions, adverse effects, or acute worsening in signs and symptoms.  Diagnoses and all orders for this visit:  Attention deficit hyperactivity disorder (ADHD), predominantly inattentive type  Major depressive disorder, recurrent episode, moderate (HCC)  Generalized anxiety disorder  Polysubstance abuse (HCC)  Other orders -     escitalopram (LEXAPRO) 20 MG tablet; Take 1 tablet (20 mg total) by mouth daily. -     mirtazapine (REMERON) 15 MG tablet; Take 1 tablet (15 mg total) by mouth at bedtime. -     methylphenidate (CONCERTA) 36 MG PO CR tablet; Take 1 tablet (36 mg total) by mouth daily.     Please see After Visit Summary for patient specific instructions.  Future Appointments  Date Time Provider Department Center  01/12/2021  4:00 PM , PhD CP[facility]CP None  01/19/2021  4:00 PM 03/12/2021, PhD CP-CP None  02/01/2021  4:00 PM 01/21/2021, PhD CP-CP None  02/08/2021  4:00 PM 02/03/2021, PhD CP-CP None  02/15/2021  4:00 PM 04/08/2021, PhD CP-CP None  02/23/2021  3:00 PM 04/15/2021, PhD CP-CP None  03/12/2021  3:00 PM Dohmeier, 02/25/2021, MD [facility] None    No orders of the defined types were placed in this encounter.   -------------------------------

## 2021-01-12 ENCOUNTER — Ambulatory Visit (INDEPENDENT_AMBULATORY_CARE_PROVIDER_SITE_OTHER): Payer: 59 | Admitting: Psychiatry

## 2021-01-12 DIAGNOSIS — F900 Attention-deficit hyperactivity disorder, predominantly inattentive type: Secondary | ICD-10-CM | POA: Diagnosis not present

## 2021-01-12 DIAGNOSIS — F4010 Social phobia, unspecified: Secondary | ICD-10-CM | POA: Diagnosis not present

## 2021-01-12 DIAGNOSIS — G4719 Other hypersomnia: Secondary | ICD-10-CM | POA: Diagnosis not present

## 2021-01-12 NOTE — Progress Notes (Signed)
Psychotherapy Progress Note [facility] [name], PhD LP  Patient ID: Louis [name] [facility])    MRN: [id number] Therapy format: Individual psychotherapy Date: 01/12/2021      Start: 4:36p     Stop: 5:06p     Time Spent: 30 min Location: Telehealth visit -- I connected with this patient by an approved telecommunication method (audio only), with his informed consent, and verifying identity and patient privacy.  I was located at my office and patient at his home.  As needed, we discussed the limitations, risks, and security and privacy concerns associated with telehealth service, including the availability and conditions which currently govern in-person appointments and the possibility that 3rd-party payment may not be fully guaranteed and he may be responsible for charges.  After he indicated understanding, we proceeded with the session.  Also discussed treatment planning, as needed, including ongoing verbal agreement with the plan, the opportunity to ask and answer all questions, his demonstrated understanding of instructions, and his readiness to call the office should symptoms worsen or he feels he is in a crisis state and needs more immediate and tangible assistance.   Session narrative (presenting needs, interim history, self-report of stressors and symptoms, applications of prior therapy, status changes, and interventions made in session) Forgot the appt, reached late.  Did get into [employer], starting Monday, after having to contact a couple profs about his transfer credits.  Has his books, feels ready and positive about the coming return to school.  New meds -- mirtazapine and methylphenidate a few days ago.  Feels methylphenidate works for focus.  Mirtazapine QHS helpful to prompt timely sleep at night.  Not tired to the point of needing nap during the day, suggesting his intrusive sleep problem may be more about unrestful sleep at night.   Finding it stimulating to work with  a Psychologist, educational, still feels awkward working out on his own, self-conscious both at the competitive "buff" gym and the "old guys" country club gym.  Advocated permission to learn, and "limp", however it goes, just show up and try to give himself credit for getting there and relief from judgment.  Has done some more speed-gaming, got noticed this morning by a game developer as one of the elite players, which prompted him to check self-critical thinking.  Realizing today that he can catch himself looping and aggressively retrying tasks in gaming, could do the same with his work, with promise of being able to work through stress points better.  Discussed strategies to slow down, talk it out, try it wrong, or switch tasks and come back (e.g., switch to physical, or switch attention to something automatic/mundane, like counting the letter a's in a piece of code) as ways of breaking up self-perpetuating states of stress.  Therapeutic modalities: Cognitive Behavioral Therapy and Solution-Oriented/Positive Psychology  Mental Status/Observations:  Appearance:   Not assessed     Behavior:  Appropriate  Motor:  Not assessed  Speech/Language:   Clear and Coherent and more energetic  Affect:  Not assessed  Mood:  normal  Thought process:  normal  Thought content:    WNL  Sensory/Perceptual disturbances:    WNL  Orientation:  Fully oriented  Attention:  Good    Concentration:  Good  Memory:  WNL  Insight:    Good  Judgment:   Good  Impulse Control:  Good   Risk Assessment: Danger to Self: No Self-injurious Behavior: No Danger to Others: No Physical Aggression / Violence: No Duty to Warn:  No Access to Firearms a concern: No  Assessment of progress:  progressing  Diagnosis:   ICD-10-CM   1. Attention deficit hyperactivity disorder (ADHD), predominantly inattentive type  F90.0     2. 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 Transient hypersomnia (to be assessed by neurology, r/o CO2, ammonia, and  unnoticed sleep degradation)  G47.19    may be responding to mirtazapine qhs     Plan:  Use the four tools to break up self-perpetuating stress states Follow through engaging classes Practice permission to be the outlier in the room and "limping" through workouts if self-conscious Other recommendations/advice as may be noted above Continue to utilize previously learned skills ad lib Maintain medication as prescribed and work faithfully with relevant prescriber(s) if any changes are desired or seem indicated Call the clinic on-call service, 988/hotline, 911, or present to [facility] or ER if any life-threatening psychiatric crisis Return for session(s) already scheduled. Already scheduled visit in this office 01/19/2021.  Robley Fries, PhD [name], PhD LP Clinical Psychologist, [facility] [address], [address] [place], [place] [address] [phone]

## 2021-01-19 ENCOUNTER — Other Ambulatory Visit: Payer: Self-pay

## 2021-01-19 ENCOUNTER — Ambulatory Visit (INDEPENDENT_AMBULATORY_CARE_PROVIDER_SITE_OTHER): Payer: 59 | Admitting: Psychiatry

## 2021-01-19 DIAGNOSIS — F1910 Other psychoactive substance abuse, uncomplicated: Secondary | ICD-10-CM

## 2021-01-19 DIAGNOSIS — F900 Attention-deficit hyperactivity disorder, predominantly inattentive type: Secondary | ICD-10-CM | POA: Diagnosis not present

## 2021-01-19 DIAGNOSIS — F3341 Major depressive disorder, recurrent, in partial remission: Secondary | ICD-10-CM | POA: Diagnosis not present

## 2021-01-19 DIAGNOSIS — G4719 Other hypersomnia: Secondary | ICD-10-CM

## 2021-01-19 DIAGNOSIS — F4010 Social phobia, unspecified: Secondary | ICD-10-CM | POA: Diagnosis not present

## 2021-01-19 NOTE — Progress Notes (Signed)
Psychotherapy Progress Note [facility] [name], PhD LP  Patient ID: Louis [name] [facility])    MRN: [id number] Therapy format: Individual psychotherapy Date: 01/19/2021      Start: 4:16p     Stop: 5:05p     Time Spent: 49 min Location: In-person   Session narrative (presenting needs, interim history, self-report of stressors and symptoms, applications of prior therapy, status changes, and interventions made in session) First week of school this week, feels it's going well.  Brings reasonable accommodations instructions for [employer] -- will need a letter to validate extra time and sequestered testing, which were his [facility] accommodations.  Agreed, will write once I receive documentation of his original ADHD diagnosis, needed for [employer]'s process.  Taking 3 courses as a Hydrologist.  Arriving on time, credits sleep medication (mirtazapine) with resting better.  Still no intrusive sleep episodes.  Ritalin helping daytime, taking specifically for classes.  Enjoying being back on campus, feels like he fits in more, seeing other students.  Feeling more capable of finishing, either at [facility] [employer] [facility] or [employer].  Working relationship with parents stable, both approve his handling of 1st week.  Lingering worry on their part that if he does speed-gaming (which he says is mainly after schoolwork, while still on stimulant), he will fall back in to procrastination and wind up failing his opportunity.  They see gaming as a time waster only, while he sees it as pressure release, change of focus when needed, and a source of recreation and excelling at something.  Discussed and recommended he acknowledge their concern, and try to make spontaneous offers of household help once in a while, especially if it's something visible to mother.  Will back him up if needed on the complex and partly beneficial role of gaming if needed.  Wants to pick up a job, as well.  Good ideas about how to go beyond the [employer]  resume service.  Could make actual calls to get himself noticed.  Re. substance abuse, does not feel particularly tempted to pot or anything harder.  Feels sure he will stick with school day sobriety. When he does use, restraining to Delta-8 or CBD, and only when he can truly afford chill time, not when his attention and effort are needed up for other things.  Parents do want him to stop nicotine vape.  Will consider.  Therapeutic modalities: Cognitive Behavioral Therapy and Solution-Oriented/Positive Psychology  Mental Status/Observations:  Appearance:   Casual     Behavior:  Appropriate  Motor:  Normal  Speech/Language:   Clear and Coherent  Affect:  Appropriate  Mood:  normal and less anxious  Thought process:  normal  Thought content:    WNL  Sensory/Perceptual disturbances:    WNL  Orientation:  Fully oriented  Attention:  Good    Concentration:  Good  Memory:  WNL  Insight:    Good  Judgment:   Good  Impulse Control:  Good   Risk Assessment: Danger to Self: No Self-injurious Behavior: No Danger to Others: No Physical Aggression / Violence: No Duty to Warn: No Access to Firearms a concern: No  Assessment of progress:  progressing well  Diagnosis:   ICD-10-CM   1. Attention deficit hyperactivity disorder (ADHD), predominantly inattentive type  F90.0     2. 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 Transient hypersomnia (to be assessed by neurology, r/o CO2, ammonia, and unnoticed sleep degradation)  G47.19     4. Major depressive disorder,  recurrent, in partial remission (HCC)  F33.41     5. Polysubstance abuse (HCC) - improved  F19.10      Plan:  Maintain school attendance and prompt work Back up mirtazapine to prevent morning sluggishness Endorse gaming interest but be sure not to let it displace adequate work time Spontaneous offers of help within the house for goodwill with parents Other recommendations/advice as may be noted above Continue to utilize  previously learned skills ad lib Maintain medication as prescribed and work faithfully with relevant prescriber(s) if any changes are desired or seem indicated Call the clinic on-call service, 988/hotline, 911, or present to [facility] or ER if any life-threatening psychiatric crisis Return for session(s) already scheduled. Already scheduled visit in this office 02/01/2021.  Robley Fries, PhD [name], PhD LP Clinical Psychologist, [facility] [address], [address] [place], [place] [address] [phone]

## 2021-02-01 ENCOUNTER — Ambulatory Visit (INDEPENDENT_AMBULATORY_CARE_PROVIDER_SITE_OTHER): Payer: 59 | Admitting: Psychiatry

## 2021-02-01 DIAGNOSIS — F900 Attention-deficit hyperactivity disorder, predominantly inattentive type: Secondary | ICD-10-CM | POA: Diagnosis not present

## 2021-02-01 DIAGNOSIS — G4719 Other hypersomnia: Secondary | ICD-10-CM | POA: Diagnosis not present

## 2021-02-01 DIAGNOSIS — F4010 Social phobia, unspecified: Secondary | ICD-10-CM | POA: Diagnosis not present

## 2021-02-01 NOTE — Progress Notes (Signed)
Psychotherapy Progress Note [facility] Louis Czar, PhD LP  Patient ID: [name])    MRN: [id number] Therapy format: Individual psychotherapy Date: 02/01/2021      Start: 4:19p     Stop: 5:05p     Time Spent: 46 min Location: Telehealth visit -- I connected with this patient by an approved telecommunication method (video), with his informed consent, and verifying identity and patient privacy.  I was located at my office and patient at his home.  As needed, we discussed the limitations, risks, and security and privacy concerns associated with telehealth service, including the availability and conditions which currently govern in-person appointments and the possibility that 3rd-party payment may not be fully guaranteed and he may be responsible for charges.  After he indicated understanding, we proceeded with the session.  Also discussed treatment planning, as needed, including ongoing verbal agreement with the plan, the opportunity to ask and answer all questions, his demonstrated understanding of instructions, and his readiness to call the office should symptoms worsen or he feels he is in a crisis state and needs more immediate and tangible assistance.   Session narrative (presenting needs, interim history, self-report of stressors and symptoms, applications of prior therapy, status changes, and interventions made in session) Has gotten in touch with [facility], Dr. Marisue [name], who originally diagnosed ADHD to obtain her diagnostic report in support of his bid for accommodations at [facility].    Some anxiety noted as he gets further into the semester.  Anxiety maxes 6.5/10, figures could be Ritalin, could be just being back in academia.  Is witnessing better interaction and pace in the classes he's taking.  Eager to encounter the first big coding project next week.  Finding both that he retained more than he thought the first time around and that the  rest is learnable.  No qualms about impressions of, and working relationships with, his instructors.  Has not had occasion yet to make use of the TA service, but is optimistic about how it works and figures to go soon to a workshop they offer.  Has shared the good news with parents about how school engagement is working out, and they seem to be at least cautiously optimistic with him.  Noted today a wave of intrusive sleepiness in class that he could overcome.  Figures Ritalin was effective keeping him alert enough to stay on task.  While he has been getting benefit of mirtazapine, says it is also causing intrusive hunger before bedtime (c. 11:30p) since he backed up dose time from 11p to 10p.  Found it knocked him out hard in the beginning but not so hard now.  Still helpful, satisfied with its effect helping him embark on sleep at a civil time.  No problems being ready for class on time in the morning.  Night munchies have been enough to make him rise and snack in the kitchen, but still succeeding in keeping late food out of his room.  Resolved to adjust mirtazapine timing, see if he can get enough sleepy effect without appetite before falling asleep.  Advise psychiatry and sleep specialist both if he experiences sleepwalking/sleep-eating.  Sleep study still on for March.  Reports he "got jinxed" into long phone interaction with scammers, enough to compromise his Louis [name] account information but stopped it short of losing money.  Did report to the [facility], but still absorbing the fact of how long they had him going in the scheme and got really  upset with himself yesterday.  Annoyed how they call back, temporarily played along as a way of wasting their time the way they had wasted his, until he realized it was fruitless and would only waste more of his own time.  Called up memories of his mother dealing with similar scam calls and trying unsuccessfully to shame callers about what they were doing.  Also called up  memories of sketchy teacher experiences in grade school and high school, including one PE teacher who watched in the locker room to "make sure" all the boys changed clothes, and another who went irrationally militant on [place] and called his parents to characterize him as a bad kid.  Re. his speed-gaming pastime, mother still concerned he'll get lost in it, says she will still get irritated if she looks in a couple times and each time he's gaming.  Been keeping his cool and just explaining he's on a break.  Claims he is keeping up with his work.  Suggested he offer to show her his work if she needs to verify, let transparency do the reassuring.  Therapeutic modalities: Cognitive Behavioral Therapy and Solution-Oriented/Positive Psychology  Mental Status/Observations:  Appearance:   Casual     Behavior:  Appropriate  Motor:  Normal  Speech/Language:   Clear and Coherent  Affect:  Appropriate  Mood:  normal  Thought process:  normal  Thought content:    WNL  Sensory/Perceptual disturbances:    WNL  Orientation:  Fully oriented  Attention:  Good    Concentration:  Good  Memory:  WNL  Insight:    Good  Judgment:   Good  Impulse Control:  Good   Risk Assessment: Danger to Self: No Self-injurious Behavior: No Danger to Others: No Physical Aggression / Violence: No Duty to Warn: No Access to Firearms a concern: No  Assessment of progress:  progressing  Diagnosis:   ICD-10-CM   1. Attention deficit hyperactivity disorder (ADHD), predominantly inattentive type  F90.0     2. 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 Transient hypersomnia (to be assessed by neurology; r/o CO2, ammonia, and unnoticed sleep interference as causes)  G47.19      Plan:  Louis [name] to recover and provide Louis [name] diagnostic report, I write up recommendation letter for academic accommodations at [facility] Adjust mirtazapine timing to optimize the timing of sleepy and hungry effects, report to psychiatry and  neurology if he experiences sleepwalking/sleep-eating Maintain school attendance and prompt work Spontaneous offers of help within the house Other recommendations/advice as may be noted above Continue to utilize previously learned skills ad lib Maintain medication as prescribed and work faithfully with relevant prescriber(s) if any changes are desired or seem indicated Call the clinic on-call service, 988/hotline, 911, or present to [facility] or ER if any life-threatening psychiatric crisis Return for session(s) already scheduled. Already scheduled visit in this office 02/06/2021 (med check), [place] 02/08/21.  Robley Fries, PhD Louis Czar, PhD LP Clinical Psychologist, [facility] [address], [address] [place], [place] [address] [phone]

## 2021-02-06 ENCOUNTER — Encounter: Payer: Self-pay | Admitting: Adult Health

## 2021-02-06 ENCOUNTER — Other Ambulatory Visit: Payer: Self-pay

## 2021-02-06 ENCOUNTER — Ambulatory Visit (INDEPENDENT_AMBULATORY_CARE_PROVIDER_SITE_OTHER): Payer: 59 | Admitting: Adult Health

## 2021-02-06 DIAGNOSIS — F4010 Social phobia, unspecified: Secondary | ICD-10-CM

## 2021-02-06 DIAGNOSIS — F900 Attention-deficit hyperactivity disorder, predominantly inattentive type: Secondary | ICD-10-CM

## 2021-02-06 DIAGNOSIS — F3341 Major depressive disorder, recurrent, in partial remission: Secondary | ICD-10-CM | POA: Diagnosis not present

## 2021-02-06 DIAGNOSIS — F5105 Insomnia due to other mental disorder: Secondary | ICD-10-CM

## 2021-02-06 DIAGNOSIS — F909 Attention-deficit hyperactivity disorder, unspecified type: Secondary | ICD-10-CM | POA: Insufficient documentation

## 2021-02-06 DIAGNOSIS — F99 Mental disorder, not otherwise specified: Secondary | ICD-10-CM

## 2021-02-06 DIAGNOSIS — F411 Generalized anxiety disorder: Secondary | ICD-10-CM | POA: Diagnosis not present

## 2021-02-06 MED ORDER — TRAZODONE HCL 100 MG PO TABS: ORAL_TABLET | ORAL | 2 refills | Status: DC

## 2021-02-06 MED ORDER — METHYLPHENIDATE HCL ER (OSM) 36 MG PO TBCR: 36.0000 mg | EXTENDED_RELEASE_TABLET | Freq: Every day | ORAL | 0 refills | Status: DC

## 2021-02-06 NOTE — Progress Notes (Signed)
Louis [name] [id number] [birth year] 26 y.o.  Subjective:   Patient ID:  Louis [name] is a 26 y.o. (DOB 05-15-1995) male.  Chief Complaint: No chief complaint on file.   HPI Louis [name] presents to the office today for follow-up of MDD, ADHD, GAD, polysubstance abuse.  Describes mood today as "ok". Pleasant. Flat. Mood symptoms - reports some depression, anxiety and irritability. Feels anxious during the day and depressed in the evening. Reports a few panic attacks - minor comparatively. Stating "I feel like I'm doing ok". Feels like medications are helpful - concerned about weight gain. Improved interest and motivation. Taking medications as prescribed.  Energy levels lower. Active, has a regular exercise routine.   Enjoys some usual interests and activities. Single. Not dating. Lives with parents and cat. Spending time with family and friends. Appetite adequate. Weight gain - 205 to 212 pounds. Sleeps well most nights. Averages 8 hours.  Focus and concentration improved. Completing tasks. Managing aspects of household. Taking 3 classes - [employer] in January. Denies SI or HI.  Denies AH or VH. Substance use - nicotine vaping. History of THC. History of ETOH use - elevated LFT's. Following up with Dr. Leanora Ivanoff at [facility].  Completed IOP program at [facility].  Previous medication trials: Prozac, Wellbutrin, Vyvanse, Adderall   PHQ2-9    Flowsheet Row Counselor from 05/03/2020 in [facility]  PHQ-2 Total Score 5  PHQ-9 Total Score 20        Review of Systems:  Review of Systems  Musculoskeletal:  Negative for gait problem.  Neurological:  Negative for tremors.  Psychiatric/Behavioral:         Please refer to HPI   Medications: I have reviewed the patient's current medications.  Current Outpatient Medications  Medication Sig Dispense Refill   traZODone (DESYREL) 100 MG tablet Take 1/2 tablet at hs x 7, then one tablet daily. 30 tablet 2    escitalopram (LEXAPRO) 20 MG tablet Take 1 tablet (20 mg total) by mouth daily. 30 tablet 5   hydrOXYzine (VISTARIL) 25 MG capsule Take 1 capsule (25 mg total) by mouth 3 (three) times daily as needed. 90 capsule 0   methylphenidate (CONCERTA) 36 MG PO CR tablet Take 1 tablet (36 mg total) by mouth daily. 30 tablet 0   No current facility-administered medications for this visit.    Medication Side Effects: None  Allergies: No Known Allergies  Past Medical History:  Diagnosis Date   ADHD     Past Medical History, Surgical history, Social history, and Family history were reviewed and updated as appropriate.   Please see review of systems for further details on the patient's review from today.   Objective:   Physical Exam:  There were no vitals taken for this visit.  Physical Exam Constitutional:      General: He is not in acute distress. Musculoskeletal:        General: No deformity.  Neurological:     Mental Status: He is alert and oriented to person, place, and time.     Coordination: Coordination normal.  Psychiatric:        Attention and Perception: Attention and perception normal. He does not perceive auditory or visual hallucinations.        Mood and Affect: Mood normal. Mood is not anxious or depressed. Affect is not labile, blunt, angry or inappropriate.        Speech: Speech normal.        Behavior: Behavior normal.  Thought Content: Thought content normal. Thought content is not paranoid or delusional. Thought content does not include homicidal or suicidal ideation. Thought content does not include homicidal or suicidal plan.        Cognition and Memory: Cognition and memory normal.        Judgment: Judgment normal.     Comments: Insight intact    Lab Review:  No results found for: NA, K, CL, CO2, GLUCOSE, BUN, CREATININE, CALCIUM, PROT, ALBUMIN, AST, ALT, ALKPHOS, BILITOT, GFRNONAA, GFRAA  No results found for: WBC, RBC, HGB, HCT, PLT, MCV, MCH, MCHC,  RDW, LYMPHSABS, MONOABS, EOSABS, BASOSABS  No results found for: POCLITH, LITHIUM   No results found for: PHENYTOIN, PHENOBARB, VALPROATE, CBMZ   .res Assessment: Plan:    Plan:  PDMP reviewed  Continue Lexapro 20mg  daily  D/C Remeron 15mg  at bedtime - 1/2 tab at hs x 7 days, then leave off Add Trazadone 100mg  - 1/2 tab at hs x 7 then one tab at hs Continue Concerta 36mg  daily  BP - 111/68 77  Working with Dr testing results given for review.  Has ceased substance use.  RTC 4 weeks  Patient advised to contact office with any questions, adverse effects, or acute worsening in signs and symptoms. Diagnoses and all orders for this visit:  Attention deficit hyperactivity disorder (ADHD), predominantly inattentive type -     methylphenidate (CONCERTA) 36 MG PO CR tablet; Take 1 tablet (36 mg total) by mouth daily.  Social anxiety disorder  Major depressive disorder, recurrent, in partial remission (HCC)  Generalized anxiety disorder  Insomnia due to other mental disorder -     traZODone (DESYREL) 100 MG tablet; Take 1/2 tablet at hs x 7, then one tablet daily.     Please see After Visit Summary for patient specific instructions.  Future Appointments  Date Time Provider Department Center  02/08/2021  4:00 PM , PhD CP-CP None  02/15/2021  4:00 PM Gabrielle Dare, PhD CP-CP None  02/23/2021  3:00 PM Robley Fries, PhD CP-CP None  03/06/2021  9:00 AM Omere Marti, Robley Fries, NP CP-CP None  03/09/2021 11:00 AM Robley Fries, PhD CP-CP None  03/12/2021  3:00 PM Dohmeier, [name], MD [facility] None  03/16/2021  3:00 PM Robley Fries, PhD CP-CP None  03/23/2021  4:00 PM Mitchum, Porfirio Mylar, PhD CP-CP None    No orders of the defined types were placed in this encounter.   -------------------------------

## 2021-02-08 ENCOUNTER — Other Ambulatory Visit: Payer: Self-pay

## 2021-02-08 ENCOUNTER — Encounter: Payer: Self-pay | Admitting: Adult Health

## 2021-02-08 ENCOUNTER — Ambulatory Visit (INDEPENDENT_AMBULATORY_CARE_PROVIDER_SITE_OTHER): Payer: 59 | Admitting: Psychiatry

## 2021-02-08 DIAGNOSIS — G4719 Other hypersomnia: Secondary | ICD-10-CM | POA: Diagnosis not present

## 2021-02-08 DIAGNOSIS — F411 Generalized anxiety disorder: Secondary | ICD-10-CM

## 2021-02-08 DIAGNOSIS — F900 Attention-deficit hyperactivity disorder, predominantly inattentive type: Secondary | ICD-10-CM | POA: Diagnosis not present

## 2021-02-08 DIAGNOSIS — F3341 Major depressive disorder, recurrent, in partial remission: Secondary | ICD-10-CM

## 2021-02-08 NOTE — Progress Notes (Signed)
Psychotherapy Progress Note [facility] [name], PhD LP  Patient ID: Louis [name] [facility])    MRN: [id number] Therapy format: Individual psychotherapy Date: 02/08/2021      Start: 4:10p     Stop: 5:00p     Time Spent: 50 min Location: In-person   Session narrative (presenting needs, interim history, self-report of stressors and symptoms, applications of prior therapy, status changes, and interventions made in session) Brings ADHD eval as provided by original diagnostician and a note endorsing academic accommodations, reviewed briefly, looks sufficient, drafted accommodations letter for [facility] in session.  Studies continue working at [employer].  Continues to [employer] over which campus to finish, as if it may be crucial to his future, but at least voices realistic sense that there are two "right" choices, good reasons for each, and that finishing through [employer] is not crucial, just the path he took at the time.  Does value access to tutoring and more individualizable attention learning.  Does feel he has access now, knows well enough how to make use of it when needed.  Re. nighttime sxs, tried taking mirtazapine a little later but still had unstoppable munchies.  Switched to trazodone this week, early on now but and notices some lessening of impulse to eat.  Still the habit is strong, still eating late, but believes he can shut it back down.  Was surprised to see that he was 200lbs at 5'8" and 26yo at time of ADHD eval.    Anxiety about homework down since beginning the coding project got started.  Some lingering anxiety in classroom, maybe about fear of falling asleep (never has), not really about how he looks to others (aware of being older by a couple years).  Figure it's mostly about self-doubts with school and performance.  Can take an unnecessary trip to the bathroom to walk off nerves.    Genetic testing with Dr. 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  Says he's had results since last summer (order was 06/28/20), genotype explains why former antidepressant choices weren't working.    Says he has tried making a few spontaneous offers of work in the house, thinks it has helped with how he's perceived by parents, mother especially.  Discussed current status of trust and parents' appraisal, note it's been a good while since we were in touch, want to make sure they still know their perspective matters.  Request he solicit questions or needs they may have for [place], or else invite one or both to a session soon for check-in  Therapeutic modalities: Cognitive Behavioral Therapy and Solution-Oriented/Positive Psychology  Mental Status/Observations:  Appearance:   Casual     Behavior:  Appropriate  Motor:  Restlestness  Speech/Language:   Clear and Coherent  Affect:  Constricted  Mood:  anxious, limited  Thought process:  normal  Thought content:    WNL  Sensory/Perceptual disturbances:    WNL  Orientation:  Fully oriented  Attention:  Good, but struggling a bit to stay awake near end    Concentration:  Good  Memory:  WNL  Insight:    Good  Judgment:   Good  Impulse Control:  Good   Risk Assessment: Danger to Self: No Self-injurious Behavior: No Danger to Others: No Physical Aggression / Violence: No Duty to Warn: No Access to Firearms a concern: No  Assessment of progress:  progressing  Diagnosis:   ICD-10-CM   1. Attention deficit hyperactivity disorder (ADHD), predominantly inattentive type  F90.0     2. Generalized anxiety disorder  F41.1     3. Major depressive disorder, recurrent, in partial remission (HCC)  F33.41     4. Transient hypersomnia (to be assessed by neurology; r/o CO2, ammonia, and unnoticed sleep interference as causes)  G47.19      Plan:  Maintain attendance and homework Deliver accommodations letter with supporting eval Inquire with, or invite, parents for feedback on progress, issues they see  Try  to tamp down night eating again For classroom anxiety, OK to take bathroom break, just be ure it doesn't escalate to habit.  Self-talk PRN that it's natural to feel anxiety whether he'll be OK this time around, but it is already working to good, and yes, he will come through, just work today's work paying attention and putting in the effort For goodwill at home, recommend continuing to make spontaneous offers now and then to help with something Other recommendations/advice as may be noted above Continue to utilize previously learned skills ad lib Maintain medication as prescribed and work faithfully with relevant prescriber(s) if any changes are desired or seem indicated Call the clinic on-call service, 988/hotline, 911, or present to [facility] or ER if any life-threatening psychiatric crisis No follow-ups on file. Already scheduled visit in this office 02/15/2021.  Robley Fries, PhD [name], PhD LP Clinical Psychologist, [facility] [address] [place], [place] [address] [phone]

## 2021-02-15 ENCOUNTER — Other Ambulatory Visit: Payer: Self-pay

## 2021-02-15 ENCOUNTER — Ambulatory Visit (INDEPENDENT_AMBULATORY_CARE_PROVIDER_SITE_OTHER): Payer: 59 | Admitting: Psychiatry

## 2021-02-15 DIAGNOSIS — F900 Attention-deficit hyperactivity disorder, predominantly inattentive type: Secondary | ICD-10-CM | POA: Diagnosis not present

## 2021-02-15 DIAGNOSIS — G4719 Other hypersomnia: Secondary | ICD-10-CM | POA: Diagnosis not present

## 2021-02-15 DIAGNOSIS — F1910 Other psychoactive substance abuse, uncomplicated: Secondary | ICD-10-CM

## 2021-02-15 DIAGNOSIS — F3341 Major depressive disorder, recurrent, in partial remission: Secondary | ICD-10-CM | POA: Diagnosis not present

## 2021-02-15 DIAGNOSIS — F411 Generalized anxiety disorder: Secondary | ICD-10-CM
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
## 2021-02-23 ENCOUNTER — Other Ambulatory Visit: Payer: Self-pay

## 2021-02-23 ENCOUNTER — Ambulatory Visit (INDEPENDENT_AMBULATORY_CARE_PROVIDER_SITE_OTHER): Payer: 59 | Admitting: Psychiatry

## 2021-02-23 DIAGNOSIS — F900 Attention-deficit hyperactivity disorder, predominantly inattentive type: Secondary | ICD-10-CM | POA: Diagnosis not present

## 2021-02-23 DIAGNOSIS — G4719 Other hypersomnia: Secondary | ICD-10-CM | POA: Diagnosis not present

## 2021-02-23 DIAGNOSIS — F3341 Major depressive disorder, recurrent, in partial remission: Secondary | ICD-10-CM

## 2021-02-23 DIAGNOSIS — F411 Generalized anxiety disorder: Secondary | ICD-10-CM

## 2021-02-23 NOTE — Progress Notes (Signed)
Psychotherapy Progress Note [facility] [name], PhD LP  Patient ID: Louis [name] [facility])    MRN: [id number] Therapy format: Individual psychotherapy Date: 02/23/2021      Start: 3:18p     Stop: 3:58p     Time Spent: 40 min Location: In-person   Session narrative (presenting needs, interim history, self-report of stressors and symptoms, applications of prior therapy, status changes, and interventions made in session) Delayed arrival, plus urgent trip to toilet.  Night eating is improving.  Seems to go up and down with stress, including things like having to do daily medications for cat, newly diagnosed with asthma.  Caught a cold himself now.  Parents left early this morning on vacation, up early .  Encouraged in resisting night eating, and using relaxation skill instead of resorting to food.    Concern for this weekend that he will be tempted to shut in until Monday morning.  Has two exams over the weekend.  Seems daunting to face time handling tests, some socializing, and routine high maintenance situation with the cats.  (One can only walk on its forearms, a couple are going out side the litter box, one knows how to turn on faucets...).  Has boarded parents' elderly cats (Mom's idea, humanely), just needs to clean up what got left.  Offered a couple ideas for delivering meds if resisted.    Notices he can "circle" some when feeling under pressure.  Discussed value of noticing and going ahead to engage a priority at hand, also planning time and setting electronic reminders in order to reduce ambivalence and just engage when the time comes.  Also briefly addressed setting low distraction environment for for test-taking.    Otherwise, job interview went well, essentially hired, just trying to sort schedule.  Accommodations are established now with [employer].  No followup needs for certification.  No strong sleep attacks, just a couple pushy moments in class but no involuntary  falling asleep.  Today overslept his alarm, having had a short night and early rise to take parents to airport.  Affirmed and encouraged in actions decided.  Therapeutic modalities: Cognitive Behavioral Therapy and Solution-Oriented/Positive Psychology  Mental Status/Observations:  Appearance:   Casual and all black      Behavior:  Appropriate  Motor:  fidgeting  Speech/Language:   Clear and Coherent  Affect:  Appropriate  Mood:  anxious  Thought process:  normal  Thought content:    WNL, worry  Sensory/Perceptual disturbances:    WNL  Orientation:  Fully oriented  Attention:  Good    Concentration:  Fair  Memory:  WNL  Insight:    Good  Judgment:   Good  Impulse Control:  Good   Risk Assessment: Danger to Self: No Self-injurious Behavior: No Danger to Others: No Physical Aggression / Violence: No Duty to Warn: No Access to Firearms a concern: No  Assessment of progress:  progressing  Diagnosis: No diagnosis found. Plan:  Resolved to block time this weekend for responsibilities, and social, and recreation rather than leave too much up to the feeling of the moment.  Practice showing up going into action on whatever he has booked, as close to book time a possible. Prepare test environment that is most conducive -- if the retreat of his room works best, OK, if getting away from the distractions of his room works bet, somewhere else. Keep working at resisting late food, try relaxation exercise if it's about anxiety Other recommendations/advice as may be noted  above Continue to utilize previously learned skills ad lib Maintain medication as prescribed and work faithfully with relevant prescriber(s) if any changes are desired or seem indicated Call the clinic on-call service, 988/hotline, 911, or present to [facility] or ER if any life-threatening psychiatric crisis Return for session(s) already scheduled. Already scheduled visit in this office 03/06/2021.  Robley Fries, PhD [name], PhD LP Clinical Psychologist, [facility] [address], [address] [place], [place] [address] [phone]

## 2021-03-06 ENCOUNTER — Ambulatory Visit: Payer: 59 | Admitting: Adult Health

## 2021-03-06 NOTE — Progress Notes (Signed)
Patient no show appointment. ? ?

## 2021-03-09 ENCOUNTER — Ambulatory Visit (INDEPENDENT_AMBULATORY_CARE_PROVIDER_SITE_OTHER): Payer: 59 | Admitting: Psychiatry

## 2021-03-09 ENCOUNTER — Other Ambulatory Visit: Payer: Self-pay

## 2021-03-09 DIAGNOSIS — F4010 Social phobia, unspecified: Secondary | ICD-10-CM

## 2021-03-09 DIAGNOSIS — F900 Attention-deficit hyperactivity disorder, predominantly inattentive type: Secondary | ICD-10-CM | POA: Diagnosis not present

## 2021-03-09 DIAGNOSIS — F331 Major depressive disorder, recurrent, moderate: Secondary | ICD-10-CM

## 2021-03-09 DIAGNOSIS — F1910 Other psychoactive substance abuse, uncomplicated: Secondary | ICD-10-CM

## 2021-03-09 DIAGNOSIS — G4719 Other hypersomnia: Secondary | ICD-10-CM

## 2021-03-09 DIAGNOSIS — F411 Generalized anxiety disorder: Secondary | ICD-10-CM

## 2021-03-09 NOTE — Progress Notes (Unsigned)
Psychotherapy Progress Note [facility] [name], PhD LP  Patient ID: Louis [name] [facility])    MRN: [id number] Therapy format: Individual psychotherapy Date: 03/09/2021      Start: 11:23a     Stop: ***:***     Time Spent: *** min Location: In-person   Session narrative (presenting needs, interim history, self-report of stressors and symptoms, applications of prior therapy, status changes, and interventions made in session) Weekend while parents were away went OK, though stressed taking care of house and cat, who needed multiple meds.  Found the same helpless feeling coming on through the weekend.  School anxious, though he has been getting As and Bs routinely.  Keeps fearing the next assignment will fail.  Able to get the tutoring he needs, unlike [facility] experience.  Acknowledges he's been doing a lot of nicotine gum, instead of smoking or vaping.  May be part of his emotional response that he is making that change.  Agreed he is doing the right things, the trick is to stick with them, enjoy his break, see it through to actually see that he could reorgnaize and succeed.    Cat is doing better, but may need an inhaler, depending on how he responds further.  Parents pleased at trying to kick smoking, feels they are pleased at his academic efforts and his recovery overall.  Still concerns with delayed appetite, and inconsistent ability to refrain from night eating.  Down, but still can get up after going to bed, to eat.  Discussed MCT oil as a carb-cycle breaker and reliever of carb hunger he could use as the one thing he eats after, say 11pm.    Academically, had one hiccup with an assignment coming back zero b/c prof couldn't run his program -- turned out to be the interface he submitted didn't work correctly.  Was able to check with prof, resubmit, got 100.  Affirmed the persistence and self-advocacy, acknowledged that getting the instructions for tasks will be part of ADHD  management for a good while, so working out how he notices and remedies   Job search cooler but still under way.  Denies either feeling overloaded or bored right now.    1st meeting with Dr. Vickey Huger.    Therapeutic modalities: {AM:23362::"Cognitive Behavioral Therapy","Solution-Oriented/Positive Psychology"}  Mental Status/Observations:  Appearance:   {PSY:22683}     Behavior:  {PSY:21022743}  Motor:  {PSY:22302}  Speech/Language:   {PSY:22685}  Affect:  {PSY:22687}  Mood:  {PSY:31886}  Thought process:  {PSY:31888}  Thought content:    {[phone]}  Sensory/Perceptual disturbances:    {[phone]}  Orientation:  {Psych Orientation:23301::"Fully oriented"}  Attention:  {Good-Fair-Poor ratings:23770::"Good"}    Concentration:  {Good-Fair-Poor ratings:23770::"Good"}  Memory:  {PSY:[phone]}  Insight:    {Good-Fair-Poor ratings:23770::"Good"}  Judgment:   {Good-Fair-Poor ratings:23770::"Good"}  Impulse Control:  {Good-Fair-Poor ratings:23770::"Good"}   Risk Assessment: Danger to Self: {Risk:22599::"No"} Self-injurious Behavior: {Risk:22599::"No"} Danger to Others: {Risk:22599::"No"} Physical Aggression / Violence: {Risk:22599::"No"} Duty to Warn: {AMYesNo:22526::"No"} Access to Firearms a concern: {AMYesNo:22526::"No"}  Assessment of progress:  {Progress:22147::"progressing"}  Diagnosis: No diagnosis found. Plan:  *** MCT oil offered as a tool for reshaping night hunger  Other recommendations/advice as may be noted above Continue to utilize previously learned skills ad lib Maintain medication as prescribed and work faithfully with relevant prescriber(s) if any changes are desired or seem indicated Call the clinic on-call service, 988/hotline, 911, or present to [facility] or ER if any life-threatening psychiatric crisis No follow-ups on file. Already scheduled visit in this  office 03/16/2021.  Robley Fries, PhD [name], PhD LP Clinical Psychologist, [facility] [address] [place], [place] [address] [phone]

## 2021-03-12 ENCOUNTER — Encounter: Payer: Self-pay | Admitting: Neurology

## 2021-03-12 ENCOUNTER — Ambulatory Visit (INDEPENDENT_AMBULATORY_CARE_PROVIDER_SITE_OTHER): Payer: 59 | Admitting: Neurology

## 2021-03-12 VITALS — BP 109/69 | HR 71 | Ht 70.0 in | Wt 226.0 lb

## 2021-03-12 DIAGNOSIS — G4711 Idiopathic hypersomnia with long sleep time: Secondary | ICD-10-CM | POA: Diagnosis not present

## 2021-03-12 DIAGNOSIS — F411 Generalized anxiety disorder: Secondary | ICD-10-CM | POA: Diagnosis not present

## 2021-03-12 DIAGNOSIS — G4719 Other hypersomnia: Secondary | ICD-10-CM | POA: Diagnosis not present

## 2021-03-12 DIAGNOSIS — R0683 Snoring: Secondary | ICD-10-CM | POA: Diagnosis not present

## 2021-03-12 DIAGNOSIS — F901 Attention-deficit hyperactivity disorder, predominantly hyperactive type: Secondary | ICD-10-CM
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
## 2021-03-12 NOTE — Patient Instructions (Signed)
Quality Sleep Information, Adult °Quality sleep is important for your mental and physical health. It also improves your quality of life. Quality sleep means you: °Are asleep for most of the time you are in bed. °Fall asleep within 30 minutes. °Wake up no more than once a night.  °Are awake for no longer than 20 minutes if you do wake up during the night. °Most adults need 7-8 hours of quality sleep each night. °How can poor sleep affect me? °If you do not get enough quality sleep, you may have: °Mood swings. °Daytime sleepiness. °Confusion. °Decreased reaction time. °Sleep disorders, such as insomnia and sleep apnea. °Difficulty with: °Solving problems. °Coping with stress. °Paying attention. °These issues may affect your performance and productivity at work, school, and at home. Lack of sleep may also put you at higher risk for accidents, suicide, and risky behaviors. °If you do not get quality sleep you may also be at higher risk for several health problems, including: °Infections. °Type 2 diabetes. °Heart disease. °High blood pressure. °Obesity. °Worsening of long-term conditions, like arthritis, kidney disease, depression, Parkinson's disease, and epilepsy. °What actions can I take to get more quality sleep? °  °Stick to a sleep schedule. Go to sleep and wake up at about the same time each day. Do not try to sleep less on weekdays and make up for lost sleep on weekends. This does not work. °Try to get about 30 minutes of exercise on most days. Do not exercise 2-3 hours before going to bed. °Limit naps during the day to 30 minutes or less. °Do not use any products that contain nicotine or tobacco, such as cigarettes or e-cigarettes. If you need help quitting, ask your health care provider. °Do not drink caffeinated beverages for at least 8 hours before going to bed. Coffee, tea, and some sodas contain caffeine. °Do not drink alcohol close to bedtime. °Do not eat large meals close to bedtime. °Do not take naps in  the late afternoon. °Try to get at least 30 minutes of sunlight every day. Morning sunlight is best. °Make time to relax before bed. Reading, listening to music, or taking a hot bath promotes quality sleep. °Make your bedroom a place that promotes quality sleep. Keep your bedroom dark, quiet, and at a comfortable room temperature. Make sure your bed is comfortable. Take out sleep distractions like TV, a computer, smartphone, and bright lights. °If you are lying awake in bed for longer than 20 minutes, get up and do a relaxing activity until you feel sleepy. °Work with your health care provider to treat medical conditions that may affect sleeping, such as: °Nasal obstruction. °Snoring. °Sleep apnea and other sleep disorders. °Talk to your health care provider if you think any of your prescription medicines may cause you to have difficulty falling or staying asleep. °If you have sleep problems, talk with a sleep consultant. If you think you have a sleep disorder, talk with your health care provider about getting evaluated by a specialist. °Where to find more information °National Sleep Foundation website: https://sleepfoundation.org °National Heart, Lung, and Blood Institute (NHLBI): www.nhlbi.nih.gov/files/docs/public/sleep/healthy_sleep.pdf °Centers for Disease Control and Prevention (CDC): www.cdc.gov/sleep/index.html °Contact a health care provider if you: °Have trouble getting to sleep or staying asleep. °Often wake up very early in the morning and cannot get back to sleep. °Have daytime sleepiness. °Have daytime sleep attacks of suddenly falling asleep and sudden muscle weakness (narcolepsy). °Have a tingling sensation in your legs with a strong urge to move your legs (restless   legs syndrome). Stop breathing briefly during sleep (sleep apnea). Think you have a sleep disorder or are taking a medicine that is affecting your quality of sleep. Summary Most adults need 7-8 hours of quality sleep each  night. Getting enough quality sleep is an important part of health and well-being. Make your bedroom a place that promotes quality sleep and avoid things that may cause you to have poor sleep, such as alcohol, caffeine, smoking, and large meals. Talk to your health care provider if you have trouble falling asleep or staying asleep. This information is not intended to replace advice given to you by your health care provider. Make sure you discuss any questions you have with your health care provider. Document Revised: 04/02/2017 Document Reviewed: 04/02/2017 Elsevier Patient Education  2022 [place]. Hypersomnia Hypersomnia is a condition in which a person feels very tired during the day even though he or she gets plenty of sleep at night. A person with this condition may take naps during the day and may find it very difficult to wake up from sleep. Hypersomnia may affect a person's ability to think, concentrate, drive, or remember things. What are the causes? The cause of this condition may not be known. Possible causes include: Certain medicines. Sleep disorders, such as narcolepsy and sleep apnea. Injury to the head, brain, or spinal cord. Drug or alcohol use. Gastroesophageal reflux disease (GERD). Tumors. Certain medical conditions, such as depression, diabetes, or an underactive thyroid gland (hypothyroidism). What are the signs or symptoms? The main symptoms of hypersomnia include: Feeling very tired throughout the day, regardless of how much sleep you got the night before. Having trouble waking up. Others may find it difficult to wake you up when you are sleeping. Sleeping for longer and longer periods at a time. Taking naps throughout the day. Other symptoms may include: Feeling restless, anxious, or annoyed. Lacking energy. Having trouble with: Remembering. Speaking. Thinking. Loss of appetite. Seeing, hearing, tasting, smelling, or feeling things that are not real  (hallucinations). How is this diagnosed? This condition may be diagnosed based on: Your symptoms and medical history. Your sleeping habits. Your health care provider may ask you to write down your sleeping habits in a daily sleep log, along with any symptoms you have. A series of tests that are done while you sleep (sleep study or polysomnogram). A test that measures how quickly you can fall asleep during the day (daytime nap study or multiple sleep latency test). How is this treated? Treatment can help you manage your condition. Treatment may include: Following a regular sleep routine. Lifestyle changes, such as changing your eating habits, getting regular exercise, and avoiding alcohol or caffeinated beverages. Taking medicines to make you more alert (stimulants) during the day. Treating any underlying medical causes of hypersomnia. Follow these instructions at home: Sleep routine  Schedule the same bedtime and wake-up time each day. Practice a relaxing bedtime routine. This may include reading, meditation, deep breathing, or taking a warm bath before going to sleep. Get regular exercise each day. Avoid strenuous exercise in the evening hours. Keep your sleep environment at a cooler temperature, darkened, and quiet. Sleep with pillows and a mattress that are comfortable and supportive. Schedule short 20-minute naps for when you feel sleepiest during the day. Talk with your employer or teachers about your hypersomnia. If possible, adjust your schedule so that: You have a regular daytime work schedule. You can take a scheduled nap during the day. You do not have to work or  be active at night. Do not eat a heavy meal for a few hours before bedtime. Eat your meals at about the same times every day. Avoid drinking alcohol or caffeinated beverages. Safety  Do not drive or use heavy machinery if you are sleepy. Ask your health care provider if it is safe for you to drive. Wear a life jacket  when swimming or spending time near water. General instructions Take supplements and over-the-counter and prescription medicines only as told by your health care provider. Keep a sleep log that will help your doctor manage your condition. This may include information about: What time you go to bed each night. How often you wake up at night. How many hours you sleep at night. How often and for how long you nap during the day. Any observations from others, such as leg movements during sleep, sleep walking, or snoring. Keep all follow-up visits as told by your health care provider. This is important. Contact a health care provider if: You have new symptoms. Your symptoms get worse. Get help right away if: You have serious thoughts about hurting yourself or someone else. If you ever feel like you may hurt yourself or others, or have thoughts about taking your own life, get help right away. You can go to your nearest emergency department or call: Your local emergency services (911 in the [place] A suicide crisis helpline, such as the [facility] at 747-853-3066 or 988 in the [place]. This is open 24 hours a day. Summary Hypersomnia refers to a condition in which you feel very tired during the day even though you get plenty of sleep at night. A person with this condition may take naps during the day and may find it very difficult to wake up from sleep. Hypersomnia may affect a person's ability to think, concentrate, drive, or remember things. Treatment, such as following a regular sleep routine and making some lifestyle changes, can help you manage your condition. This information is not intended to replace advice given to you by your health care provider. Make sure you discuss any questions you have with your health care provider. Document Revised: 07/19/2020 Document Reviewed: 11/04/2019 Elsevier Patient Education  2022 [employer].

## 2021-03-16 ENCOUNTER — Other Ambulatory Visit: Payer: Self-pay

## 2021-03-16 ENCOUNTER — Ambulatory Visit (INDEPENDENT_AMBULATORY_CARE_PROVIDER_SITE_OTHER): Payer: 59 | Admitting: Psychiatry

## 2021-03-16 DIAGNOSIS — F900 Attention-deficit hyperactivity disorder, predominantly inattentive type: Secondary | ICD-10-CM | POA: Diagnosis not present

## 2021-03-16 DIAGNOSIS — G4719 Other hypersomnia: Secondary | ICD-10-CM

## 2021-03-16 DIAGNOSIS — F4010 Social phobia, unspecified: Secondary | ICD-10-CM | POA: Diagnosis not present

## 2021-03-16 DIAGNOSIS — F331 Major depressive disorder, recurrent, moderate: Secondary | ICD-10-CM | POA: Diagnosis not present

## 2021-03-16 DIAGNOSIS — F411 Generalized anxiety disorder: Secondary | ICD-10-CM

## 2021-03-16 DIAGNOSIS — F1910 Other psychoactive substance abuse, uncomplicated: Secondary | ICD-10-CM

## 2021-03-16 NOTE — Progress Notes (Unsigned)
Psychotherapy Progress Note [facility] Luan Moore, PhD LP  Patient ID: Louis [name] [facility])    MRN: [id number] Therapy format: Individual psychotherapy Date: 03/16/2021      Start: 3:12p     Stop: ***:***     Time Spent: *** min Location: In-person   Session narrative (presenting needs, interim history, self-report of stressors and symptoms, applications of prior therapy, status changes, and interventions made in session) Spring break, good week to do some relaxing things and   Got MCT oil yesterday, yet to use.  Plan remains to use it in evening as munchie prevention.  Met sleep doctor (Dohmeier), negotiating insruacne restrictons on what can be tested in what order.  Home study for apnea coming next.  Job interview yesterday, favorable, expecting a call coming from a superior with authority.  Cat doing better, minor coughing, off medicine.  No school anxiety to speak of past week.  Turned in the one assignment he has for break.  Has been having some more temptation to stay up this week, but that ties to seeing friends.  OK to skip trazodone if he would take it too late.  Nicotine gum has been helpful for stabilizing the habit and cutting costs.  Using gum 3/day, less than package directions, without cravings.    Will be seeing friends in [place] this evening.  Only concern to be avaiable for the phone call he expects.  Buoyed to find he is #6 out of about 30K players in a vintage speed game.  Discussed his gaming experience, team vs, invidual competition, distaste for bad sports and preferred lone wolf apporach.  Validated it as his place to pursue excellence, even if it's not well appreciated by mother (who has her own hx of nearly flunking out for playing video games of her day).  Notices he has become resilient to failures   Academically, got his zero resolved, assignment turned in.  97-98 on latest assignment, running a solid A right now in the most crucial class.   Closer to resolved that he will finish out at [employer], satisfied that he has good working relationships with teachers and feels the support system at school is working as intended.  Parent support feels stable.  Glad to be much more restrained with marijuana, and notices he's very clear next day why not to do much.   Did screenings for depression and anxiety -- still significant elevations.  Discussed ways of coping   Therapeutic modalities: {AM:23362::"Cognitive Behavioral Therapy","Solution-Oriented/Positive Psychology"}  Mental Status/Observations:  Appearance:   {PSY:22683}     Behavior:  {PSY:21022743}  Motor:  {PSY:22302}  Speech/Language:   {PSY:22685}  Affect:  {PSY:22687}  Mood:  {PSY:31886}  Thought process:  {PSY:31888}  Thought content:    {[phone]}  Sensory/Perceptual disturbances:    {PSY:6625771706}  Orientation:  {Psych Orientation:23301::"Fully oriented"}  Attention:  {Good-Fair-Poor ratings:23770::"Good"}    Concentration:  {Good-Fair-Poor ratings:23770::"Good"}  Memory:  {[phone]}  Insight:    {Good-Fair-Poor ratings:23770::"Good"}  Judgment:   {Good-Fair-Poor ratings:23770::"Good"}  Impulse Control:  {Good-Fair-Poor ratings:23770::"Good"}   Risk Assessment: Danger to Self: {Risk:22599::"No"} Self-injurious Behavior: {Risk:22599::"No"} Danger to Others: {Risk:22599::"No"} Physical Aggression / Violence: {Risk:22599::"No"} Duty to Warn: {AMYesNo:22526::"No"} Access to Firearms a concern: {AMYesNo:22526::"No"}  Assessment of progress:  {Progress:22147::"progressing"}  Diagnosis: No diagnosis found. Plan:  *** Consider outlook  Assignment to review [facility] material and ID 1-3 skills he'd like to brush back up. Other recommendations/advice as may be noted above Continue to utilize previously learned skills ad lib  Maintain medication as prescribed and work faithfully with relevant prescriber(s) if any changes are desired or seem indicated Call  the clinic on-call service, 988/hotline, 911, or present to [facility] or ER if any life-threatening psychiatric crisis Return for session(s) already scheduled. Already scheduled visit in this office 03/23/2021.  Blanchie Serve, PhD Luan Moore, PhD LP Clinical Psychologist, [facility] [address] [place], [place] [address] [phone]

## 2021-03-23 ENCOUNTER — Ambulatory Visit (INDEPENDENT_AMBULATORY_CARE_PROVIDER_SITE_OTHER): Payer: 59 | Admitting: Psychiatry

## 2021-03-23 ENCOUNTER — Other Ambulatory Visit: Payer: Self-pay

## 2021-03-23 DIAGNOSIS — F331 Major depressive disorder, recurrent, moderate: Secondary | ICD-10-CM

## 2021-03-23 DIAGNOSIS — F411 Generalized anxiety disorder: Secondary | ICD-10-CM | POA: Diagnosis not present

## 2021-03-23 DIAGNOSIS — F900 Attention-deficit hyperactivity disorder, predominantly inattentive type: Secondary | ICD-10-CM | POA: Diagnosis not present

## 2021-03-23 DIAGNOSIS — F1910 Other psychoactive substance abuse, uncomplicated: Secondary | ICD-10-CM | POA: Diagnosis not present
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
## 2021-03-28 ENCOUNTER — Ambulatory Visit (INDEPENDENT_AMBULATORY_CARE_PROVIDER_SITE_OTHER): Payer: 59 | Admitting: Neurology

## 2021-03-28 DIAGNOSIS — G4719 Other hypersomnia: Secondary | ICD-10-CM

## 2021-03-28 DIAGNOSIS — R0683 Snoring: Secondary | ICD-10-CM

## 2021-03-28 DIAGNOSIS — F411 Generalized anxiety disorder: Secondary | ICD-10-CM

## 2021-03-28 DIAGNOSIS — G4733 Obstructive sleep apnea (adult) (pediatric): Secondary | ICD-10-CM | POA: Diagnosis not present

## 2021-03-28 DIAGNOSIS — F901 Attention-deficit hyperactivity disorder, predominantly hyperactive type: Secondary | ICD-10-CM

## 2021-03-28 DIAGNOSIS — G4721 Circadian rhythm sleep disorder, delayed sleep phase type: Secondary | ICD-10-CM

## 2021-03-28 DIAGNOSIS — G4711 Idiopathic hypersomnia with long sleep time: Secondary | ICD-10-CM

## 2021-03-29 ENCOUNTER — Other Ambulatory Visit: Payer: Self-pay

## 2021-03-29 ENCOUNTER — Telehealth: Payer: Self-pay | Admitting: Adult Health

## 2021-03-29 DIAGNOSIS — F900 Attention-deficit hyperactivity disorder, predominantly inattentive type: Secondary | ICD-10-CM

## 2021-03-29 MED ORDER — METHYLPHENIDATE HCL ER (OSM) 36 MG PO TBCR: 36.0000 mg | EXTENDED_RELEASE_TABLET | Freq: Every day | ORAL | 0 refills | Status: DC

## 2021-03-29 NOTE — Telephone Encounter (Signed)
Spoke with patients mom Butch Penny concerning a Adderall refill. Jullien gave verbal permission for conversation. The Adderall is available at the [place] location as of today. Patient made an appointment for 4/17. ?

## 2021-03-29 NOTE — Telephone Encounter (Signed)
Called patient to verify medication and he needs RF of Concerta, not Adderall. Last RF 1/31.  ?

## 2021-03-30 ENCOUNTER — Ambulatory Visit (INDEPENDENT_AMBULATORY_CARE_PROVIDER_SITE_OTHER): Payer: 59 | Admitting: Psychiatry

## 2021-03-30 ENCOUNTER — Other Ambulatory Visit: Payer: Self-pay

## 2021-03-30 DIAGNOSIS — F4010 Social phobia, unspecified: Secondary | ICD-10-CM

## 2021-03-30 DIAGNOSIS — F1910 Other psychoactive substance abuse, uncomplicated: Secondary | ICD-10-CM | POA: Diagnosis not present

## 2021-03-30 DIAGNOSIS — F331 Major depressive disorder, recurrent, moderate: Secondary | ICD-10-CM | POA: Diagnosis not present

## 2021-03-30 DIAGNOSIS — F900 Attention-deficit hyperactivity disorder, predominantly inattentive type: Secondary | ICD-10-CM

## 2021-03-30 DIAGNOSIS — G4719 Other hypersomnia: Secondary | ICD-10-CM

## 2021-03-30 DIAGNOSIS — F411 Generalized anxiety disorder: Secondary | ICD-10-CM | POA: Diagnosis not present
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
## 2021-04-04 ENCOUNTER — Encounter: Payer: Self-pay | Admitting: Neurology

## 2021-04-04 DIAGNOSIS — G4721 Circadian rhythm sleep disorder, delayed sleep phase type: Secondary | ICD-10-CM | POA: Insufficient documentation
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
## 2021-04-04 NOTE — Progress Notes (Signed)
Excessive daytime sleepiness, sleep attacks, irresistible urge to go to sleep suddenly. He has to take breaks in the midst of road trips, no MVA thus far.?This has been a chronic issue, he reports that in low stimulation environment?it is hard to keep awake.?Has hx ADHD. Gets reportedly a minimum of 6 hours of sleep each night but craves to sleep longer, waking at 10-12 AM.? ?Started Remeron and gained 20 pounds. ? ?Here rule out organic sleep disorder.? ? ?IMPRESSION:  This HST confirms the presence of very mild apnea during a recording that encompassed only about 5.5  hours of sleep. Sleep latency was about 30 minutes and not excessive, but sleep onset was at 2.13 AM. ?Sleep was fragmented, see attached hypnogram, and arousals were not correlated to REM sleep, but seem to be more frequent in supine sleep position.  ?? ?RECOMMENDATION: given the high level of daytime sleepiness, (Epworth 16 points = excessive daytime sleepiness), I feel we should treat even this mild apnea and associated snoring . This can be done by CPAP or by a dental device. I also recommend to avoid supine sleep, using the tennis ball method or a Phillips sleep balance device. ? ?POD 1: Please inquire if the patient will try a CPAP auto device to help document if apnea plays a role in his sleepiness or not.  ??

## 2021-04-06 ENCOUNTER — Ambulatory Visit: Payer: 59 | Admitting: Psychiatry

## 2021-04-10 NOTE — Telephone Encounter (Signed)
Called the pt and reviewed the home sleep test results with him. Was able to review in detail that mild sleep apnea was present. Reviewed the recommendations made by Dr Vickey Huger. At this time he would like to consider the options. He will make sure that he is avoiding supine sleep during this time. He will call us if he wants to pursue CPAP. Pt verbalized understanding. ?Pt had no questions at this time but was encouraged to call back if questions arise. ? ?

## 2021-04-12 ENCOUNTER — Ambulatory Visit (INDEPENDENT_AMBULATORY_CARE_PROVIDER_SITE_OTHER): Payer: 59 | Admitting: Psychiatry

## 2021-04-12 DIAGNOSIS — F900 Attention-deficit hyperactivity disorder, predominantly inattentive type: Secondary | ICD-10-CM | POA: Diagnosis not present

## 2021-04-12 DIAGNOSIS — F411 Generalized anxiety disorder: Secondary | ICD-10-CM | POA: Diagnosis not present

## 2021-04-12 DIAGNOSIS — F1910 Other psychoactive substance abuse, uncomplicated: Secondary | ICD-10-CM | POA: Diagnosis not present

## 2021-04-12 DIAGNOSIS — G4711 Idiopathic hypersomnia with long sleep time: Secondary | ICD-10-CM

## 2021-04-12 DIAGNOSIS — F331 Major depressive disorder, recurrent, moderate: Secondary | ICD-10-CM | POA: Diagnosis not present

## 2021-04-12 NOTE — Progress Notes (Signed)
Psychotherapy Progress Note ?[facility] ?[name], PhD LP ? ?Patient ID: Louis [name] [facility])    MRN: [id number] ?Therapy format: Individual psychotherapy ?Date: 04/12/2021      Start: 3:15p     Stop: 4:00p     Time Spent: 45 min ?Location: In-person  ? ?Session narrative (presenting needs, interim history, self-report of stressors and symptoms, applications of prior therapy, status changes, and interventions made in session) ?Got the job at [employer], harder to figure time.  Bad couple of weeks, though, having overcommitted work hours.  Two cats have died now, too, both 19, had them since he was 6.  Misty died naturally last week, Junie put down today, both after kidney disease.  Support/empathy provided, validated that they were as sisters, encourage in any grieving that makes sense to him. ? ?Home sleep study did find moderate sleep apnea, recommended for CPAP.  Discussed likely options for treatment, relevance to his issues. ? ?Discussed body image.  Used to feel alarmingly overweight in h.s. at 175 lbs.  Now 220 and reasonably comfortable.  Hx brother 300+ lbs, attrib to drinking habit.  Last 2 nights has not eaten late, partly b/c mom has slept nearby, but also trying to prevent further gain ? ?This evening, some anxiety tied to having to balance work hours and homework load, also come home to deal further with absence of the cats. ? ?Therapeutic modalities: Cognitive Behavioral Therapy, Solution-Oriented/Positive Psychology, and Ego-Supportive ? ?Mental Status/Observations: ? ?Appearance:   Casual     ?Behavior:  Appropriate  ?Motor:  Normal  ?Speech/Language:   Clear and Coherent  ?Affect:  Appropriate and Constricted  ?Mood:  depressed  ?Thought process:  normal  ?Thought content:    WNL  ?Sensory/Perceptual disturbances:    WNL  ?Orientation:  Fully oriented  ?Attention:  Good  ?  ?Concentration:  Good  ?Memory:  WNL  ?Insight:    Good  ?Judgment:   Good  ?Impulse Control:  Fair   ? ?Risk Assessment: ?Danger to Self: No Self-injurious Behavior: No ?Danger to Others: No Physical Aggression / Violence: No ?Duty to Warn: No Access to Firearms a concern: No ? ?Assessment of progress:  progressing ? ?Diagnosis: ?  ICD-10-CM   ?1. Generalized anxiety disorder  F41.1   ?  ?2. Major depressive disorder, recurrent episode, moderate (HCC)  F33.1   ?  ?3. Attention deficit hyperactivity disorder (ADHD), predominantly inattentive type  F90.0   ?  ?4. Polysubstance abuse (HCC) - improved  F19.10   ?  ?5. 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 assessed with sleep apnea  ?  ? ?Plan:  ?Current ?[place] as moved ?Keep trying to limit night eating ?Trust self to balance time requirements ?Look into sleep apnea treatment as recommended ?Ongoing ?Try paced breathing (without smoke) ?Try to resist night eating, redirect to other activities first ?Continuing option to try MCT oil as a craving breaker ?Should seek more movement/exercise ?Cont working through academic return ?Cont nicotine weaning ?Cont restraint with pot use ?Other recommendations/advice as may be noted above ?Continue to utilize previously learned skills ad lib ?Maintain medication as prescribed and work faithfully with relevant prescriber(s) if any changes are desired or seem indicated ?Call the clinic on-call service, 988/hotline, 911, or present to [facility] or ER if any life-threatening psychiatric crisis ?Return for time as available. ?Already scheduled visit in this office 04/20/2021. ? ?Robley Fries, PhD ?[name], PhD LP ?Clinical Psychologist, [facility] ?  [facility] ?[address] ?[place], [place] [address] ?(o) [phone] ?

## 2021-04-13 NOTE — Telephone Encounter (Signed)
Pt has called back to inform that he would like to move forward with CPAP therapy, please call. ?

## 2021-04-16 ENCOUNTER — Encounter: Payer: Self-pay | Admitting: *Deleted

## 2021-04-16 ENCOUNTER — Telehealth: Payer: Self-pay

## 2021-04-16 ENCOUNTER — Other Ambulatory Visit: Payer: Self-pay | Admitting: *Deleted

## 2021-04-16 ENCOUNTER — Telehealth: Payer: Self-pay | Admitting: Neurology

## 2021-04-16 DIAGNOSIS — R0683 Snoring: Secondary | ICD-10-CM

## 2021-04-16 DIAGNOSIS — G4711 Idiopathic hypersomnia with long sleep time: Secondary | ICD-10-CM

## 2021-04-16 DIAGNOSIS — G4733 Obstructive sleep apnea (adult) (pediatric): Secondary | ICD-10-CM

## 2021-04-16 DIAGNOSIS — G4719 Other hypersomnia: Secondary | ICD-10-CM

## 2021-04-16 NOTE — Telephone Encounter (Signed)
Patient would like to try CPAP. ? ?Sleep study results from study on 03/28/2021: "Excessive daytime sleepiness, sleep attacks, irresistible urge to go to sleep suddenly. He has to take breaks in the midst of road trips, no MVA thus far. This has been a chronic issue, he reports that in low stimulation environment it is hard to keep awake. Has hx ADHD. Gets reportedly a minimum of 6 hours of sleep each night but craves to sleep longer, waking at 10-12 AM.  ?Started Remeron and gained 20 pounds.   ?Here rule out organic sleep disorder.  ?  ?IMPRESSION:  This HST confirms the presence of very mild apnea during a recording that encompassed only about 5.5  hours of sleep. Sleep latency was about 30 minutes and not excessive, but sleep onset was at 2.13 AM. ?Sleep was fragmented, see attached hypnogram, and arousals were not correlated to REM sleep, but seem to be more frequent in supine sleep position.  ?  ?RECOMMENDATION: given the high level of daytime sleepiness, (Epworth 16 points = excessive daytime sleepiness), I feel we should treat even this mild apnea and associated snoring . This can be done by CPAP or by a dental device. I also recommend to avoid supine sleep, using the tennis ball method or a Phillips sleep balance device. ?  ?POD 1: Please inquire if the patient will try a CPAP auto device to help document if apnea plays a role in his sleepiness or not." ?

## 2021-04-16 NOTE — Telephone Encounter (Signed)
Pt called inquiring about getting his cpap machine. Would like a call back from the nurse.  ?

## 2021-04-16 NOTE — Telephone Encounter (Signed)
Called and spoke w/ mother. Advised that since he has decided with CPAP therapy, MD will be placing order. We will then send order to [facility], phone [phone]. They will contact them in the next couple weeks once they get insurance auth to get him set up with CPAP. ? ?Advised [facility] will show the pt how to use the machine, fit for masks, and troubleshoot the CPAP if needed. A follow up appt was made for insurance purposes with Dr. Vickey Huger on 07/12/2021 at 1:30p. Mother verbalized understanding to arrive 15 minutes early and bring their CPAP. A letter with all of this information in it will be mailed to the pt as a reminder. I verified with the pt that the address we have on file is correct. Mother verbalized understanding of results. Had no questions at this time but was encouraged to call back if questions arise. I have sent the order to [facility] and have received confirmation that they have received the order. ? ?

## 2021-04-20 ENCOUNTER — Ambulatory Visit (INDEPENDENT_AMBULATORY_CARE_PROVIDER_SITE_OTHER): Payer: 59 | Admitting: Psychiatry

## 2021-04-20 DIAGNOSIS — G4711 Idiopathic hypersomnia with long sleep time: Secondary | ICD-10-CM | POA: Diagnosis not present

## 2021-04-20 DIAGNOSIS — F1910 Other psychoactive substance abuse, uncomplicated: Secondary | ICD-10-CM

## 2021-04-20 DIAGNOSIS — F331 Major depressive disorder, recurrent, moderate: Secondary | ICD-10-CM

## 2021-04-20 DIAGNOSIS — F900 Attention-deficit hyperactivity disorder, predominantly inattentive type: Secondary | ICD-10-CM | POA: Diagnosis not present

## 2021-04-20 DIAGNOSIS — F411 Generalized anxiety disorder: Secondary | ICD-10-CM

## 2021-04-20 NOTE — Progress Notes (Signed)
Psychotherapy Progress Note ?[facility] ?[name], PhD LP ? ?Patient ID: Louis [name] [facility])    MRN: [id number] ?Therapy format: Family therapy w/ patient -- accompanied by mother  [name] ?Date: 04/20/2021      Start: 4:20p     Stop: 5:20p     Time Spent: 60 min ?Location: In-person  ? ?Session narrative (presenting needs, interim history, self-report of stressors and symptoms, applications of prior therapy, status changes, and interventions made in session) ?Split session, as he is scheduled for work.  Brought mother for check in and comparing notes for his sake, OK to use remainder of session after he leaves. ? ?More anxious lately, trying to juggle new work schedule and school.  School achievement on target.  Trip planned for [place] with friends.  Still grieving [place] and [place], [place].   Junie's ashes coming this weekend.  [name] impressed and affirming how he treated them -- the consummate "brother" to them, including taking the one on a golf cart rise to the spots she used to like to roam to and no longer able.  Affirmed continuing right to grieve as he sees fit. ? ?Mother continues the session after Christpher left for work.  Confirmed how he has addressed getting back to school, worked through decision to stick with [employer] and graduate from there.  Especially affirmed asking help.  Learned more about hx of going to [facility] and how badly seeking help was stymied by the crowdedness of the program, limitations of TAs, experiences of several hours' wait in online queues to get questions answered, and pandemic-related limitations in learning and teaching, not to mention isolation.  So much better experience seeking help at [facility].  Affirmed seeing through decisions about which program and reconciling parents' differences about whether he should work while in school.  Clear it is adjustable, and clear it is [place]'s choice.  Affirmed also he is more talkative and forthcoming with mother compared to  last year, having detoxed, seen through changes, and worked out further living at home again.   ? ?Re. home responsibilities, [place] has been particularly helpful with cat care, though not so much with picking up, cleanliness.  Room can still be pretty messy.  Compulsive about carbs and still eating in the night.  Still rising late in the morning.  Most concerned at this point with health management.  Would like to take him shopping and get him more involved in meal preparation, but reveals he has a grocery store phobia, attached to the toxic girlfriend.  Encouraged in any incremental gains she can make bringing along more adult life skills, and in continuing to motivate most by asking Kimberly what results he wants and keeping praise low key and realistic. ? ?Therapeutic modalities: Cognitive Behavioral Therapy and Solution-Oriented/Positive Psychology ? ?Mental Status/Observations: ? ?Appearance:   Casual     ?Behavior:  Appropriate  ?Motor:  Normal  ?Speech/Language:   Clear and Coherent  ?Affect:  Appropriate and Constricted  ?Mood:  anxious and dysthymic  ?Thought process:  normal  ?Thought content:    WNL  ?Sensory/Perceptual disturbances:    WNL  ?Orientation:  Fully oriented  ?Attention:  Good  ?  ?Concentration:  Good  ?Memory:  WNL  ?Insight:    Fair  ?Judgment:   Good  ?Impulse Control:  Good  ? ?Risk Assessment: ?Danger to Self: No Self-injurious Behavior: No ?Danger to Others: No Physical Aggression / Violence: No ?Duty to Warn: No Access to Firearms a concern: No ? ?  Assessment of progress:  stabilized ? ?Diagnosis: ?  ICD-10-CM   ?1. Major depressive disorder, recurrent episode, moderate (HCC)  F33.1   ?  ?2. Generalized anxiety disorder  F41.1   ?  ?3. Attention deficit hyperactivity disorder (ADHD), predominantly inattentive type  F90.0   ?  ?4. 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 assessed as sleep apnea by neurology and sleep study  ?  ?5. Polysubstance abuse (HCC) - improved   F19.10   ?  ? ?Plan:  ?Current ?Keep trying to limit night eating ?Trust self to balance time requirements ?Look into sleep apnea treatment as recommended ?[place] as moved ?Ongoing ?Try paced breathing (without smoke) ?Try to resist night eating, redirect to other activities first ?Continuing option to try MCT oil as a craving breaker ?Should seek more movement/exercise ?Cont working through academic return ?Cont nicotine weaning ?Cont restraint with pot use ?Other recommendations/advice as may be noted above ?Continue to utilize previously learned skills ad lib ?Maintain medication as prescribed and work faithfully with relevant prescriber(s) if any changes are desired or seem indicated ?Call the clinic on-call service, 988/hotline, 911, or present to [facility] or ER if any life-threatening psychiatric crisis ?Return for session(s) already scheduled. ?Already scheduled visit in this office 04/23/2021. ? ?Robley Fries, PhD ?[name], PhD LP ?Clinical Psychologist, [facility] ?[facility] ?[address], [address] ?[place], [place] [address] ?(o) [phone] ?

## 2021-04-23 ENCOUNTER — Encounter: Payer: Self-pay | Admitting: Adult Health

## 2021-04-23 ENCOUNTER — Ambulatory Visit (INDEPENDENT_AMBULATORY_CARE_PROVIDER_SITE_OTHER): Payer: 59 | Admitting: Adult Health

## 2021-04-23 DIAGNOSIS — F411 Generalized anxiety disorder: Secondary | ICD-10-CM | POA: Diagnosis not present

## 2021-04-23 DIAGNOSIS — F900 Attention-deficit hyperactivity disorder, predominantly inattentive type: Secondary | ICD-10-CM

## 2021-04-23 DIAGNOSIS — F1910 Other psychoactive substance abuse, uncomplicated: Secondary | ICD-10-CM

## 2021-04-23 DIAGNOSIS — F331 Major depressive disorder, recurrent, moderate: Secondary | ICD-10-CM

## 2021-04-23 DIAGNOSIS — F99 Mental disorder, not otherwise specified: Secondary | ICD-10-CM

## 2021-04-23 DIAGNOSIS — F5105 Insomnia due to other mental disorder: Secondary | ICD-10-CM

## 2021-04-23 MED ORDER — TRAZODONE HCL 100 MG PO TABS: ORAL_TABLET | ORAL | 5 refills | Status: DC

## 2021-04-23 MED ORDER — METHYLPHENIDATE HCL ER (OSM) 36 MG PO TBCR: 36.0000 mg | EXTENDED_RELEASE_TABLET | Freq: Every day | ORAL | 0 refills | Status: DC

## 2021-04-23 NOTE — Progress Notes (Signed)
Louis [name] ?[id number] ?[birth year] ?26 y.o. ? ?Subjective:  ? ?Patient ID:  Louis [name] is a 26 y.o. (DOB 10-19-1995) male. ? ?Chief Complaint: No chief complaint on file. ? ? ?HPI ?Louis [name] presents to the office today for follow-up of MDD, ADHD, GAD, polysubstance abuse. ? ?Describes mood today as "ok". Pleasant. Flat. Mood symptoms - reports some depression, anxiety and irritability - trying to work a new job and manage school work. Decreased panic attacks - bouts of anxiety. Stating "I feel like I'm doing ok". Has decided not to return to [employer] and finish his degree at [employer]. Living at home with parents. Feels like medications are helpful. Improved interest and motivation. Taking medications as prescribed.  ?Energy levels lower. Active, planning to exercise more. ?Enjoys some usual interests and activities. Single. Not dating. Lives with parents and cat. Spending time with family and friends. ?Appetite adequate. Weight gain - 215 to 220 pounds. ?Sleeps well most nights. Averages 8 hours.  ?Focus and concentration stable. Completing tasks. Managing aspects of household. Taking 3 classes - [employer] - doing a lot better than previous semesters. Working a part time job - 22 hours a week. ?Denies SI or HI.  ?Denies AH or VH. ?Substance use - denies vaping - addicted to nicotine. ?History of THC - rarely ?History of ETOH use - every now and then -elevated LFT's. Following up with Dr. Beatris Ship at [facility]. ?Completed IOP program at [facility]. ? ?Previous medication trials: Prozac, Wellbutrin, Vyvanse, Adderall ? ? ? ? ?GAD-7   ? ?Flowsheet Row Counselor from 03/16/2021 in [place]  ?Total GAD-7 Score 14  ? ?  ? ?PHQ2-9   ? ?Flowsheet Row Counselor from 03/16/2021 in [place] from 05/03/2020 in [place]  ?PHQ-2 Total Score 5 5  ?PHQ-9 Total Score 21 20  ? ?  ?  ? ?Review of Systems:  ?Review of Systems  ?Musculoskeletal:  Negative  for gait problem.  ?Neurological:  Negative for tremors.  ?Psychiatric/Behavioral:    ?     Please refer to HPI  ? ?Medications: I have reviewed the patient's current medications. ? ?Current Outpatient Medications  ?Medication Sig Dispense Refill  ? escitalopram (LEXAPRO) 20 MG tablet Take 1 tablet (20 mg total) by mouth daily. 30 tablet 5  ? methylphenidate (CONCERTA) 36 MG PO CR tablet Take 1 tablet (36 mg total) by mouth daily. 30 tablet 0  ? traZODone (DESYREL) 100 MG tablet Take one tablet daily. 30 tablet 5  ? ?No current facility-administered medications for this visit.  ? ? ?Medication Side Effects: None ? ?Allergies: No Known Allergies ? ?Past Medical History:  ?Diagnosis Date  ? ADHD   ? Anxiety and depression   ? ? ?Past Medical History, Surgical history, Social history, and Family history were reviewed and updated as appropriate.  ? ?Please see review of systems for further details on the patient's review from today.  ? ?Objective:  ? ?Physical Exam:  ?There were no vitals taken for this visit. ? ?Physical Exam ?Constitutional:   ?   General: He is not in acute distress. ?Musculoskeletal:     ?   General: No deformity.  ?Neurological:  ?   Mental Status: He is alert and oriented to person, place, and time.  ?   Coordination: Coordination normal.  ?Psychiatric:     ?   Attention and Perception: Attention and perception normal. He does not perceive auditory or visual hallucinations.     ?  Mood and Affect: Mood normal. Mood is not anxious or depressed. Affect is not labile, blunt, angry or inappropriate.     ?   Speech: Speech normal.     ?   Behavior: Behavior normal.     ?   Thought Content: Thought content normal. Thought content is not paranoid or delusional. Thought content does not include homicidal or suicidal ideation. Thought content does not include homicidal or suicidal plan.     ?   Cognition and Memory: Cognition and memory normal.     ?   Judgment: Judgment normal.  ?   Comments: Insight  intact  ? ? ?Lab Review:  ?No results found for: NA, K, CL, CO2, GLUCOSE, BUN, CREATININE, CALCIUM, PROT, ALBUMIN, AST, ALT, ALKPHOS, BILITOT, GFRNONAA, GFRAA ? ?No results found for: WBC, RBC, HGB, HCT, PLT, MCV, MCH, MCHC, RDW, LYMPHSABS, MONOABS, EOSABS, BASOSABS ? ?No results found for: POCLITH, LITHIUM  ? ?No results found for: PHENYTOIN, PHENOBARB, VALPROATE, CBMZ  ? ?.res ?Assessment: Plan:   ? ? ?Plan: ? ?PDMP reviewed ? ?Continue Lexapro 20mg  daily  ?Trazadone 100mg  - one tab at hs ?Continue Concerta 36mg  daily ? ?BP - 109/69 ? ?Working with Dr Rica Mote ? ?Genesight testing results given for review. ? ?Has ceased substance use. ? ?RTC 4 weeks ? ?Patient advised to contact office with any questions, adverse effects, or acute worsening in signs and symptoms. ? ? ?Diagnoses and all orders for this visit: ? ?Major depressive disorder, recurrent episode, moderate ([place]) ? ?Insomnia due to other mental disorder ?-     traZODone (DESYREL) 100 MG tablet; Take one tablet daily. ? ?Attention deficit hyperactivity disorder (ADHD), predominantly inattentive type ?-     methylphenidate (CONCERTA) 36 MG PO CR tablet; Take 1 tablet (36 mg total) by mouth daily. ? ?Generalized anxiety disorder ? ?Polysubstance abuse ([place]) - improved ? ?  ? ?Please see After Visit Summary for patient specific instructions. ? ?Future Appointments  ?Date Time Provider [place]  ?05/04/2021  4:00 PM Mitchum, Herbie Baltimore, PhD CP-CP None  ?05/11/2021  4:00 PM Mitchum, Herbie Baltimore, PhD CP-CP None  ?05/18/2021  4:00 PM Mitchum, Herbie Baltimore, PhD CP-CP None  ?05/28/2021 11:00 AM Blanchie Serve, PhD CP-CP None  ?06/08/2021  9:00 AM Blanchie Serve, PhD CP-CP None  ?07/12/2021  1:30 PM Dohmeier, Asencion Partridge, MD GNA-GNA None  ?07/23/2021 10:00 AM Earma Nicolaou, Berdie Ogren, NP CP-CP None  ? ? ?No orders of the defined types were placed in this encounter. ? ? ?------------------------------- ?

## 2021-05-04 ENCOUNTER — Ambulatory Visit (INDEPENDENT_AMBULATORY_CARE_PROVIDER_SITE_OTHER): Payer: 59 | Admitting: Psychiatry

## 2021-05-04 DIAGNOSIS — F331 Major depressive disorder, recurrent, moderate: Secondary | ICD-10-CM

## 2021-05-04 DIAGNOSIS — F411 Generalized anxiety disorder: Secondary | ICD-10-CM | POA: Diagnosis not present

## 2021-05-04 DIAGNOSIS — F900 Attention-deficit hyperactivity disorder, predominantly inattentive type: Secondary | ICD-10-CM | POA: Diagnosis not present

## 2021-05-04 DIAGNOSIS — G4730 Sleep apnea, unspecified: Secondary | ICD-10-CM

## 2021-05-04 NOTE — Progress Notes (Signed)
Psychotherapy Progress Note [facility] Luan Moore, PhD LP  Patient ID: Louis [name] [facility])    MRN: [id number] Therapy format: Individual psychotherapy Date: 05/04/2021      Start: 4:23p     Stop: 4:53p     Time Spent: 30 min Location: Telehealth visit -- I connected with this patient by an approved telecommunication method (video), with his informed consent, and verifying identity and patient privacy.  I was located at my office and patient at his home.  As needed, we discussed the limitations, risks, and security and privacy concerns associated with telehealth service, including the availability and conditions which currently govern in-person appointments and the possibility that 3rd-party payment may not be fully guaranteed and he may be responsible for charges.  After he indicated understanding, we proceeded with the session.  Also discussed treatment planning, as needed, including ongoing verbal agreement with the plan, the opportunity to ask and answer all questions, his demonstrated understanding of instructions, and his readiness to call the office should symptoms worsen or he feels he is in a crisis state and needs more immediate and tangible assistance.   Session narrative (presenting needs, interim history, self-report of stressors and symptoms, applications of prior therapy, status changes, and interventions made in session) Sick last night, something viral.  Continue to work regular schedule.  Late semester cluster of school assignments and final exams.  Had one down grade, not catastrophic.  Did negotiate fewer hours at [employer] (25 to 20), but staying later, so effectively 25 still.  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  Confirmed he has interests, knows the deadline.  Confesses he is worried about calling off work, feels he could be judged for calling off, leaving them hanging.  Hard to resolve the worry, but assured  that it is legitimate to be sick, respectful not to go to work contagious (esp. food service), and good practice finding out kindness in the world if he does call in.  Allowed him to check parents' judgment, father said to go in anyway.  Resolved to report for duty, inform management of contagious illness.    Grief settling some for the cats.  Began CPAP therapy, not having problems adjusting to it.  Therapeutic modalities: Cognitive Behavioral Therapy and Solution-Oriented/Positive Psychology  Mental Status/Observations:  Appearance:   Casual     Behavior:  Appropriate  Motor:  Normal  Speech/Language:   Clear and Coherent  Affect:  Constricted  Mood:  dysthymic  Thought process:  normal  Thought content:    WNL  Sensory/Perceptual disturbances:    WNL  Orientation:  Fully oriented  Attention:  Good    Concentration:  Good  Memory:  WNL  Insight:    Good  Judgment:   Good  Impulse Control:  Good   Risk Assessment: Danger to Self: No Self-injurious Behavior: No Danger to Others: No Physical Aggression / Violence: No Duty to Warn: No Access to Firearms a concern: No  Assessment of progress:  progressing  Diagnosis:   ICD-10-CM   1. Major depressive disorder, recurrent episode, moderate (HCC)  F33.1     2. Generalized anxiety disorder  F41.1     3. Attention deficit hyperactivity disorder (ADHD), predominantly inattentive type  F90.0     4. Sleep apnea treated with continuous positive airway pressure (CPAP)  G47.30      Plan:  Current Report to work as decided, inform management of illness and check need  for precautions or release form work (vs. obsessing privately) Continue use of CPAP, self-monitor for benefit Ongoing Keep trying to limit night eating Trust self to balance time requirements Mallory Shirk as moved Try paced breathing (without smoke) for relaxation and  Try to resist night eating, redirect to other activities first Continuing option to try MCT oil as a  craving breaker Should seek more movement/exercise Cont working through academic return Cont nicotine weaning Abstain from pot Other recommendations/advice as may be noted above Continue to utilize previously learned skills ad lib Maintain medication as prescribed and work faithfully with relevant prescriber(s) if any changes are desired or seem indicated Call the clinic on-call service, 988/hotline, 911, or present to [facility] or ER if any life-threatening psychiatric crisis Return for session(s) already scheduled. Already scheduled visit in this office 05/11/2021.  Blanchie Serve, PhD Luan Moore, PhD LP Clinical Psychologist, [facility] [address], [place], [place] [address] [phone]

## 2021-05-11 ENCOUNTER — Telehealth (INDEPENDENT_AMBULATORY_CARE_PROVIDER_SITE_OTHER): Payer: 59 | Admitting: Psychiatry

## 2021-05-11 DIAGNOSIS — F411 Generalized anxiety disorder: Secondary | ICD-10-CM

## 2021-05-11 DIAGNOSIS — F331 Major depressive disorder, recurrent, moderate: Secondary | ICD-10-CM

## 2021-05-11 DIAGNOSIS — F99 Mental disorder, not otherwise specified: Secondary | ICD-10-CM

## 2021-05-11 DIAGNOSIS — F5105 Insomnia due to other mental disorder: Secondary | ICD-10-CM

## 2021-05-11 DIAGNOSIS — F900 Attention-deficit hyperactivity disorder, predominantly inattentive type: Secondary | ICD-10-CM | POA: Diagnosis not present

## 2021-05-11 DIAGNOSIS — G4730 Sleep apnea, unspecified: Secondary | ICD-10-CM | POA: Diagnosis not present

## 2021-05-11 DIAGNOSIS — F1910 Other psychoactive substance abuse, uncomplicated: Secondary | ICD-10-CM

## 2021-05-11 NOTE — Progress Notes (Signed)
Psychotherapy Progress Note [facility] [name], PhD LP  Patient ID: Louis [name] [facility])    MRN: [id number] Therapy format: Individual psychotherapy Date: 05/11/2021      Start: 4:10p     Stop: 4:41p     Time Spent: 31 min Location: Telehealth visit -- I connected with this patient by an approved telecommunication method (video), with his informed consent, and verifying identity and patient privacy.  I was located at my office and patient at his home.  As needed, we discussed the limitations, risks, and security and privacy concerns associated with telehealth service, including the availability and conditions which currently govern in-person appointments and the possibility that 3rd-party payment may not be fully guaranteed and he may be responsible for charges.  After he indicated understanding, we proceeded with the session.  Also discussed treatment planning, as needed, including ongoing verbal agreement with the plan, the opportunity to ask and answer all questions, his demonstrated understanding of instructions, and his readiness to call the office should symptoms worsen or he feels he is in a crisis state and needs more immediate and tangible assistance.   Session narrative (presenting needs, interim history, self-report of stressors and symptoms, applications of prior therapy, status changes, and interventions made in session) Still congested a week later, did go to work anyway last Friday, wound up calling off Saturday, did mask and inform boss.  Still scheduled tightly today, with work right after session.  Semester finished, knows he has 1 A in the bag, results of 2 classes await.  Anxiety about summer session starting next Wed, incl. Calculus II, been some time since Calc I.    Sleep treatment working OK, no complaints.  Occasionally falling asleep before putting it on.  Noticing the difference in energy if he does or doesn't use CPAP.  Does feel intrusive daytime  sleepiness is better than it was.  More reliably getting in the bed earlier, somewhere between 12 and 1am, up between 7a and 12:30p.  Nutrition varies, but maybe less night eating.  Still recommend early protein, improving vegetable intake.  Night eating has become less pushy since adopting CPAP.    Does note anxiety before work shifts, lets up when he gets there.  Notices he can have 2.5 hrs of worry, actually, feeling like he has to prepare himself, but it may only really take 15 min to be ready.  Framed exercise in recognizing and ending ambivalence and practicing decisiveness to either go ahead and get ready now, get it over with, or decidedly wait until a target time and resists thinking about it.  Added idea that if he is paralyzed choosing which way to go with any decision, he can resort to flipping a coin and just do what it says; if he can't tolerate what it says, then he knows which way he feels stronger.  Therapeutic modalities: Cognitive Behavioral Therapy, Solution-Oriented/Positive Psychology, and Ego-Supportive  Mental Status/Observations:  Appearance:   Casual     Behavior:  Appropriate  Motor:  Normal  Speech/Language:   Clear and Coherent  Affect:  Appropriate  Mood:  anxious  Thought process:  normal  Thought content:    WNL  Sensory/Perceptual disturbances:    WNL  Orientation:  Fully oriented  Attention:  Good    Concentration:  Good  Memory:  WNL  Insight:    Good  Judgment:   Good  Impulse Control:  Fair   Risk Assessment: Danger to Self: No Self-injurious Behavior: No  Danger to Others: No Physical Aggression / Violence: No Duty to Warn: No Access to Firearms a concern: No  Assessment of progress:  progressing  Diagnosis:   ICD-10-CM   1. Generalized anxiety disorder  F41.1     2. Attention deficit hyperactivity disorder (ADHD), predominantly inattentive type  F90.0     3. Major depressive disorder, recurrent episode, moderate (HCC)  F33.1     4. Sleep  apnea treated with continuous positive airway pressure (CPAP)  G47.30     5. Insomnia due to other mental disorder  F51.05    F99     6. Polysubstance abuse (HCC) - improved  F19.10      Plan:  Sleep hygiene -- reasonable bedtime, reliable CPAP, food and light curfew Keep trying to limit night eating.  Continuing option to try MCT oil as a craving breaker Try paced breathing (without smoke) for relaxation and stress relief Should seek more movement/exercise Cont nicotine weaning, abstain from pot Cont working through academic reentry, trust self to balance time commitments Notice times of indecisiveness and self-harassing worry, try to come off the fence quickly.  If paralyzed, use a coin flip and obey it. Continue open, assertive communication with parents, including asking for changes if they raise anxiety without a clear benefit.  Seek to frame any suggestions or requests as voluntary for good reason, not demands. Other recommendations/advice as may be noted above Continue to utilize previously learned skills ad lib Maintain medication as prescribed and work faithfully with relevant prescriber(s) if any changes are desired or seem indicated Call the clinic on-call service, 988/hotline, 911, or present to [facility] or ER if any life-threatening psychiatric crisis Return for session(s) already scheduled. Already scheduled visit in this office 05/18/2021.  Robley Fries, PhD [name], PhD LP Clinical Psychologist, [facility] [address] [place], [place] [address] [phone]

## 2021-05-18 ENCOUNTER — Telehealth (INDEPENDENT_AMBULATORY_CARE_PROVIDER_SITE_OTHER): Payer: 59 | Admitting: Psychiatry

## 2021-05-18 DIAGNOSIS — F4010 Social phobia, unspecified: Secondary | ICD-10-CM

## 2021-05-18 DIAGNOSIS — F411 Generalized anxiety disorder: Secondary | ICD-10-CM

## 2021-05-18 DIAGNOSIS — F5105 Insomnia due to other mental disorder: Secondary | ICD-10-CM | POA: Diagnosis not present

## 2021-05-18 DIAGNOSIS — F331 Major depressive disorder, recurrent, moderate: Secondary | ICD-10-CM | POA: Diagnosis not present

## 2021-05-18 DIAGNOSIS — F900 Attention-deficit hyperactivity disorder, predominantly inattentive type: Secondary | ICD-10-CM

## 2021-05-18 DIAGNOSIS — G4730 Sleep apnea, unspecified: Secondary | ICD-10-CM

## 2021-05-18 DIAGNOSIS — F1910 Other psychoactive substance abuse, uncomplicated: Secondary | ICD-10-CM

## 2021-05-18 DIAGNOSIS — F99 Mental disorder, not otherwise specified: Secondary | ICD-10-CM

## 2021-05-18 NOTE — Progress Notes (Signed)
Psychotherapy Progress Note [facility] [name], PhD LP  Patient ID: Louis [name] [facility])    MRN: [id number] Therapy format: Individual psychotherapy Date: 05/18/2021      Start: 4:16p     Stop: 4:41p     Time Spent: 25 min Location: Telehealth visit -- I connected with this patient by an approved telecommunication method (video), with his informed consent, and verifying identity and patient privacy.  I was located at my office and patient at his home.  As needed, we discussed the limitations, risks, and security and privacy concerns associated with telehealth service, including the availability and conditions which currently govern in-person appointments and the possibility that 3rd-party payment may not be fully guaranteed and he may be responsible for charges.  After he indicated understanding, we proceeded with the session.  Also discussed treatment planning, as needed, including ongoing verbal agreement with the plan, the opportunity to ask and answer all questions, his demonstrated understanding of instructions, and his readiness to call the office should symptoms worsen or he feels he is in a crisis state and needs more immediate and tangible assistance.   Session narrative (presenting needs, interim history, self-report of stressors and symptoms, applications of prior therapy, status changes, and interventions made in session) Finished first semester with 1 B and 2 As.  Affirmed the accomplishment.  One class planned each of the two summer sessions, somewhat intimidated about Calculus II second term.  Recommended to consult [facility] (already begun), or [place] Courses, or his Calc I notes for help warming up the subject.  Some fear he will procrastinate the work when he does get into it, but willing to cross that bridge when it comes.  Current course is about hiphop music and cultural impact, interesting enough in its own right.  Head cold cleared up.  Trouble  getting to bed early enough lately, while more in free time mode, but realizes it's shorting himself because he'll sleep late.  Enjoyed learning a new video game speed run.  Recommend resume amber glasses if gaming or viewing before bed.  Still comfortable with CPAP, has more vivid dreams when he uses it, but not upsetting.  Reinforced the notion that it's catching up on REM sleep deficit, and good news for his concentration and mood.  Night eating peaked out, still can now and then.  Doing other things of interest has helped.  Taking stimulant less lately while not having to concentrate on schoolwork.  Resolved to either do something non-swallowing or go on to bed to prevent night eating  Cat stable, accepting breathing treatments BID.    Therapeutic modalities: Cognitive Behavioral Therapy, Solution-Oriented/Positive Psychology, and Ego-Supportive  Mental Status/Observations:  Appearance:   Casual     Behavior:  Appropriate  Motor:  Normal  Speech/Language:   Clear and Coherent  Affect:  Appropriate  Mood:  anxious and dysthymic  Thought process:  normal  Thought content:    WNL  Sensory/Perceptual disturbances:    WNL  Orientation:  Fully oriented  Attention:  Good    Concentration:  Good  Memory:  WNL  Insight:    Fair  Judgment:   Good  Impulse Control:  Good   Risk Assessment: Danger to Self: No Self-injurious Behavior: No Danger to Others: No Physical Aggression / Violence: No Duty to Warn: No Access to Firearms a concern: No  Assessment of progress:  progressing  Diagnosis:   ICD-10-CM   1. Generalized anxiety disorder  F41.1  2. Attention deficit hyperactivity disorder (ADHD), predominantly inattentive type  F90.0     3. Insomnia due to other mental disorder  F51.05    F99     4. Major depressive disorder, recurrent episode, moderate (HCC)  F33.1      Plan:  Sleep hygiene -- reasonable bedtime, reliable CPAP, food and light curfew, resume using amber lenses  esp. for pre-sleep video screen exposure Keep trying to limit night eating.  Redirect to non-feeding activities, trust mild hunger to help with good sleep.  Continuing option to try MCT oil as a craving breaker. Try paced breathing (without smoke) for relaxation and stress relief Seek more movement/exercise Cont nicotine weaning, abstain from pot Cont working through academic reentry.  Trust self to balance time commitments, consult auxiliary tutoring resources ad lib. Notice times of indecisiveness and self-harassing worry, try to come off the fence quickly.  If paralyzed, use a coin flip and obey it. Continue open, assertive communication with parents, including asking for changes if they raise anxiety without a clear benefit.  Seek to frame any suggestions or requests as voluntary for good reason, not demands. Other recommendations/advice as may be noted above Continue to utilize previously learned skills ad lib Maintain medication as prescribed and work faithfully with relevant prescriber(s) if any changes are desired or seem indicated Call the clinic on-call service, 988/hotline, 911, or present to [facility] or ER if any life-threatening psychiatric crisis Return for session(s) already scheduled. Already scheduled visit in this office 06/08/2021.  Blanchie Serve, PhD Luan Moore, PhD LP Clinical Psychologist, [facility] [address] [place], [place] [address] [phone]

## 2021-05-28 ENCOUNTER — Ambulatory Visit: Payer: 59 | Admitting: Psychiatry

## 2021-06-08 ENCOUNTER — Ambulatory Visit (INDEPENDENT_AMBULATORY_CARE_PROVIDER_SITE_OTHER): Payer: 59 | Admitting: Psychiatry

## 2021-06-08 DIAGNOSIS — F331 Major depressive disorder, recurrent, moderate: Secondary | ICD-10-CM | POA: Diagnosis not present

## 2021-06-08 DIAGNOSIS — F411 Generalized anxiety disorder: Secondary | ICD-10-CM

## 2021-06-08 DIAGNOSIS — G4721 Circadian rhythm sleep disorder, delayed sleep phase type: Secondary | ICD-10-CM

## 2021-06-08 DIAGNOSIS — F900 Attention-deficit hyperactivity disorder, predominantly inattentive type: Secondary | ICD-10-CM | POA: Diagnosis not present

## 2021-06-08 NOTE — Progress Notes (Signed)
Psychotherapy Progress Note [facility] [name], PhD LP  Patient ID: Louis [name] [facility])    MRN: [id number] Therapy format: Individual psychotherapy Date: 06/08/2021      Start: 9:18a     Stop: 10:04a     Time Spent: 46 min Location: In-person   Session narrative (presenting needs, interim history, self-report of stressors and symptoms, applications of prior therapy, status changes, and interventions made in session) Feeling more stagnant -- summer school, one class, not so demanding.  Calc II looming for second session.  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  Family trip to [place].  Sleep stabilizing more at a wake up time of 9 or 9:15a.  Bedtime variable, often enough to short sleep, reasonable assumption mood keys off sleep shortages.  Better about night eating, trip to [place] broke the cycle.  Options for tempting foods have diminished as mother has heeded an expressed wish to bring less junk food in.  Knows video/computer interests can stretch his window to sleep, even though he is using amber lenses.    Discussed gaming interests, has one he likes but rarely has time for.  Takes about 75 minutes to run, and parents tend to say good night then come back in, or ask his help with something abruptly at a time he intends to get into it.  Validated that his gaming is a hobby, actually -- up to a point at least -- and OK to ask for dedicated, interruption-free time, but he'll need to verbalize it.  Apprehensive but agrees.  Therapeutic modalities: Cognitive Behavioral Therapy and Solution-Oriented/Positive Psychology  Mental Status/Observations:  Appearance:   Casual     Behavior:  Appropriate  Motor:  Normal  Speech/Language:   Clear and Coherent  Affect:  Appropriate  Mood:  Less anxious  Thought process:  normal  Thought content:    WNL  Sensory/Perceptual disturbances:    WNL  Orientation:   Fully oriented  Attention:  Good    Concentration:  Good  Memory:  WNL  Insight:    Good  Judgment:   Good  Impulse Control:  Good   Risk Assessment: Danger to Self: No Self-injurious Behavior: No Danger to Others: No Physical Aggression / Violence: No Duty to Warn: No Access to Firearms a concern: No  Assessment of progress:  progressing  Diagnosis:   ICD-10-CM   1. Generalized anxiety disorder  F41.1     2. Major depressive disorder, recurrent episode, moderate (HCC)  F33.1     3. Attention deficit hyperactivity disorder (ADHD), predominantly inattentive type  F90.0     4. 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    improved recently     Plan:  Food ideas -- practice wasting one bite, maintain resistance to night eating gained with travel, encourage M as needed to restrain junk food entering the house or sequester it somehow Try to schedule energizing gaming earlier  Continue using amber lenses evening Maintain reliable wake time  OK to advocate with parents for uninterruptible hobby time Other recommendations/advice as may be noted above Continue to utilize previously learned skills ad lib Maintain medication as prescribed and work faithfully with relevant prescriber(s) if any changes are desired or seem indicated Call the clinic on-call service, 988/hotline, 911, or present to [facility] or ER if any life-threatening psychiatric crisis Return for session(s) already scheduled. Already scheduled visit in this office 06/22/2021.  Blanchie Serve, PhD Luan Moore, PhD LP Clinical Psychologist, [facility] [address], [place], [place] [address] [phone]

## 2021-06-22 ENCOUNTER — Ambulatory Visit (INDEPENDENT_AMBULATORY_CARE_PROVIDER_SITE_OTHER): Payer: 59 | Admitting: Psychiatry

## 2021-06-22 DIAGNOSIS — G4721 Circadian rhythm sleep disorder, delayed sleep phase type: Secondary | ICD-10-CM

## 2021-06-22 DIAGNOSIS — F411 Generalized anxiety disorder: Secondary | ICD-10-CM | POA: Diagnosis not present

## 2021-06-22 DIAGNOSIS — F331 Major depressive disorder, recurrent, moderate: Secondary | ICD-10-CM | POA: Diagnosis not present

## 2021-06-22 DIAGNOSIS — F900 Attention-deficit hyperactivity disorder, predominantly inattentive type: Secondary | ICD-10-CM | POA: Diagnosis not present

## 2021-06-22 NOTE — Progress Notes (Signed)
Psychotherapy Progress Note [facility] Louis Czar, PhD LP  Patient ID: Louis [name] [facility])    MRN: [id number] Therapy format: Individual psychotherapy Date: 06/22/2021      Start: 3:15p     Stop: 4:00p     Time Spent: 45 min Location: In-person   Session narrative (presenting needs, interim history, self-report of stressors and symptoms, applications of prior therapy, status changes, and interventions made in session) Has tried wasting one bite when it's night eating.  Not particular effort to reschedule gaming.  Not clear enough with parents about wanting some uninterruptible time anxious he would offend.  Offered approach comparing mom's tennis or father's projects as things they might want uninterrupted, his can be the same thing -- a passion, wants uninterrupted time in.  Validated gaming interests as long as he can tell if it's infringing on real responsibilities or crossing into addiction.  Switched 2nd session summer class from Calc II to a gen ed class.  Calc II now set up for spring 24, and fresher Calc I this fall.  Plan to meet with advisor early fall.    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  Good sense of being capable.  Affirmed and encouraged.  Re. ADHD -- or maybe anxiety -- has the experience of sometimes having too many choices and feeling like all of them are wrong.  Reframed as anxiety disorder imposing distress, when he actually can just go with whichever he can engage sooner.    Was off Concerta a bit, now back on as the new term approaches.  Therapeutic modalities: Cognitive Behavioral Therapy and Solution-Oriented/Positive Psychology  Mental Status/Observations:  Appearance:   Casual     Behavior:  Appropriate  Motor:  Normal  Speech/Language:   Clear and Coherent  Affect:  Constricted  Mood:  anxious  Thought process:   normal  Thought content:    WNL  Sensory/Perceptual disturbances:    WNL  Orientation:  Fully oriented  Attention:  Good    Concentration:  Good  Memory:  WNL  Insight:    Good  Judgment:   Good  Impulse Control:  Fair   Risk Assessment: Danger to Self: No Self-injurious Behavior: No Danger to Others: No Physical Aggression / Violence: No Duty to Warn: No Access to Firearms a concern: No  Assessment of progress:  stabilized  Diagnosis:   ICD-10-CM   1. Generalized anxiety disorder  F41.1     2. Major depressive disorder, recurrent episode, moderate (HCC)  F33.1     3. Attention deficit hyperactivity disorder (ADHD), predominantly inattentive type  F90.0     4. 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      Plan:  Try further leaving a bite, choosing away from junk food, and still apply food curfew Worth rescheduling gaming Try again asking parents for uninterrupted time Practice going ahead with any constructive choice when feeling dilemmas Other recommendations/advice as may be noted above Continue to utilize previously learned skills ad lib Maintain medication as prescribed and work faithfully with relevant prescriber(s) if any changes are desired or seem indicated Call the clinic on-call service, 988/hotline, 911, or present to [facility] or ER if any life-threatening psychiatric crisis Return for session(s) already scheduled. Already scheduled visit in this office 06/29/2021.  Robley Fries, PhD Louis Czar, PhD LP Clinical Psychologist, [facility]  [facility] [address], [place], [place] [address] [phone]

## 2021-06-29 ENCOUNTER — Ambulatory Visit (INDEPENDENT_AMBULATORY_CARE_PROVIDER_SITE_OTHER): Payer: 59 | Admitting: Psychiatry

## 2021-06-29 DIAGNOSIS — F411 Generalized anxiety disorder: Secondary | ICD-10-CM | POA: Diagnosis not present

## 2021-06-29 DIAGNOSIS — F331 Major depressive disorder, recurrent, moderate: Secondary | ICD-10-CM | POA: Diagnosis not present

## 2021-06-29 DIAGNOSIS — G4730 Sleep apnea, unspecified: Secondary | ICD-10-CM

## 2021-06-29 DIAGNOSIS — G4721 Circadian rhythm sleep disorder, delayed sleep phase type: Secondary | ICD-10-CM | POA: Diagnosis not present

## 2021-06-29 DIAGNOSIS — F900 Attention-deficit hyperactivity disorder, predominantly inattentive type: Secondary | ICD-10-CM

## 2021-06-29 NOTE — Progress Notes (Signed)
Psychotherapy Progress Note [facility] [name], PhD LP  Patient ID: Louis [name] [facility])    MRN: [id number] Therapy format: Individual psychotherapy Date: 06/29/2021      Start: 2:04p     Stop: 2:52p     Time Spent: 48 min Location: Telehealth visit -- I connected with this patient by an approved telecommunication method (video), with his informed consent, and verifying identity and patient privacy.  I was located at my office and patient at his home.  As needed, we discussed the limitations, risks, and security and privacy concerns associated with telehealth service, including the availability and conditions which currently govern in-person appointments and the possibility that 3rd-party payment may not be fully guaranteed and he may be responsible for charges.  After he indicated understanding, we proceeded with the session.  Also discussed treatment planning, as needed, including ongoing verbal agreement with the plan, the opportunity to ask and answer all questions, his demonstrated understanding of instructions, and his readiness to call the office should symptoms worsen or he feels he is in a crisis state and needs more immediate and tangible assistance.   Session narrative (presenting needs, interim history, self-report of stressors and symptoms, applications of prior therapy, status changes, and interventions made in session) Changed to telehealth short notice.  Reports sleep trouble lately, staying in bed till 10, and relapsing in staying up nights.  Knows what he needs to do.  Both worried and excited about parents taking a trip tomorrow for 5 or 6 days.  (Last time felt overstressed, worried he would feel overwhelmed and not know what to do, particularly about providing for his own meals without resorting to fast food.  Encouraged to make a schedule/menu of meals he can make for himself and which he can go out for.  Not worried about keeping up with school work, but  finding himself resisting getting out to [employer] job, dreading the time commitment and boredom.  The paycheck really doesn't mean that much to him, so refreshed on his more intrinsic motivations -- working experience and breaking the monotony of his room.  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 -- and still the possibility of asking to learn other stations.  At home, cat care responsibilities are now routine compared to before, not intimidating.  Has social opportunities enough and an ad lib enough to keep from getting lonely.    One issue with new manager, promoted from kitchen staff, who gets confusing, e.g., reminding him he should check out with the manager before leaving, when he typically does that, and instructing him to do other things already done, seemingly just to justify her authority.  Also showing a habit of overattention to things that need doing and negligible attention to the fact that they got done.  Briefly discussed responses if he needs to challenge her judgment or invite her to see better.  Does c/o brain fog.  CPAP typically used, not quite all the time but does notice if he misses.  Figures more that circadian rhythm is wobbling.  Did get out for some skateboarding earlier, first in a while.  For today, just recommend good lighting today and maybe another round of physical activity, to help reset circadian timing.    Therapeutic modalities: Cognitive Behavioral Therapy and Solution-Oriented/Positive Psychology  Mental  Status/Observations:  Appearance:   Casual     Behavior:  Appropriate  Motor:  Normal  Speech/Language:   Clear and Coherent  Affect:  Constricted  Mood:  anxious  Thought process:  normal  Thought content:    WNL  Sensory/Perceptual disturbances:    WNL  Orientation:  Fully oriented  Attention:   Good    Concentration:  Good  Memory:  WNL  Insight:    Good  Judgment:   Good  Impulse Control:  Fair   Risk Assessment: Danger to Self: No Self-injurious Behavior: No Danger to Others: No Physical Aggression / Violence: No Duty to Warn: No Access to Firearms a concern: No  Assessment of progress:  stabilized  Diagnosis:   ICD-10-CM   1. Generalized anxiety disorder  F41.1     2. 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 Major depressive disorder, recurrent episode, moderate (HCC)  F33.1     4. Attention deficit hyperactivity disorder (ADHD), predominantly inattentive type  F90.0     5. Sleep apnea treated with continuous positive airway pressure (CPAP)  G47.30      Plan:  Current For circadian rhythm and mental "drag" -- resume sleep scheduling, increase daylight exposure by day, ensure sufficient contrast with evening light, curfew eating at night, and stay regular with CPAP For worry over food prep, make list of available meals For boredom at work -- create personal work games For social needs this week -- program/schedule contacts as needed Other recommendations/advice as may be noted above Continue to utilize previously learned skills ad lib Maintain medication as prescribed and work faithfully with relevant prescriber(s) if any changes are desired or seem indicated Call the clinic on-call service, 988/hotline, 911, or present to [facility] or ER if any life-threatening psychiatric crisis Return for session(s) already scheduled. Already scheduled visit in this office 07/06/2021.  Robley Fries, PhD [name], PhD LP Clinical Psychologist, [facility] [address] [place], [place] [address] [phone]

## 2021-07-06 ENCOUNTER — Ambulatory Visit: Payer: 59 | Admitting: Psychiatry

## 2021-07-10 ENCOUNTER — Encounter: Payer: Self-pay | Admitting: Neurology

## 2021-07-11 ENCOUNTER — Ambulatory Visit (INDEPENDENT_AMBULATORY_CARE_PROVIDER_SITE_OTHER): Payer: 59 | Admitting: Psychiatry

## 2021-07-11 DIAGNOSIS — F99 Mental disorder, not otherwise specified: Secondary | ICD-10-CM

## 2021-07-11 DIAGNOSIS — F5105 Insomnia due to other mental disorder: Secondary | ICD-10-CM | POA: Diagnosis not present

## 2021-07-11 DIAGNOSIS — F900 Attention-deficit hyperactivity disorder, predominantly inattentive type: Secondary | ICD-10-CM

## 2021-07-11 DIAGNOSIS — F411 Generalized anxiety disorder: Secondary | ICD-10-CM

## 2021-07-11 DIAGNOSIS — F4010 Social phobia, unspecified: Secondary | ICD-10-CM | POA: Diagnosis not present

## 2021-07-11 DIAGNOSIS — G4730 Sleep apnea, unspecified: Secondary | ICD-10-CM

## 2021-07-11 DIAGNOSIS — F1910 Other psychoactive substance abuse, uncomplicated: Secondary | ICD-10-CM

## 2021-07-11 NOTE — Progress Notes (Signed)
Psychotherapy Progress Note [facility] Louis Czar, PhD LP  Patient ID: Louis [name] [facility])    MRN: [id number] Therapy format: Individual psychotherapy Date: 07/11/2021      Start: 9:11a     Stop: 10:01a     Time Spent: 50 min Location: In-person   Session narrative (presenting needs, interim history, self-report of stressors and symptoms, applications of prior therapy, status changes, and interventions made in session) "Pretty good."  Last week was alone at house, had trouble feeding himself for struggling with indecision what to eat.  Sleep cycle was off further, made more decisions to lie down, nap, diffused his circadian rhythm.  Discussed "do over" for last week -- would make a list of handy lunch choices, and bite the bullet to go to bed reasonable time.  Currently in asynchronous class, so it's not a factor starting the day.  With parents home, more naturally in rhythm.  Socially, did set some time with others.  Next time would program it earlier in the day to stimulate.  Has not consistently used amber lenses.  Ideas for work boredom helpful some.  3-5pm tends to dread work.  Can dread other time commitments, too, focused on what he's "losing".  Advocated recognizing agonizing trying to happen and making authoritative decisions to work or play.  Says mother will still notice and not understand if he is hanging out, which may add sense of pressure.  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  Discussed responses to her and the possibility of going over her head.    Therapeutic modalities: Cognitive Behavioral Therapy and Solution-Oriented/Positive Psychology  Mental Status/Observations:  Appearance:   Casual     Behavior:  Appropriate  Motor:  Normal   Speech/Language:   Clear and Coherent  Affect:  Appropriate  Mood:  dysthymic  Thought process:  normal  Thought content:    WNL  Sensory/Perceptual disturbances:    WNL  Orientation:  Fully oriented  Attention:  Good    Concentration:  Good  Memory:  WNL  Insight:    Good  Judgment:   Good  Impulse Control:  Good   Risk Assessment: Danger to Self: No Self-injurious Behavior: No Danger to Others: No Physical Aggression / Violence: No Duty to Warn: No Access to Firearms a concern: No  Assessment of progress:  progressing  Diagnosis:   ICD-10-CM   1. Generalized anxiety disorder  F41.1     2. 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 Attention deficit hyperactivity disorder (ADHD), predominantly inattentive type  F90.0     4. Insomnia due to other mental disorder  F51.05    F99     5. Polysubstance abuse (HCC) - improved  F19.10     6. Sleep apnea treated with continuous positive airway pressure (CPAP)  G47.30      Plan:  Current Try out amber lenses in the evening if engaging electronic screens within 2 hrs of bedtime Make list of handy meals he can make when alone again Practice conscious consent to go to work for own good reasons, notice and get off the subject of dreading it -- either think through to an action he can take or identify a point of contentment and switch attention May take work  concerns to manager Ongoing Try paced breathing (without smoke) for relaxation and self-soothing Try to resist night eating, redirect to other activities first Continuing option to try MCT oil as a craving breaker Continue use of CPAP, self-monitor for benefit Should seek more movement/exercise Cont nicotine weaning Abstain from pot Other recommendations/advice as may be noted above Continue to utilize previously learned skills ad lib Maintain medication as prescribed and work faithfully with relevant prescriber(s) if any changes are desired or seem indicated Call the  clinic on-call service, 988/hotline, 911, or present to [facility] or ER if any life-threatening psychiatric crisis Return for session(s) already scheduled. Already scheduled visit in this office 07/23/2021.  Louis Fries, PhD Louis Czar, PhD LP Clinical Psychologist, [facility] [address], [address] [place], [place] [address] [phone]

## 2021-07-12 ENCOUNTER — Ambulatory Visit (INDEPENDENT_AMBULATORY_CARE_PROVIDER_SITE_OTHER): Payer: 59 | Admitting: Neurology

## 2021-07-12 ENCOUNTER — Encounter: Payer: Self-pay | Admitting: Neurology

## 2021-07-12 VITALS — BP 96/67 | HR 74 | Ht 70.0 in | Wt 214.0 lb

## 2021-07-12 DIAGNOSIS — R0683 Snoring: Secondary | ICD-10-CM

## 2021-07-12 DIAGNOSIS — G4719 Other hypersomnia: Secondary | ICD-10-CM

## 2021-07-12 DIAGNOSIS — G4733 Obstructive sleep apnea (adult) (pediatric): Secondary | ICD-10-CM

## 2021-07-12 NOTE — Patient Instructions (Signed)
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
## 2021-07-23 ENCOUNTER — Ambulatory Visit (INDEPENDENT_AMBULATORY_CARE_PROVIDER_SITE_OTHER): Payer: 59 | Admitting: Adult Health

## 2021-07-23 ENCOUNTER — Encounter: Payer: Self-pay | Admitting: Adult Health

## 2021-07-23 DIAGNOSIS — F1910 Other psychoactive substance abuse, uncomplicated: Secondary | ICD-10-CM | POA: Diagnosis not present

## 2021-07-23 DIAGNOSIS — F411 Generalized anxiety disorder: Secondary | ICD-10-CM

## 2021-07-23 DIAGNOSIS — F900 Attention-deficit hyperactivity disorder, predominantly inattentive type: Secondary | ICD-10-CM | POA: Diagnosis not present

## 2021-07-23 DIAGNOSIS — F331 Major depressive disorder, recurrent, moderate: Secondary | ICD-10-CM | POA: Diagnosis not present

## 2021-07-23 MED ORDER — ESCITALOPRAM OXALATE 20 MG PO TABS: 20.0000 mg | ORAL_TABLET | Freq: Every day | ORAL | 5 refills | Status: DC

## 2021-07-23 MED ORDER — METHYLPHENIDATE HCL ER (OSM) 36 MG PO TBCR: 36.0000 mg | EXTENDED_RELEASE_TABLET | Freq: Every day | ORAL | 0 refills | Status: DC

## 2021-07-23 NOTE — Progress Notes (Signed)
Louis [name] [id number] 1995-03-08 26 y.o.  Subjective:   Patient ID:  Louis [name] is a 26 y.o. (DOB 02-Dec-1995) male.  Chief Complaint: No chief complaint on file.   HPI Louis [name] presents to the office today for follow-up of MDD, ADHD, GAD, polysubstance abuse.  Describes mood today as "ok". Pleasant. Flat. Mood symptoms - reports some depression, anxiety and irritability.  Decreased panic attacks - "recent ones have ben pretty minor". Mood is consistent. Managing work and school. Stating "I'm doing alright". Taking classes at [employer]. Living at home with parents. Feels like medications are helpful. Improved interest and motivation. Taking medications as prescribed.  Energy levels lower. Active, planning to exercise more. Enjoys some usual interests and activities. Single. Not dating. Lives with parents and cat. Has hobbies he participates in. Spending time with family and friends. Appetite adequate. Weight gain - 215 to 220 pounds. Sleeps well most nights. Averages 8 hours - can be variable.  Focus and concentration stable. Completing tasks. Managing aspects of household. Taking 1 class - [employer]. 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 Denies SI or HI.  Denies AH or VH. Substance use - decreased vaping - working on getting off. History of THC - some - a few times a month. ETOH use - "a little bit, but sparingly".    GAD-7    Flowsheet Row Counselor from 03/16/2021 in [facility]  Total GAD-7 Score 14      PHQ2-9    Flowsheet Row Counselor from 03/16/2021 in [facility] Counselor from 05/03/2020 in [facility]  PHQ-2 Total Score 5 5  PHQ-9 Total Score 21 20        Review of Systems:  Review of Systems  Musculoskeletal:  Negative for gait problem.  Neurological:  Negative for tremors.  Psychiatric/Behavioral:         Please refer to HPI    Medications: I have reviewed the patient's current  medications.  Current Outpatient Medications  Medication Sig Dispense Refill   [START ON 08/20/2021] methylphenidate (CONCERTA) 36 MG PO CR tablet Take 1 tablet (36 mg total) by mouth daily. 30 tablet 0   [START ON 09/17/2021] methylphenidate (CONCERTA) 36 MG PO CR tablet Take 1 tablet (36 mg total) by mouth daily. 30 tablet 0   escitalopram (LEXAPRO) 20 MG tablet Take 1 tablet (20 mg total) by mouth daily. 30 tablet 5   methylphenidate (CONCERTA) 36 MG PO CR tablet Take 1 tablet (36 mg total) by mouth daily. 30 tablet 0   traZODone (DESYREL) 100 MG tablet Take one tablet daily. 30 tablet 5   No current facility-administered medications for this visit.    Medication Side Effects: None  Allergies: No Known Allergies  Past Medical History:  Diagnosis Date   ADHD    Anxiety and depression     Past Medical History, Surgical history, Social history, and Family history were reviewed and updated as appropriate.   Please see review of systems for further details on the patient's review from today.   Objective:   Physical Exam:  There were no vitals taken for this visit.  Physical Exam Constitutional:      General: He is not in acute distress. Musculoskeletal:        General: No deformity.  Neurological:     Mental Status: He is alert and oriented to person, place, and time.     Coordination: Coordination normal.  Psychiatric:  Attention and Perception: Attention and perception normal. He does not perceive auditory or visual hallucinations.        Mood and Affect: Mood normal. Mood is not anxious or depressed. Affect is not labile, blunt, angry or inappropriate.        Speech: Speech normal.        Behavior: Behavior normal.        Thought Content: Thought content normal. Thought content is not paranoid or delusional. Thought content does not include homicidal or suicidal ideation. Thought content does not include homicidal or suicidal plan.        Cognition and Memory:  Cognition and memory normal.        Judgment: Judgment normal.     Comments: Insight intact     Lab Review:  No results found for: "NA", "K", "CL", "CO2", "GLUCOSE", "BUN", "CREATININE", "CALCIUM", "PROT", "ALBUMIN", "AST", "ALT", "ALKPHOS", "BILITOT", "GFRNONAA", "GFRAA"  No results found for: "WBC", "RBC", "HGB", "HCT", "PLT", "MCV", "MCH", "MCHC", "RDW", "LYMPHSABS", "MONOABS", "EOSABS", "BASOSABS"  No results found for: "POCLITH", "LITHIUM"   No results found for: "PHENYTOIN", "PHENOBARB", "VALPROATE", "CBMZ"   .res Assessment: Plan:    Plan:  PDMP reviewed  Continue Lexapro 20mg  daily  Trazadone 100mg  - one tab at hs Continue Concerta 36mg  daily  BP - 109/69  Working with Dr testing results given for review.  Has ceased substance use.  RTC 4 weeks  Patient advised to contact office with any questions, adverse effects, or acute worsening in signs and symptoms.  Diagnoses and all orders for this visit:  Generalized anxiety disorder -     escitalopram (LEXAPRO) 20 MG tablet; Take 1 tablet (20 mg total) by mouth daily.  Attention deficit hyperactivity disorder (ADHD), predominantly inattentive type -     methylphenidate (CONCERTA) 36 MG PO CR tablet; Take 1 tablet (36 mg total) by mouth daily. -     methylphenidate (CONCERTA) 36 MG PO CR tablet; Take 1 tablet (36 mg total) by mouth daily. -     methylphenidate (CONCERTA) 36 MG PO CR tablet; Take 1 tablet (36 mg total) by mouth daily.  Polysubstance abuse (HCC) - improved  Major depressive disorder, recurrent episode, moderate (HCC) -     escitalopram (LEXAPRO) 20 MG tablet; Take 1 tablet (20 mg total) by mouth daily.     Please see After Visit Summary for patient specific instructions.  Future Appointments  Date Time Provider Department Center  07/25/2021  8:00 AM , PhD [facility] None  08/01/2021  9:00 AM 07/27/2021, PhD [facility] None  08/08/2021  9:00 AM 08/03/2021, PhD  [facility] None  08/17/2021 10:00 AM 10/08/2021, PhD [facility] None  09/24/2021  1:00 PM 10/17/2021, PhD [facility] None  01/14/2022  9:30 AM Dohmeier, 09/26/2021, MD [facility] None    No orders of the defined types were placed in this encounter.   -------------------------------

## 2021-07-25 ENCOUNTER — Ambulatory Visit (INDEPENDENT_AMBULATORY_CARE_PROVIDER_SITE_OTHER): Payer: 59 | Admitting: Psychiatry

## 2021-07-25 DIAGNOSIS — F900 Attention-deficit hyperactivity disorder, predominantly inattentive type: Secondary | ICD-10-CM | POA: Diagnosis not present

## 2021-07-25 DIAGNOSIS — G4721 Circadian rhythm sleep disorder, delayed sleep phase type: Secondary | ICD-10-CM

## 2021-07-25 DIAGNOSIS — F331 Major depressive disorder, recurrent, moderate: Secondary | ICD-10-CM

## 2021-07-25 DIAGNOSIS — F411 Generalized anxiety disorder: Secondary | ICD-10-CM

## 2021-07-25 DIAGNOSIS — G4730 Sleep apnea, unspecified: Secondary | ICD-10-CM

## 2021-07-25 NOTE — Progress Notes (Signed)
Psychotherapy Progress Note [facility] [name], PhD LP  Patient ID: Louis [name] [facility])    MRN: [id number] Therapy format: Individual psychotherapy Date: 07/25/2021      Start: 8:17a     Stop: 9:00a     Time Spent: 43 min Location: In-person   Session narrative (presenting needs, interim history, self-report of stressors and symptoms, applications of prior therapy, status changes, and interventions made in session) Trying to be more decisive about bedtime and night routine rather than leave it to chance falling asleep without.  Also converting to nasal pillow, starting just last night, with variety of masks to try.  1st night in, seems OK.  Followup is on PRN basis, he says, though it seems likely he would have to check out alternatives and follow up.  Encouraged to be sure and clear in feedback and questions to ask.  Agrees he has a longstanding problem with complacency.  Reinforced no such thing as a dumb question or useless feedback working with a healthcare provider.  Missed Lexapro and methylphenidate one day, felt like it took a day and a half to get right.  Has missed days now and then before, but this is more of an effect.  Best guess he's been feeling pressure about a final paper due tonight and the combination is more acute feeling.  Encouraged to go ahead and dive in this morning on the project, get it over with, with "permission to scream if necessary".  Not explicitly, but feels it may be worked out more now for mom to not repeat goodnights and disrupt his evening winddown.  Affirmed and encouraged asserting his need and helping her see it kindly.  Re. work stress, is clear he's not alone in his discontent with supervisor, just not sure yet whether to object.  Normally a coworker will cover grill for bathroom trip, but last night, when [name] was on the line, no one did, and it put him behind, got addressed critically.  Encouraged in bringing it up with his  supervisor or her superior, as needed.  Reveals again longterm habit of mom to bring up needs for him to get to, questions of motivation.  Acknowledged how language about "having" motivation (more his own thoughts, not necessarily mom's phrasing) tends to be tense-making and disabling.  Refreshed the idea that "real" freedom is taking what someone else is pushing you to do and doing it for your own, good reasons.  Agrees he needs to exercise, especially include some cardio.  Encouraged further.  Therapeutic modalities: Cognitive Behavioral Therapy and Solution-Oriented/Positive Psychology  Mental Status/Observations:  Appearance:   Casual     Behavior:  Appropriate  Motor:  Normal and some leg-bouncing  Speech/Language:   Clear and Coherent  Affect:  Constricted  Mood:  anxious  Thought process:  normal  Thought content:    WNL  Sensory/Perceptual disturbances:    WNL  Orientation:  Fully oriented  Attention:  Good, except briefly fell asleep  mostway in session    Concentration:  Good  Memory:  WNL  Insight:    Fair  Judgment:   Good  Impulse Control:  Good   Risk Assessment: Danger to Self: No Self-injurious Behavior: No Danger to Others: No Physical Aggression / Violence: No Duty to Warn: No Access to Firearms a concern: No  Assessment of progress:  progressing  Diagnosis:   ICD-10-CM   1. Generalized anxiety disorder with history of panic attacks  F41.1  2. Major depressive disorder, recurrent episode, moderate (HCC)  F33.1     3. Attention deficit hyperactivity disorder (ADHD), predominantly inattentive type  F90.0     4. 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 Sleep apnea treated with continuous positive airway pressure (CPAP)  G47.30      Plan:  Current Academic anxiety/procrastination -- dive in today on final paper Apnea care -- further assess CPAP strategy, most likely nasal pillows will work better.  Just be sure to  prevent falling asleep without using it, comprehend data, and bring up with neuro or RT if seems to be any need for refiguring, don't just accept inadequate function. Overall health, wellbeing -- seek more movement/exercise.  Don't make it about "having" motivation, just find small, doable things, like a minute or cardio.  Look into "exercise snacks", especially if they will elevate HR or get winded for a bit.  May do double duty as desensitization to panic. Noticed after session genetic testing done by psychiatry -- check later for comprehension and questions Ongoing Self-soothing -- paced breathing prn, walk if restless Food control -- resist night eating, redirect to other activities first, option to use MCT oil as a craving breaker, day or night Substances -- reduce/refrain nicotine, abstain from pot Self-care alone -- prepare list of handy meals to reduce worry about providing for self Communication with parents, esp. mother -- use assertive principles and phrasing from previous discussions Other recommendations/advice as may be noted above Continue to utilize previously learned skills ad lib Maintain medication as prescribed and work faithfully with relevant prescriber(s) if any changes are desired or seem indicated Call the clinic on-call service, 988/hotline, 911, or present to [facility] or ER if any life-threatening psychiatric crisis Return for session(s) already scheduled. Already scheduled visit in this office 08/01/2021.  Robley Fries, PhD [name], PhD LP Clinical Psychologist, [facility] [address], [address] [place], [place] [address] [phone]

## 2021-08-01 ENCOUNTER — Ambulatory Visit (INDEPENDENT_AMBULATORY_CARE_PROVIDER_SITE_OTHER): Payer: 59 | Admitting: Psychiatry

## 2021-08-01 DIAGNOSIS — F1921 Other psychoactive substance dependence, in remission: Secondary | ICD-10-CM

## 2021-08-01 DIAGNOSIS — G4721 Circadian rhythm sleep disorder, delayed sleep phase type: Secondary | ICD-10-CM

## 2021-08-01 DIAGNOSIS — F900 Attention-deficit hyperactivity disorder, predominantly inattentive type: Secondary | ICD-10-CM

## 2021-08-01 DIAGNOSIS — G4730 Sleep apnea, unspecified: Secondary | ICD-10-CM | POA: Diagnosis not present

## 2021-08-01 DIAGNOSIS — F509 Eating disorder, unspecified: Secondary | ICD-10-CM | POA: Diagnosis not present

## 2021-08-01 DIAGNOSIS — F331 Major depressive disorder, recurrent, moderate: Secondary | ICD-10-CM

## 2021-08-01 NOTE — Patient Instructions (Addendum)
Advice/recommendations we have going:  Current Apnea care -- tighten up headband, ensure good alignment, see if it stays on through the night Circadian rhythm and suspected metabolic issues -- Try using MCT oil as the only calories after food curfew, e.g., 10pm, and "keep real food for real daylight".  Work bedtimes and up times to fit school schedule in the next 3 weeks.   Communication/morale -- seek agreement with mother to be short and blunt rather than prolonged and tutorial about things she wants him to do better -- e.g., rather hear "Will you PLEASE take better care of my son?!" than go on about what he should be doing and how it just takes will power, not trying hard enough, etc.  Option to explain how strong cravings are and how maybe mom doesn't actually know what they feel like, but F and B do, but be willing to frame it as "It's hard AND I'm trying." Attention and time awareness -- try intentional meditations outside the shower, not necessarily guided recordings, e.g., 1-minute "listenings"; option to practice estimating 1 minute without a timepiece; use music or a shower clock to foster awareness of elapsed time School -- Follow through with school schedule and getting work schedule adjusted to suit Ongoing Self- soothing -- paced breathing prn, walk if restless Overall health, wellbeing -- seek more movement/exercise.  Don't make it about "having" motivation, just find small, doable things, like a minute or cardio.  Look into "exercise snacks", especially if they will elevate HR or get winded for a bit.  May do double duty as desensitization to panic. Food control -- resist night eating, redirect to other activities first, use MCT oil as a craving breaker, day or night Health awareness -- check comprehension and questions re. genetic testing Substances -- reduce/refrain nicotine, abstain from pot

## 2021-08-01 NOTE — Progress Notes (Signed)
Psychotherapy Progress Note [facility] [name], PhD LP  Patient ID: [name])    MRN: [id number] Therapy format: Individual psychotherapy Date: 08/01/2021      Start: 9:20a     Stop: 10:10a     Time Spent: 50 min Location: In-person   Session narrative (presenting needs, interim history, self-report of stressors and symptoms, applications of prior therapy, status changes, and interventions made in session) Been staying up more lately, getting circadian rhythm out of whack.  While family is dogsitting, mom is sleeping next room, and last night he bumped the dog bowl, woke up mom about 2:30am, got lectured a while about waking her up, then she tried several times waking him up this morning before his alarm.  Would like mother to approach him differently about this.  Encouraged to have some boundary conversation with mom about sparing the lectures and the taking over and let him work with his own issues.  CPAP mask is sliding off now, waking up with it on the floor and only 1 hour credit.  Most likely too loose, coming off with turning over, recommend tightening the adjustments.    Says the food cravings he has are tremendous, and that father and brother both have similar struggle Recommended try MCT as his only night feeding, leave real food for real daylight, hopefully reset digestive contribution to circadian rhythm.    Coached in taking an "enough for today, there will be some tomorrow" perspective on time playing or working his video game skills.    Acknowledges zoning out in the shower -- feeling the water, not seeing anything.  Acknowledges it's meditative   School is scheduled, on plan, has all his admin requirements set up.  Taking Calculus I @ 9am 4 days a week.    Therapeutic modalities: Cognitive Behavioral Therapy, Solution-Oriented/Positive Psychology, and Psycho-education/Bibliotherapy  Mental Status/Observations:  Appearance:   Casual      Behavior:  Appropriate and Drowsy  Motor:  Normal  Speech/Language:   Clear and Coherent  Affect:  Appropriate  Mood:  normal  Thought process:  normal and except when nodding off briefly  Thought content:    WNL  Sensory/Perceptual disturbances:    WNL  Orientation:  Fully oriented  Attention:  Good    Concentration:  Fair  Memory:  WNL  Insight:    Good  Judgment:   Good  Impulse Control:  Good   Risk Assessment: Danger to Self: No Self-injurious Behavior: No Danger to Others: No Physical Aggression / Violence: No Duty to Warn: No Access to Firearms a concern: No  Assessment of progress:  situational setback(s)  Diagnosis:   ICD-10-CM   1. Major depressive disorder, recurrent episode, moderate (HCC)  F33.1     2. 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 Sleep apnea treated with continuous positive airway pressure (CPAP)  G47.30     4. Compulsive eating patterns (r/o carb addiction)  F50.9     5. Attention deficit hyperactivity disorder (ADHD), predominantly inattentive type  F90.0     6. Polysubstance dependence in early, early partial, sustained full, or sustained partial remission (HCC)  F19.21      Plan:  Current Apnea care -- tighten up headband, ensure good alignment, see if it stays on through the night Circadian rhythm and suspected metabolic issues -- Try using MCT oil as the only calories after food curfew, e.g., 10pm, and "keep real food for real  daylight".  Work bedtimes and up times to fit school schedule in the next 3 weeks.   Communication/morale -- seek agreement with mother to be short and blunt rather than prolonged and tutorial about things she wants him to do better -- e.g., rather hear "Will you PLEASE take better care of my son?!" than go on about what he should be doing and how it just takes will power, not trying hard enough, etc.  Option to explain how strong cravings are and how maybe mom doesn't actually know  what they feel like, but F and B do, but be willing to frame it as "It's hard AND I'm trying." Attention and time awareness -- try intentional meditations outside the shower, not necessarily guided recordings, e.g., 1-minute "listenings"; option to practice estimating 1 minute without a timepiece; use music or a shower clock to foster awareness of elapsed time School -- Follow through with school schedule and getting work schedule adjusted to suit Ongoing Self- soothing -- paced breathing prn, walk if restless Overall health, wellbeing -- seek more movement/exercise.  Don't make it about "having" motivation, just find small, doable things, like a minute or cardio.  Look into "exercise snacks", especially if they will elevate HR or get winded for a bit.  May do double duty as desensitization to panic. Food control -- resist night eating, redirect to other activities first, use MCT oil as a craving breaker, day or night Health awareness -- check comprehension and questions re. genetic testing Substances -- reduce/refrain nicotine, abstain from pot Other recommendations/advice as may be noted above Continue to utilize previously learned skills ad lib Maintain medication as prescribed and work faithfully with relevant prescriber(s) if any changes are desired or seem indicated Call the clinic on-call service, 988/hotline, 911, or present to [facility] or ER if any life-threatening psychiatric crisis Return 1-2 wks. Already scheduled visit in this office 08/08/2021.  Robley Fries, PhD [name], PhD LP Clinical Psychologist, [facility] [address] [place], [place] [address] [phone]

## 2021-08-08 ENCOUNTER — Ambulatory Visit (INDEPENDENT_AMBULATORY_CARE_PROVIDER_SITE_OTHER): Payer: 59 | Admitting: Psychiatry

## 2021-08-08 DIAGNOSIS — F900 Attention-deficit hyperactivity disorder, predominantly inattentive type: Secondary | ICD-10-CM

## 2021-08-08 DIAGNOSIS — F509 Eating disorder, unspecified: Secondary | ICD-10-CM

## 2021-08-08 DIAGNOSIS — F411 Generalized anxiety disorder: Secondary | ICD-10-CM

## 2021-08-08 DIAGNOSIS — F99 Mental disorder, not otherwise specified: Secondary | ICD-10-CM

## 2021-08-08 DIAGNOSIS — G4730 Sleep apnea, unspecified: Secondary | ICD-10-CM

## 2021-08-08 DIAGNOSIS — F1910 Other psychoactive substance abuse, uncomplicated: Secondary | ICD-10-CM

## 2021-08-08 DIAGNOSIS — F331 Major depressive disorder, recurrent, moderate: Secondary | ICD-10-CM

## 2021-08-08 DIAGNOSIS — G4721 Circadian rhythm sleep disorder, delayed sleep phase type: Secondary | ICD-10-CM

## 2021-08-08 DIAGNOSIS — F5105 Insomnia due to other mental disorder: Secondary | ICD-10-CM

## 2021-08-08 NOTE — Progress Notes (Signed)
Psychotherapy Progress Note [facility] [name], PhD LP  Patient ID: Louis [name] [facility])    MRN: [id number] Therapy format: Individual psychotherapy Date: 08/08/2021      Start: 9:17a     Stop: 10:04a     Time Spent: 47 min Location: In-person   Session narrative (presenting needs, interim history, self-report of stressors and symptoms, applications of prior therapy, status changes, and interventions made in session) Working out CPAP problems, noticed seal is OK but worried about use statistics maybe underestimating use and spoiling insurance coverage.  Says mother will get irritated seeing small numbers for use time, but he knows she is jumping to conclusions and not understanding lag time for the CPAP vendor to aggregate data.  Assured that poor use stats probably don't cause insurance to rescind approval, but it is clinically significant to work together with treating physician and RT to get it to work better.  Recent flap with parents about going to a concert in [place] with friend [name], whose GF smelled like pot.  Able to reconcile about the fact that the smell would be risky for police action, but caught grief from parents suggesting they think he'll be automatic to relapse on it himself if he's around the smell.  C/o mom "dogpiling" (kitchen-sinking) complaints whenever she is put off or ill at ease.  Was able somewhat to address her inarticulate concerns and acknowledge the potential legal risk of traveling in a car that smelled of pot, but irritated at her exaggerated judgment that they were "hotboxing" the car, and the suggestion he would be so influenced by his friends' girlfriend as to throw away the self-control he regained over a year ago.  Clear here that he does not want to go back to pot addiction, have the dull mind he had, or have everything smell like it.  While he did use at the concert, it was a controlled environment, which did not involve DWI or  other safety risks.  Also clear that the does not want a girlfriend like [name]'s, and just because [name] is in a hot, hormonal phase dating her (first big relationship, at 71), Gennaro has already been through his own head over heels phase, and is not prone to repeat.  Affirmed sober perspective, encouraged keep his discretion about even occasional recreational pot use.  For parent communication, coached in using active listening, especially in acknowledging and/or assertively inquiring what parents', and especially mother's, core worries are.  Advocated that demonstrating his own emotional comprehension of her before trying to reason or explain his own points will go much farther, with less risk of "dogpiling", alienation.  Acknowledge that father's use of the phrase "I do not approve of her" ([name]'s GF) came across elitist and offputting, but he's big enough to realize that it's an indirect way of saying "I'm worried for you, Randi."  In GI distress this morning, IBS acting up, despite not eating a lot last night nor indulging in a carb binge.  Briefed on likely ideas for gut environment skewing GI nervous system function, likely help that probiotic and prebiotic fiber could offer, and allowed to break early to make bathroom.  Otherwise, most likely related to sleep, nutrition, conflict, and possibly pot exposure.  Still set to begin fall semester at full or near full time load, this time including Calculus.  Affirmed and encouraged.  Therapeutic modalities: Cognitive Behavioral Therapy, Solution-Oriented/Positive Psychology, Ego-Supportive, and Assertiveness/Communication  Mental Status/Observations:  Appearance:   Casual  Behavior:  Appropriate  Motor:  Normal  Speech/Language:   Clear and Coherent and more talkative  Affect:  Appropriate  Mood:  Dysthymic, modulated  Thought process:  normal  Thought content:    WNL  Sensory/Perceptual disturbances:    WNL exc GI pain  Orientation:   Fully oriented  Attention:  Good    Concentration:  Good  Memory:  WNL  Insight:    Good  Judgment:   Good  Impulse Control:  Good   Risk Assessment: Danger to Self: No Self-injurious Behavior: No Danger to Others: No Physical Aggression / Violence: No Duty to Warn: No Access to Firearms a concern: No  Assessment of progress:  progressing  Diagnosis:   ICD-10-CM   1. Major depressive disorder, recurrent episode, moderate (HCC)  F33.1     2. 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 Sleep apnea treated with continuous positive airway pressure (CPAP)  G47.30     4. Compulsive eating patterns (r/o carb addiction)  F50.9     5. Attention deficit hyperactivity disorder (ADHD), predominantly inattentive type  F90.0     6. Generalized anxiety disorder with history of panic attacks  F41.1     7. Polysubstance abuse ([place]) - improved  F19.10     8. Insomnia due to other mental disorder  F51.05    F99      Plan:  Current Communication/morale -- seek agreement with mother to be short and blunt rather than prolonged and tutorial about things she wants him to do better -- e.g., rather hear "Will you PLEASE take better care of my son?!" than go on about what he should be doing and how it just takes will power, not trying hard enough, etc.  Option to explain how strong cravings are and how maybe mom doesn't actually know what they feel like, but F and B do, but be willing to frame it as "It's hard AND I'm trying." Attention and time awareness -- try intentional meditations outside the shower, not necessarily guided recordings, e.g., 1-minute "listenings"; option to practice estimating 1 minute without a timepiece; use music or a shower clock to foster awareness of elapsed time Ongoing Self-soothing -- paced breathing prn, walk if restless Sleep and circadian management -- Blue light curfew or amber lenses.  Keep CPAP in good order and improve use.  Try using  MCT oil as the only calories after food curfew, e.g., 10pm, and "keep real food for real daylight".  Work bedtimes and up times to fit school schedule in the next 3 weeks.   Overall health, wellbeing -- seek more movement/exercise.  Don't make it about "having" motivation, just find small, doable things, like a minute or cardio.  Look into "exercise snacks", especially if they will elevate HR or get winded for a bit.  May do double duty as desensitization to panic. Food control -- resist night eating, redirect to other activities first, use MCT oil as a craving breaker, day or night Health awareness -- check comprehension and questions re. genetic testing Substances -- reduce/refrain nicotine, abstain from pot Academic recovery/progress -- Follow through with schedule and rectifying with work schedule Communication with parents -- Continue agreement to let good night be good night.  Cont assert as needed when feedback is guilting, catastrophizing, or "dogpiling" Other recommendations/advice as may be noted above Continue to utilize previously learned skills ad lib Maintain medication as prescribed and work faithfully with relevant prescriber(s) if any  changes are desired or seem indicated Call the clinic on-call service, 988/hotline, 911, or present to [facility] or ER if any life-threatening psychiatric crisis Return for session(s) already scheduled. Already scheduled visit in this office 08/17/2021.  Robley Fries, PhD [name], PhD LP Clinical Psychologist, [facility] [address] [place], [place] [address] [phone]

## 2021-08-17 ENCOUNTER — Ambulatory Visit (INDEPENDENT_AMBULATORY_CARE_PROVIDER_SITE_OTHER): Payer: 59 | Admitting: Psychiatry

## 2021-08-17 DIAGNOSIS — G4730 Sleep apnea, unspecified: Secondary | ICD-10-CM

## 2021-08-17 DIAGNOSIS — F331 Major depressive disorder, recurrent, moderate: Secondary | ICD-10-CM | POA: Diagnosis not present

## 2021-08-17 DIAGNOSIS — G4721 Circadian rhythm sleep disorder, delayed sleep phase type: Secondary | ICD-10-CM | POA: Diagnosis not present

## 2021-08-17 DIAGNOSIS — F509 Eating disorder, unspecified: Secondary | ICD-10-CM

## 2021-08-17 DIAGNOSIS — F411 Generalized anxiety disorder: Secondary | ICD-10-CM

## 2021-08-17 DIAGNOSIS — F900 Attention-deficit hyperactivity disorder, predominantly inattentive type: Secondary | ICD-10-CM

## 2021-08-17 DIAGNOSIS — F1910 Other psychoactive substance abuse, uncomplicated: Secondary | ICD-10-CM

## 2021-08-17 NOTE — Progress Notes (Signed)
Psychotherapy Progress Note [facility] 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  Feeling more capable with his craft for having done it.    School starts next week, no real apprehension.  Will audit Calculus I this semester to help be ready for Calculus II next semester.    Sleep has varied, was getting 10 hrs a couple nights, 6 the last couple.  Overall, trying to move back to school schedule, working still on giving himself a clear end of he day and time to go to bed.  Main factor seem to be enjoying his alone time in the night, and not wanting to turn loose of it.  Probed ability to establish "me" time in the day, but hard to fathom.  Coached in making practical "asks" of mother, mainly, to establish some uninterruptible time in daylight ("lunch hour", e.g.) to play, or whatever.  Also coached in using affirmative language ("Please __" instead of "Please don't ___") when asking.    Discussed difficult to describe issue of feeling stuck.  Emphasized good sleep, tuning into his purposes and goals, and being willing to break down what he asks of himself into small enough pieces to just get started.     Therapeutic modalities: Cognitive Behavioral Therapy and Solution-Oriented/Positive Psychology  Mental Status/Observations:  Appearance:   Casual     Behavior:  Appropriate  Motor:  Normal  Speech/Language:   Clear and Coherent  Affect:  Appropriate  Mood:  dysthymic  Thought  process:  normal  Thought content:    WNL  Sensory/Perceptual disturbances:    WNL  Orientation:  Fully oriented  Attention:  Good    Concentration:  Good  Memory:  WNL  Insight:    Good  Judgment:   Good  Impulse Control:  Good   Risk Assessment: Danger to Self: No Self-injurious Behavior: No Danger to Others: No Physical Aggression / Violence: No Duty to Warn: No Access to Firearms a concern: No  Assessment of progress:  progressing  Diagnosis:   ICD-10-CM   1. Major depressive disorder, recurrent episode, moderate (HCC)  F33.1     2. 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 Sleep apnea treated with continuous positive airway pressure (CPAP)  G47.30     4. Compulsive eating patterns (r/o carb addiction)  F50.9     5. Attention deficit hyperactivity disorder (ADHD), predominantly inattentive type  F90.0     6. Generalized anxiety disorder with history of panic attacks  F41.1     7. Polysubstance abuse (HCC) - improved  F19.10      Plan:  Current Project to notice moments in life where he feels paralyzed, uncertain, hard to initiate, e.g., hungry and need to make food, and treat them same as music mistakes -- "play through" -- or just start the thing he already knows he could do but bring full freedom to emote (scream, internally, at least, or permission to say "I'm really uncomfortable" .Marland KitchenMarland Kitchen  while doing it, etc.) Continue driving toward school sleep schedule to minimize circadian effects when school starts Ongoing Self-soothing -- paced breathing prn, walk if restless Sleep and circadian management -- Blue light curfew or amber lenses.  Keep CPAP in good order and improve use.  Try using MCT oil as the only calories after food curfew, e.g., 10pm, and "keep real food for real daylight".  Work bedtimes and up times to fit school schedule in the next 3 weeks.   Overall health, wellbeing -- seek more movement/exercise.  Don't make it about  "having" motivation, just find small, doable things, like a minute or cardio.  Look into "exercise snacks", especially if they will elevate HR or get winded for a bit.  May do double duty as desensitization to panic. Food control -- resist night eating, redirect to other activities first, use MCT oil as a craving breaker, day or night Health awareness -- check comprehension and questions re. genetic testing Substances -- reduce/refrain nicotine, abstain from pot Attention and time awareness -- try 1-minute "listenings" as mindfulness practice; option to practice estimating 1 minute elapsed without using a timepiece; use music or a shower clock to foster awareness of elapsed time in shower Academic recovery/progress -- Follow through with schedule and rectifying with work schedule.  Address paralysis/procrastination with self-reminding his purposes/goals, and breaking down task requirements until can take first step. Communication with parents -- Continue agreement to let good night be good night.  Cont assert as needed when feedback is guilting, catastrophizing, or "dogpiling".  Seek agreement with mother to be short and blunt rather than prolonged and tutorial about things she wants him to do better -- e.g., rather hear "Will you PLEASE take better care of my son?!" than go on about what he should be doing and how it just takes will power, not trying hard enough, etc.  Option to explain how strong cravings are and how maybe mom doesn't actually know what they feel like, but F and B do, but be willing to frame it as "It's hard AND I'm trying." Other recommendations/advice as may be noted above Continue to utilize previously learned skills ad lib Maintain medication as prescribed and work faithfully with relevant prescriber(s) if any changes are desired or seem indicated Call the clinic on-call service, 988/hotline, 911, or present to [facility] or ER if any life-threatening psychiatric crisis Return for  session(s) already scheduled. Already scheduled visit in this office 09/24/2021.  Louis Fries, PhD [name], PhD LP Clinical Psychologist, [facility] [address] [place], [place] [address] [phone]

## 2021-09-19 ENCOUNTER — Ambulatory Visit: Payer: 59 | Admitting: Psychiatry

## 2021-09-24 ENCOUNTER — Ambulatory Visit (INDEPENDENT_AMBULATORY_CARE_PROVIDER_SITE_OTHER): Payer: 59 | Admitting: Psychiatry

## 2021-09-24 DIAGNOSIS — F1910 Other psychoactive substance abuse, uncomplicated: Secondary | ICD-10-CM

## 2021-09-24 DIAGNOSIS — F411 Generalized anxiety disorder: Secondary | ICD-10-CM | POA: Diagnosis not present

## 2021-09-24 DIAGNOSIS — G4730 Sleep apnea, unspecified: Secondary | ICD-10-CM

## 2021-09-24 DIAGNOSIS — F900 Attention-deficit hyperactivity disorder, predominantly inattentive type: Secondary | ICD-10-CM

## 2021-09-24 DIAGNOSIS — F331 Major depressive disorder, recurrent, moderate: Secondary | ICD-10-CM | POA: Diagnosis not present

## 2021-09-24 NOTE — Progress Notes (Signed)
Psychotherapy Progress Note [facility] Luan Moore, PhD LP  Patient ID: Louis [name] [facility])    MRN: [id number] Therapy format: Individual psychotherapy Date: 09/24/2021      Start: 1:10p     Stop: 1:56p     Time Spent: 46 min Location: In-person   Session narrative (presenting needs, interim history, self-report of stressors and symptoms, applications of prior therapy, status changes, and interventions made in session) School has launched for the semester, has 3 in person classes, some days two trips to campus.  Likes the immediacy and command of his attention being in person more than online.  Would like to quit [employer] and get an on-campus job.  Has become pretty senior already among staff, and many evenings it's understaffed.  Has contact info for seeking job on campus.  As classes go, Calculus (I, a re-do) is intimidating, but good professor, feels it's communicated well.  One computer course with a baffling instructor, but can see himself contacting help with TAs, encouraged to do so.  Feels he would benefit from being on campus more of the time, though Tu/Thu means a long gap between morning and afternoon classes.    Briefed on MTHFR finding from last summer.  On consultation with psychiatry, recommended add B complex, if not MVI.  If it does not help after becoming steady on it, check with psychiatry about methylfolate prescription.  Generally, feeling more like his old self, in a good way.  Social skills/reflexes are coming back from pandemic-related suppression and disuse.  Has met a couple casual friends through class, and is getting interested in a club or two (esports/game team, for one).  Not feeling too terribly like "the old man", seeing some 30s around and multiple transfer students.  Finding something of a friend group.  Also sees 8-9 girls of possible interest, none obsessions.  Happy to say [place] is not the sleepy post-high school he thought it might turn  into, nor the "rich ghetto" (rich kids selling drugs) he saw at [facility].    Morale at home is good interacting with parents, no sense of pressure mounting.     Improved circadian rhythm and food discipline.  Nasal pillows have worked out well for CPAP masking.    Therapeutic modalities: Cognitive Behavioral Therapy and Solution-Oriented/Positive Psychology  Mental Status/Observations:  Appearance:   Casual     Behavior:  Appropriate  Motor:  Normal  Speech/Language:   Clear and Coherent  Affect:  Somewhat freer  Mood:  More normal  Thought process:  normal  Thought content:    WNL  Sensory/Perceptual disturbances:    WNL  Orientation:  Fully oriented  Attention:  Good    Concentration:  Good  Memory:  WNL  Insight:    Good  Judgment:   Good  Impulse Control:  Good   Risk Assessment: Danger to Self: No Self-injurious Behavior: No Danger to Others: No Physical Aggression / Violence: No Duty to Warn: No Access to Firearms a concern: No  Assessment of progress:  progressing  Diagnosis:   ICD-10-CM   1. Major depressive disorder, recurrent episode, moderate (HCC)  F33.1    improving     2. Attention deficit hyperactivity disorder (ADHD), predominantly inattentive type  F90.0     3. Generalized anxiety disorder with history of panic attacks  F41.1     4. Sleep apnea treated with continuous positive airway pressure (CPAP)  G47.30     5. Polysubstance abuse ([place]) - improved  F19.10      Plan:  Current Nutrition: Recommend add B complex to better provide brain-helpful nutrition in light of MTHFR issue.  Achieve steady use, and if insufficient, look into methylfolate with psychiatry MVI probably also worthwhile given issues and some degraded nutrition; micronutrient supplementation may help stem night hunger as well Continue practice of resisting night eating and dedicating to go to bed a little hungry  Academic stress/anxiety: Contact TA for computer class Work through  [employer] I as intended, supplement with TA or [employer] as needed Social/morale: Continue exploring friend group [place] on-campus job instead of fast food Endorse exploring possible dating Ongoing Anxiety management -- Paced breathing prn, walk if restless.  Notice moments of motivational paralysis and treat them same as music mistakes -- "play through", a "note" at a time, or full freedom to emote (scream, internally, at least, or permission to say "I'm really uncomfortable" ...) while doing it.  Don't make it about "having" motivation, just find small, doable things to break inertia. Sleep and circadian management -- Blue light curfew or amber lenses.  Keep CPAP in good order and improve use.  Try using MCT oil as the only calories after food curfew, e.g., 10pm, and "keep real food for real daylight".  Work bedtimes and up times to fit school schedule in the next 3 weeks.   Overall health, wellbeing -- Seek more movement/exercise.  Look into "exercise snacks", especially if they will elevate HR or get winded for a bit.  May do double duty as desensitization to panic. Food control -- resist night eating, redirect to other activities first, use MCT oil as a craving breaker, day or night Substances -- reduce/refrain nicotine, abstain from pot, keep alcohol moderate  Attention and time awareness -- try 1-minute "listenings" as mindfulness practice; option to practice estimating 1 minute elapsed without using a timepiece; use music or a shower clock to foster awareness of elapsed time in shower Academic recovery/progress -- Follow through with schedule and rectifying with work schedule.  Address paralysis/procrastination with self-reminding his purposes/goals, and breaking down task requirements until can take first step. Communication with parents -- Continue agreement to let good night be good night, without late interruptions or veiled late attempts to motivate or caution.  Cont assert as needed  if feedback is guilting, catastrophizing, or "dogpiling".  Seek agreement with mother to be short and blunt rather than prolonged and tutorial about things she wants him to do better or try harder -- e.g., how he'd rather hear "Will you PLEASE take better care of my son?!" than go on about what he should be doing, how it just takes will power, not trying hard enough, etc.  Option to explain how strong cravings are and how maybe mom doesn't actually know what they feel like, but F and B do.  Willing to frame it as "It is hard AND I am trying." Other recommendations/advice as may be noted above Continue to utilize previously learned skills ad lib Maintain medication as prescribed and work faithfully with relevant prescriber(s) if any changes are desired or seem indicated Call the clinic on-call service, 988/hotline, 911, or present to [facility] or ER if any life-threatening psychiatric crisis Return for session(s) already scheduled. Already scheduled visit in this office 10/09/2021.  Blanchie Serve, PhD Luan Moore, PhD LP Clinical Psychologist, [facility] [address] [place], [place] [address] [phone]

## 2021-10-09 ENCOUNTER — Ambulatory Visit (INDEPENDENT_AMBULATORY_CARE_PROVIDER_SITE_OTHER): Payer: Self-pay | Admitting: Psychiatry

## 2021-10-09 DIAGNOSIS — F331 Major depressive disorder, recurrent, moderate: Secondary | ICD-10-CM | POA: Insufficient documentation

## 2021-10-09 DIAGNOSIS — Z91199 Patient's noncompliance with other medical treatment and regimen due to unspecified reason: Secondary | ICD-10-CM

## 2021-10-09 NOTE — Progress Notes (Signed)
No-show/Short-notice cancellation note Luan Moore, PhD, [facility]  Patient ID: Louis [name]     MRN: [id number]     Date: 10/09/2021     Appt time: 11am  Failed to show for appointment at [place] as able.  Blanchie Serve, PhD Luan Moore, PhD LP Clinical Psychologist, [facility] [address] [place], [place] [address] [phone]

## 2021-10-09 NOTE — Progress Notes (Deleted)
Psychotherapy Progress Note [facility] Luan Moore, PhD LP  Patient ID: Arlon Bleier [facility])    MRN: [id number] Therapy format: {Therapy Types:21967::"Individual psychotherapy"} Date: 10/09/2021      Start: ***:***     Stop: ***:***     Time Spent: *** min Location: {SvcLoc:22530::"In-person"}   Session narrative (presenting needs, interim history, self-report of stressors and symptoms, applications of prior therapy, status changes, and interventions made in session) ***  Therapeutic modalities: {AM:23362::"Cognitive Behavioral Therapy","Solution-Oriented/Positive Psychology"}  Mental Status/Observations:  Appearance:   {PSY:22683}     Behavior:  {PSY:21022743}  Motor:  {PSY:22302}  Speech/Language:   {PSY:22685}  Affect:  {PSY:22687}  Mood:  {PSY:31886}  Thought process:  {PSY:31888}  Thought content:    {PSY:[phone]}  Sensory/Perceptual disturbances:    {PSY:979-005-8501}  Orientation:  {Psych Orientation:23301::"Fully oriented"}  Attention:  {Good-Fair-Poor ratings:23770::"Good"}    Concentration:  {Good-Fair-Poor ratings:23770::"Good"}  Memory:  {PSY:310 412 7350}  Insight:    {Good-Fair-Poor ratings:23770::"Good"}  Judgment:   {Good-Fair-Poor ratings:23770::"Good"}  Impulse Control:  {Good-Fair-Poor ratings:23770::"Good"}   Risk Assessment: Danger to Self: {Risk:22599::"No"} Self-injurious Behavior: {Risk:22599::"No"} Danger to Others: {Risk:22599::"No"} Physical Aggression / Violence: {Risk:22599::"No"} Duty to Warn: {AMYesNo:22526::"No"} Access to Firearms a concern: {AMYesNo:22526::"No"}  Assessment of progress:  {Progress:22147::"progressing"}  Diagnosis: No diagnosis found. Plan:  *** Other recommendations/advice as may be noted above Continue to utilize previously learned skills ad lib Maintain medication as prescribed and work faithfully with relevant prescriber(s) if any changes are desired or seem indicated Call the clinic on-call  service, 988/hotline, 911, or present to [facility] or ER if any life-threatening psychiatric crisis No follow-ups on file. Already scheduled visit in this office 10/23/2021.  Blanchie Serve, PhD Luan Moore, PhD LP Clinical Psychologist, [facility] [address], [place], [place] [address] [phone]

## 2021-10-23 ENCOUNTER — Encounter: Payer: Self-pay | Admitting: Adult Health

## 2021-10-23 ENCOUNTER — Ambulatory Visit (INDEPENDENT_AMBULATORY_CARE_PROVIDER_SITE_OTHER): Payer: 59 | Admitting: Adult Health

## 2021-10-23 DIAGNOSIS — F900 Attention-deficit hyperactivity disorder, predominantly inattentive type: Secondary | ICD-10-CM

## 2021-10-23 DIAGNOSIS — F331 Major depressive disorder, recurrent, moderate: Secondary | ICD-10-CM

## 2021-10-23 DIAGNOSIS — F411 Generalized anxiety disorder: Secondary | ICD-10-CM

## 2021-10-23 DIAGNOSIS — F5105 Insomnia due to other mental disorder: Secondary | ICD-10-CM | POA: Diagnosis not present

## 2021-10-23 DIAGNOSIS — F99 Mental disorder, not otherwise specified: Secondary | ICD-10-CM

## 2021-10-23 MED ORDER — METHYLPHENIDATE HCL ER (OSM) 36 MG PO TBCR: 36.0000 mg | EXTENDED_RELEASE_TABLET | Freq: Every day | ORAL | 0 refills | Status: DC

## 2021-10-23 MED ORDER — TRAZODONE HCL 100 MG PO TABS: ORAL_TABLET | ORAL | 5 refills | Status: DC

## 2021-10-23 MED ORDER — ESCITALOPRAM OXALATE 20 MG PO TABS: 20.0000 mg | ORAL_TABLET | Freq: Every day | ORAL | 5 refills | Status: DC

## 2021-10-23 NOTE — Progress Notes (Signed)
Louis [name] [id number] [birth year] 26 y.o.  Subjective:   Patient ID:  Louis [name] is a 26 y.o. (DOB [birth year]) male.  Chief Complaint: No chief complaint on file.   HPI Louis [name] presents to the office today for follow-up of MDD, ADHD, GAD, polysubstance abuse.  Describes mood today as "ok". Pleasant. Flat. Mood symptoms - reports some depression, anxiety and irritability - "I think it's ok". Denies recent panic attacks. Mood is consistent. Stating "I'm doing alright". Feels like medications are helpful. Taking 4 classes at [employer] this semester. Living at home with parents. Stable interest and motivation. Taking medications as prescribed.  Energy levels stable. Active, going to the gym 2 times a week. Enjoys some usual interests and activities. Single. Not dating. Lives with parents and cat. Spending time with family and friends. Appetite adequate. Weight stable - 215 to 220 pounds. Sleeps well most nights. Averages 6 to 8 hours - can be variable.  Focus and concentration stable. Completing tasks. Managing aspects of household. Taking 4 classes - [employer]. 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 Denies SI or HI.  Denies AH or VH. Substance use - vapes, but trying to taper off History of THC - a few times a month. ETOH use - a few times a month.    GAD-7    Flowsheet Row Counselor from 03/16/2021 in [facility]  Total GAD-7 Score 14      PHQ2-9    Flowsheet Row Counselor from 03/16/2021 in [facility] Counselor from 05/03/2020 in [facility]  PHQ-2 Total Score 5 5  PHQ-9 Total Score 21 20        Review of Systems:  Review of Systems  Musculoskeletal:  Negative for gait problem.  Neurological:  Negative for tremors.  Psychiatric/Behavioral:         Please refer to HPI    Medications: I have reviewed the patient's current medications.  Current Outpatient Medications  Medication Sig Dispense Refill    escitalopram (LEXAPRO) 20 MG tablet Take 1 tablet (20 mg total) by mouth daily. 30 tablet 5   methylphenidate (CONCERTA) 36 MG PO CR tablet Take 1 tablet (36 mg total) by mouth daily. 30 tablet 0   [START ON 11/20/2021] methylphenidate (CONCERTA) 36 MG PO CR tablet Take 1 tablet (36 mg total) by mouth daily. 30 tablet 0   [START ON 12/18/2021] methylphenidate (CONCERTA) 36 MG PO CR tablet Take 1 tablet (36 mg total) by mouth daily. 30 tablet 0   traZODone (DESYREL) 100 MG tablet Take one tablet daily. 30 tablet 5   No current facility-administered medications for this visit.    Medication Side Effects: None  Allergies: No Known Allergies  Past Medical History:  Diagnosis Date   ADHD    Anxiety and depression     Past Medical History, Surgical history, Social history, and Family history were reviewed and updated as appropriate.   Please see review of systems for further details on the patient's review from today.   Objective:   Physical Exam:  There were no vitals taken for this visit.  Physical Exam Constitutional:      General: He is not in acute distress. Musculoskeletal:        General: No deformity.  Neurological:     Mental Status: He is alert and oriented to person, place, and time.     Coordination: Coordination normal.  Psychiatric:        Attention and Perception: Attention  and perception normal. He does not perceive auditory or visual hallucinations.        Mood and Affect: Mood normal. Mood is not anxious or depressed. Affect is not labile, blunt, angry or inappropriate.        Speech: Speech normal.        Behavior: Behavior normal.        Thought Content: Thought content normal. Thought content is not paranoid or delusional. Thought content does not include homicidal or suicidal ideation. Thought content does not include homicidal or suicidal plan.        Cognition and Memory: Cognition and memory normal.        Judgment: Judgment normal.     Comments:  Insight intact     Lab Review:  No results found for: "NA", "K", "CL", "CO2", "GLUCOSE", "BUN", "CREATININE", "CALCIUM", "PROT", "ALBUMIN", "AST", "ALT", "ALKPHOS", "BILITOT", "GFRNONAA", "GFRAA"  No results found for: "WBC", "RBC", "HGB", "HCT", "PLT", "MCV", "MCH", "MCHC", "RDW", "LYMPHSABS", "MONOABS", "EOSABS", "BASOSABS"  No results found for: "POCLITH", "LITHIUM"   No results found for: "PHENYTOIN", "PHENOBARB", "VALPROATE", "CBMZ"   .res Assessment: Plan:    Plan:  PDMP reviewed  Continue Lexapro 20mg  daily  Trazadone 100mg  - one tab at hs Continue Concerta 36mg  daily  BP - 138/75/74  Working with Dr Elroy Channel testing available.  RTC 3 months  Patient advised to contact office with any questions, adverse effects, or acute worsening in signs and symptoms.   Diagnoses and all orders for this visit:  Generalized anxiety disorder -     escitalopram (LEXAPRO) 20 MG tablet; Take 1 tablet (20 mg total) by mouth daily.  Major depressive disorder, recurrent episode, moderate (HCC) -     escitalopram (LEXAPRO) 20 MG tablet; Take 1 tablet (20 mg total) by mouth daily.  Insomnia due to other mental disorder -     traZODone (DESYREL) 100 MG tablet; Take one tablet daily.  Attention deficit hyperactivity disorder (ADHD), predominantly inattentive type -     methylphenidate (CONCERTA) 36 MG PO CR tablet; Take 1 tablet (36 mg total) by mouth daily. -     methylphenidate (CONCERTA) 36 MG PO CR tablet; Take 1 tablet (36 mg total) by mouth daily. -     methylphenidate (CONCERTA) 36 MG PO CR tablet; Take 1 tablet (36 mg total) by mouth daily.     Please see After Visit Summary for patient specific instructions.  Future Appointments  Date Time Provider [place]  10/24/2021  1:00 PM Blanchie Serve, PhD CP-CP None  11/08/2021 11:00 AM Blanchie Serve, PhD CP[facility] None  11/21/2021  2:00 PM Blanchie Serve, PhD CP-CP None  12/06/2021  1:00 PM Blanchie Serve,  PhD CP-CP None  12/19/2021  1:00 PM Blanchie Serve, PhD CP-CP None  02/07/2022  1:30 PM Dohmeier, Asencion Partridge, MD [facility] None    No orders of the defined types were placed in this encounter.   -------------------------------

## 2021-10-24 ENCOUNTER — Ambulatory Visit (INDEPENDENT_AMBULATORY_CARE_PROVIDER_SITE_OTHER): Payer: 59 | Admitting: Psychiatry

## 2021-10-24 DIAGNOSIS — F900 Attention-deficit hyperactivity disorder, predominantly inattentive type: Secondary | ICD-10-CM

## 2021-10-24 DIAGNOSIS — F4010 Social phobia, unspecified: Secondary | ICD-10-CM

## 2021-10-24 DIAGNOSIS — G4730 Sleep apnea, unspecified: Secondary | ICD-10-CM

## 2021-10-24 DIAGNOSIS — F1921 Other psychoactive substance dependence, in remission: Secondary | ICD-10-CM

## 2021-10-24 DIAGNOSIS — F411 Generalized anxiety disorder: Secondary | ICD-10-CM

## 2021-10-24 DIAGNOSIS — F3341 Major depressive disorder, recurrent, in partial remission: Secondary | ICD-10-CM

## 2021-10-24 NOTE — Progress Notes (Signed)
Psychotherapy Progress Note [facility] Luan Moore, PhD LP  Patient ID: Louis [name] [facility])    MRN: [id number] Therapy format: Individual psychotherapy Date: 10/24/2021      Start: 1:10p     Stop: 1:55p     Time Spent: 45 min Location: In-person   Session narrative (presenting needs, interim history, self-report of stressors and symptoms, applications of prior therapy, status changes, and interventions made in session) Methylfolate not begun, and med check note from yesterday mysteriously says testing is "available".    Facing more impulse to procrastinate, feels he has poor self-discipline.  Discussed how he labels it for himself -- "trying harder" (shaming, disabling) vs. narrowing down to task (acceptance and commitment to act) resolving to do just ___ right now.  Discussed effort intended today to work ahead on an assignment due next Friday, and tactics for activating rather than distracting.  Tomorrow morning also has a calculus assignment he wants to make some earlier progress with.  Socially & emotionally, has friend he's frustrated with, "making weird choices".  Wilhemina Cash is a [employer] grad frequently coming over to [employer] and getting obnoxiously drunk, and annoyed that he is a peer in his chosen field who has taken an easy domestic job instead of using his degree.  For one, seeing an uncomfortable amount of his parents' judgment in himself.  Validated that he has had that example, surely enough, but maybe what he's actually reacting to is disappointment, having hoped Wilhemina Cash would be more inspirational than this, and it's lonely to lose that.  Also lonely, perhaps, to be losing his friend to intoxication.  Offered it would be valid to say, if he ever wants to, how Hardin's conduct affects him, but make it about feeling left (rather than "wrong", or "betrayed").  Another source of unease right now might be knowing some people are coming over to see how their house  is built, and the state of his room could be an issue for mom's to become more tense with her need for cleaner appearances.  Knows she was raised strictly, conditioned by her own mother's demanding example, and she's struggled to live up to those expectations, keeps forgiving her tense behavior, but it means hearing/inferring not good enough messages, and her "looping" (repeating herself).  He has been able to intervene by asking her if he met an expectation, or whether some action taken helps, turning her attention from deficiency to progress made.  Considered the possibility of good-natured picking, playfully letting her know he heard "Grandma's version" of something, what's hers?  Obviously important that it not actually sound snarky.  May do.  Therapeutic modalities: Cognitive Behavioral Therapy, Solution-Oriented/Positive Psychology, Ego-Supportive, and Assertiveness/Communication  Mental Status/Observations:  Appearance:   Casual     Behavior:  Appropriate  Motor:  Normal  Speech/Language:   Clear and Coherent  Affect:  Appropriate  Mood:  anxious and dysthymic  Thought process:  normal  Thought content:    WNL  Sensory/Perceptual disturbances:    WNL  Orientation:  Fully oriented  Attention:  Good    Concentration:  Good  Memory:  WNL  Insight:    Good  Judgment:   Good  Impulse Control:  Good   Risk Assessment: Danger to Self: No Self-injurious Behavior: No Danger to Others: No Physical Aggression / Violence: No Duty to Warn: No Access to Firearms a concern: No  Assessment of progress:  stabilized  Diagnosis:   ICD-10-CM   1. Generalized anxiety  disorder  F41.1     2. 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 Attention deficit hyperactivity disorder (ADHD), predominantly inattentive type  F90.0     4. Major depressive disorder, recurrent, in partial remission ([place])  F33.41     5. Sleep apnea treated with continuous positive airway pressure (CPAP)  G47.30     6.  Polysubstance dependence in early, early partial, sustained full, or sustained partial remission ([place])  F19.21      Plan:  Current Nutrition: Recommend add B complex to better provide brain-helpful nutrition in light of MTHFR issue.  Achieve steady use, and if insufficient, look into methylfolate with psychiatry MVI probably also worthwhile given issues and some degraded nutrition; micronutrient supplementation may help stem night hunger as well Continue practice of resisting night eating and dedicating to go to bed a little hungry  Academic stress/anxiety: Go ahead with current plans to identify task to go ahead and work ahead, self-affirm he'll appreciate it soon enough Emphasize rephrasing for self from "trying harder" to committed to just do ___ then see Contact TA for computer class PRN Work through [employer] I as intended, supplement with TA or [employer] as needed Social/morale: Continue exploring friend group Option to address Wilhemina Cash constructively [employer] job instead of fast food Endorse exploring possible dating Microbiologist with mother about tone, may include playful comparison to [employer] Ongoing Anxiety management -- Paced breathing prn, walk if restless.  Notice moments of motivational paralysis and treat them same as music mistakes -- "play through", a "note" at a time, or full freedom to emote (scream, internally, at least, or permission to say "I'm really uncomfortable" ...) while doing it.  Don't make it about "having" motivation, just find small, doable things to break inertia. Sleep and circadian management -- Blue light curfew or amber lenses.  Keep CPAP in good order and improve use.  Try using MCT oil as the only calories after food curfew, e.g., 10pm, and "keep real food for real daylight".  Work bedtimes and up times to fit school schedule in the next 3 weeks.   Overall health, wellbeing -- Seek more movement/exercise.  Look into "exercise snacks",  especially if they will elevate HR or get winded for a bit.  May do double duty as desensitization to panic. Food control -- resist night eating, redirect to other activities first, use MCT oil as a craving breaker, day or night Substances -- reduce/refrain nicotine, abstain from pot, keep alcohol moderate  Attention and time awareness -- try 1-minute "listenings" as mindfulness practice; option to practice estimating 1 minute elapsed without using a timepiece; use music or a shower clock to foster awareness of elapsed time in shower Academic recovery/progress -- Follow through with schedule and rectifying with work schedule.  Address paralysis/procrastination with self-reminding his purposes/goals, and breaking down task requirements until can take first step. Communication with parents -- Continue agreement to let good night be good night, without late interruptions or veiled late attempts to motivate or caution.  Cont assert as needed if feedback is guilting, catastrophizing, or "dogpiling".  Seek agreement with mother to be short and blunt rather than prolonged and tutorial about things she wants him to do better or try harder -- e.g., how he'd rather hear "Will you PLEASE take better care of my son?!" than go on about what he should be doing, how it just takes will power, not trying hard enough, etc.  Option to explain how strong cravings  are and how maybe mom doesn't actually know what they feel like, but F and B do.  Willing to frame it as "It is hard AND I am trying." Other recommendations/advice as may be noted above Continue to utilize previously learned skills ad lib Maintain medication as prescribed and work faithfully with relevant prescriber(s) if any changes are desired or seem indicated Call the clinic on-call service, 988/hotline, 911, or present to [facility] or ER if any life-threatening psychiatric crisis Return for as already scheduled. Already scheduled visit in this office  11/08/2021.  Blanchie Serve, PhD Luan Moore, PhD LP Clinical Psychologist, [facility] [address] [place], [place] [address] [phone]

## 2021-11-07 ENCOUNTER — Ambulatory Visit: Payer: 59 | Admitting: Psychiatry

## 2021-11-08 ENCOUNTER — Ambulatory Visit (INDEPENDENT_AMBULATORY_CARE_PROVIDER_SITE_OTHER): Payer: 59 | Admitting: Psychiatry

## 2021-11-08 DIAGNOSIS — F4010 Social phobia, unspecified: Secondary | ICD-10-CM

## 2021-11-08 DIAGNOSIS — F900 Attention-deficit hyperactivity disorder, predominantly inattentive type: Secondary | ICD-10-CM | POA: Diagnosis not present

## 2021-11-08 DIAGNOSIS — F411 Generalized anxiety disorder: Secondary | ICD-10-CM

## 2021-11-08 DIAGNOSIS — F3341 Major depressive disorder, recurrent, in partial remission: Secondary | ICD-10-CM

## 2021-11-08 NOTE — Progress Notes (Signed)
Psychotherapy Progress Note [facility] [name], PhD LP  Patient ID: Louis [name] [facility])    MRN: [id number] Therapy format: Individual psychotherapy Date: 11/08/2021      Start: 11:25a     Stop: 12:05p     Time Spent: 40 min Location: In-person   Session narrative (presenting needs, interim history, self-report of stressors and symptoms, applications of prior therapy, status changes, and interventions made in session) Has felt like he was "in a waiting period", kind of coasting, now coming back to himself.   Realization COVID and isolation basically stunted his growth, along with drug use, and just now coming back to himself.  Finding his interest in fashion returning.  Still 30-60 min of speed gaming per day, but realizes he immersed in it for a while as an escape.  May be "blooming" again post-isolation and post-drugs.  Renewing interest now in getting a fixer-upper car, something his brother and father have been into before.  Sights set on a 1998 BMW model near and dear to father's heart, will be going to see it with brother.  Affirmed interests coming back, noted clearly more responsive affect ("good to meet you"), and interpreted modern understanding that depression is tied to brain-based inflammation, for which some chemistry, some working out issues, helps relieve.  School still stressful, but making the connection that he can better face things that feel impossible in life for having seen himself overcome seemingly impossible things in game.  Sees coming back to school a help.  Making friends, liking being trusted with their stories.  Better student than he was at [employer] during pandemic.  Making use of academic advising.  Looking forward to more regulated, same-time classes next semester.    Overall, feels he's in a good place.  Sleep and night eating still at issue, but he started watching an old favorite show, Dragonball Z, before bed.  Mor focused, and more  pleasant, than the phone.  Setting phone out of reach so morning wake up is more decisive.  Night eating has fallen off.  Has not yet begun B or folate supplementation, but getting good results without using.  Per last time, doing better allocating time and effort early rather than procrastination.    Discussed outlook for therapy.  Can invite mother/father back in for a check in, though schedules can be difficult.  Reinforced positive tactics for "dogpiling" and "looping", from mother, especially just his ability to forgive and take it with a grain of salt, but also redirecting to the positive where needed, by asking her where she sees progress, possibly kidding her about using his grandmother's voice.  Therapeutic modalities: Cognitive Behavioral Therapy and Solution-Oriented/Positive Psychology  Mental Status/Observations:  Appearance:   Casual     Behavior:  Appropriate  Motor:  Normal  Speech/Language:   Clear and Coherent  Affect:  Substantially freer  Mood:  normal  Thought process:  normal  Thought content:    WNL  Sensory/Perceptual disturbances:    WNL  Orientation:  Fully oriented  Attention:  Good    Concentration:  Good  Memory:  WNL  Insight:    Good  Judgment:   Good  Impulse Control:  Fair   Risk Assessment: Danger to Self: No Self-injurious Behavior: No Danger to Others: No Physical Aggression / Violence: No Duty to Warn: No Access to Firearms a concern: No  Assessment of progress:  progressing  Diagnosis:   ICD-10-CM   1. Generalized anxiety disorder  F41.1  2. Attention deficit hyperactivity disorder (ADHD), predominantly inattentive type  F90.0     3. Major depressive disorder, recurrent, in partial remission ([place])  F33.41     4. Social anxiety disorder  F40.10      Plan:  Academic stress: Practice identifying task to go ahead with and work ahead, self-affirm he'll appreciate it soon enough vs. 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  Notice moments of motivational paralysis and treat them same as music mistakes -- "play through", a "note" at a time, or full freedom to emote (scream, internally, at least, or permission to say "I'm really uncomfortable" ...) while doing it.  Don't make it about "having" motivation, just find small, doable things to break inertia. Sleep and circadian management -- Blue light curfew or amber lenses.  Keep CPAP in good order and improve use. Option to use MCT oil as the only calories after food curfew (e.g., 10pm) and "keep real food for real daylight".  Resists staying up too late, accept end of the day. Overall health, wellbeing -- Seek more movement/exercise.  Look into "exercise snacks", especially if they will elevate HR or get winded for a bit.  May do double duty as desensitization to panic. Nutrition: Recommend add B complex to better provide brain-helpful nutrition in light of MTHFR issue.  Achieve steady use, and if insufficient, look into methylfolate with psychiatry. MVI probably also worthwhile given issues and some degraded nutrition; micronutrient supplementation may help stem night hunger as well Continue practice of resisting night eating at least 10 minutes and accepting going to bed a little hungry  Attention and time awareness -- try 1-minute "listenings" as mindfulness practice; option to practice estimating 1 minute elapsed without using a timepiece; use music or a shower clock to foster awareness of elapsed time in shower Academic recovery/progress -- Follow through with schedule and rectifying with work schedule.  Address paralysis/procrastination with self-reminding his purposes/goals, and breaking down task requirements until can take first step. Communication  with parents -- Continue agreement to let good night be good night, without late interruptions or veiled late attempts to motivate or caution.  Cont assert as needed if feedback is guilting, catastrophizing, "dogpiling", or "looping".  For pestering, either forgive and take with a grain of salt or seek agreements to be short and blunt rather than prolonged and tutorial, e.g., rather hear "Will you PLEASE take better care of my son?!" than go on about what he should be doing, or how it just takes will power, or not trying hard enough, etc.  Option to ask her to reconsider whether she knows how it feels dealing with cravings, as F and B do, but willing to frame it as "It is hard AND I'm trying." Social/morale: Continue exploring friend group Option to address Wilhemina Cash constructively [employer] job instead of fast food Endorse exploring possible dating Endorse working with the new car Substances -- reduce/refrain nicotine, abstain from pot, keep alcohol moderate  Other recommendations/advice as may be noted above Continue to utilize previously learned skills ad lib Maintain medication as prescribed and work faithfully with relevant prescriber(s) if any changes are desired or seem indicated Call the clinic on-call service, 988/hotline, 911, or present to [facility] or ER if any life-threatening psychiatric crisis  Return for as already scheduled. Already scheduled visit in this office 11/21/2021.  Robley Fries, PhD [name], PhD LP Clinical Psychologist, [facility] [address] [place], [place] [address] [phone]

## 2021-11-21 ENCOUNTER — Ambulatory Visit (INDEPENDENT_AMBULATORY_CARE_PROVIDER_SITE_OTHER): Payer: 59 | Admitting: Psychiatry

## 2021-11-21 DIAGNOSIS — F411 Generalized anxiety disorder: Secondary | ICD-10-CM

## 2021-11-21 DIAGNOSIS — F3341 Major depressive disorder, recurrent, in partial remission: Secondary | ICD-10-CM

## 2021-11-21 DIAGNOSIS — F4010 Social phobia, unspecified: Secondary | ICD-10-CM

## 2021-11-21 DIAGNOSIS — F900 Attention-deficit hyperactivity disorder, predominantly inattentive type: Secondary | ICD-10-CM

## 2021-11-21 DIAGNOSIS — G4730 Sleep apnea, unspecified: Secondary | ICD-10-CM

## 2021-11-21 NOTE — Progress Notes (Signed)
Psychotherapy Progress Note [facility] [name], PhD LP  Patient ID: Louis [name] [facility])    MRN: [id number] Therapy format: Individual psychotherapy Date: 11/21/2021      Start: 2:14p     Stop: 3:01p     Time Spent: 47 min Location: In-person   Session narrative (presenting needs, interim history, self-report of stressors and symptoms, applications of prior therapy, status changes, and interventions made in session) Hit some difficulty in a class, was able to redo some work.  Group presentation turning out problematic for other team members being unavailable.  Feels he can handle but annoying.  Starting to get disaffected by one of his instructors teaching "sloppy", in ways he fears will set him up to fail next semester.  Impulse to procrastinate comes and goes.  Sleep schedule varying some for grabbing naps.  Still not on methyfolate but willing to try it.  Night eating remains better, and CPAP data are good acc to him, but may still be having unnoticed deep sleep deprivation with naps and crashes late afternoon.    Has enjoyed working on his 2nd car of late, feels more desire to get out into "the real world" since getting it.  Still feeling more himself than he had been.  More conflict-free with mother.  Work continues.  Anticipates positive experience with holiday coming and manageable pressure with school work through semester's end.  Therapeutic modalities: Cognitive Behavioral Therapy and Solution-Oriented/Positive Psychology  Mental Status/Observations:  Appearance:   Casual     Behavior:  Appropriate  Motor:  Normal  Speech/Language:   Clear and Coherent  Affect:  Appropriate  Mood:  normal and less enthused  Thought process:  normal  Thought content:    WNL  Sensory/Perceptual disturbances:    WNL  Orientation:  Fully oriented  Attention:  Good    Concentration:  Good  Memory:  WNL  Insight:    Good  Judgment:   Good  Impulse Control:  Fair    Risk Assessment: Danger to Self: No Self-injurious Behavior: No Danger to Others: No Physical Aggression / Violence: No Duty to Warn: No Access to Firearms a concern: No  Assessment of progress:  stabilized  Diagnosis:   ICD-10-CM   1. Generalized anxiety disorder  F41.1     2. Attention deficit hyperactivity disorder (ADHD), predominantly inattentive type  F90.0     3. Major depressive disorder, recurrent, in partial remission ([facility])  F33.41     4. Social anxiety disorder  F40.10     5. Sleep apnea treated with continuous positive airway pressure (CPAP)  G47.30      Plan:  Academic stress: Practice identifying task to go ahead with and work ahead, self-affirm he'll appreciate it soon enough vs. 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 Make use of TAs and tutoring PRN Work through [employer] I as intended, supplement with TA or [employer] as needed Anxiety/shame management -- Paced breathing prn, walk if restless.  Notice moments of motivational paralysis and treat them same as music mistakes -- "play through", a "note" at a time, or full freedom to emote (scream, internally, at least, or permission to say "I'm really uncomfortable" ...) while doing it.  Don't make it about "having" motivation, just find small, doable things to break inertia. Sleep and circadian management -- Keep CPAP  in good order and improve use.  Blue light curfew or amber lenses recommended.  Option to use MCT oil as the only calories after food curfew (e.g., 10pm) and "keep real food for real daylight".  Resist staying up too late, accept end of the day. Overall health, wellbeing -- Seek more movement/exercise.  Look into "exercise snacks", especially if they will elevate HR or get winded for a bit.  May do double duty  as desensitization to panic. Nutrition: Recommend add B complex or methylfolate for better brain-helpful nutrition in light of MTHFR issue. MVI probably also worthwhile given issues and some degraded nutrition; micronutrient supplementation may help stem night hunger as well Continue practice of resisting night eating and doing something else at least 10 minutes, accepting going to bed a little hungry  Attention and time awareness tactics -- try 1-minute "listenings" as mindfulness practice; option to practice estimating 1 minute elapsed without using a timepiece; use music or a shower clock to foster awareness of elapsed time in shower Communication with parents -- Continue agreement to let good night be good night, without late interruptions or veiled late attempts to motivate or caution.  Cont assert as needed if feedback is guilting, catastrophizing, "dogpiling", or "looping".  For pestering, either forgive and take with a grain of salt or seek agreements to be short and blunt rather than prolonged and tutorial, e.g., rather hear "Will you PLEASE take better care of my son?!" than go on about what he should be doing, or how it just takes will power, or not trying hard enough, etc.  Option to ask her to reconsider whether she knows how it feels dealing with cravings, as F and B do, but willing to frame it as "It is hard AND I'm trying." Social/morale: Continue exploring friend group Option to address [name] constructively [employer] job instead of fast food Endorse exploring possible dating Endorse working with the new car Substances -- reduce/refrain nicotine, abstain from pot, keep alcohol moderate  Other recommendations/advice as may be noted above Continue to utilize previously learned skills ad lib Maintain medication as prescribed and work faithfully with relevant prescriber(s) if any changes are desired or seem indicated Call the clinic on-call service, 988/hotline, 911, or  present to [facility] or ER if any life-threatening psychiatric crisis Return for as already scheduled. Already scheduled visit in this office 12/06/2021.  Robley Fries, PhD [name], PhD LP Clinical Psychologist, [facility] [address] [place], [place] [address] [phone]

## 2021-12-06 ENCOUNTER — Ambulatory Visit (INDEPENDENT_AMBULATORY_CARE_PROVIDER_SITE_OTHER): Payer: 59 | Admitting: Psychiatry

## 2021-12-06 DIAGNOSIS — F3341 Major depressive disorder, recurrent, in partial remission: Secondary | ICD-10-CM | POA: Diagnosis not present

## 2021-12-06 DIAGNOSIS — F900 Attention-deficit hyperactivity disorder, predominantly inattentive type: Secondary | ICD-10-CM

## 2021-12-06 DIAGNOSIS — G4730 Sleep apnea, unspecified: Secondary | ICD-10-CM | POA: Diagnosis not present

## 2021-12-06 DIAGNOSIS — F411 Generalized anxiety disorder: Secondary | ICD-10-CM | POA: Diagnosis not present

## 2021-12-06 NOTE — Progress Notes (Signed)
Psychotherapy Progress Note [facility] Louis Czar, PhD LP  Patient ID: Louis [name] [facility])    MRN: [id number] Therapy format: Individual psychotherapy Date: 12/06/2021      Start: 1:20p     Stop: 2:05p     Time Spent: 45 min Location: In-person   Session narrative (presenting needs, interim history, self-report of stressors and symptoms, applications of prior therapy, status changes, and interventions made in session) Pt late today.  Going well, but sometimes gets afflicted with self-doubt, attrib to old perfectionism, may feel a bit too much like back at [facility].  Reaffirmed permission to be adjusting his efforts and gauging his standard for "good enough".  Expects very acceptable grades this semester, excited about next semester, which has a schedule that suits him better, not prone to mean doing two commutes.  Has felt good about being on campus more this semester.  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  Optimistic to get the job, and about having a reference when he needs it, and clearly different from the old anonymous, demotivated atmosphere at former school.  Enjoying the car, his first to own outright, and paying his own insurance now.  Helps offset the "rich shaming" he got in high school.  Looking for brother to get free to help do some work beyond his knowledge.  Has two games he speed runs -- [name] and [employer]  -- and reached a word record in the 2nd one after about 500 hours spent the past year and a half.  Less gaming as more car work, and neither one is blocking out schoolwork.  Feeling better on the whole about getting on with personal and life development, not preoccupied with being a late bloomer.    Torn about friendship with Louis [name], who continues to seem undermotivated, too content to drink and not learn games despite his professed interest in  gaming.  May have a project together soon that engages him better, hosting safe raves in [place].  Briefly discussed risks, conditions, and how important it needs to be to influence him.  Re. self-regulation, says sleep, night eating, and caring for his space at home all said to be going well.  Aware that mom gets picky when she's personally stressed, as she has been lately with a Veterinary surgeon role in a women's organization, but notably not transferring her stress the same, which may indicate some intentional effort on her part.  Option to thank her in some way or just let it play out naturally.  Therapeutic modalities: Cognitive Behavioral Therapy, Solution-Oriented/Positive Psychology, and Ego-Supportive  Mental Status/Observations:  Appearance:   Casual     Behavior:  Appropriate  Motor:  Normal  Speech/Language:   Clear and Coherent  Affect:  Appropriate  Mood:  normal and mildly dysthymic  Thought process:  normal  Thought content:    WNL  Sensory/Perceptual disturbances:    WNL  Orientation:  Fully oriented  Attention:  Good    Concentration:  Good  Memory:  WNL  Insight:    Good  Judgment:   Good  Impulse Control:  Good   Risk Assessment: Danger to Self: No Self-injurious Behavior: No Danger to Others: No Physical Aggression / Violence: No Duty to Warn: No Access to Firearms a concern: No  Assessment of progress:  progressing  Diagnosis:   ICD-10-CM   1. Attention deficit hyperactivity  disorder (ADHD), predominantly inattentive type  F90.0     2. Generalized anxiety disorder  F41.1     3. Major depressive disorder, recurrent, in partial remission (HCC)  F33.41     4. Sleep apnea treated with continuous positive airway pressure (CPAP)  G47.30      Plan:  Academic stress: Practice identifying task to go ahead with and work ahead, self-affirm he'll appreciate it soon enough vs. 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 Make use of TAs and tutoring PRN Work through [employer] I as intended, supplement with TA or [employer] as needed Anxiety/shame management -- Paced breathing prn, walk if restless.  Notice moments of motivational paralysis and treat them same as music mistakes -- "play through", a "note" at a time, or full freedom to emote (scream, internally, at least, or permission to say "I'm really uncomfortable" ...) while doing it.  Don't make it about "having" motivation, just find small, doable things to break inertia. Sleep and circadian management -- Keep CPAP in good order and improve use.  Blue light curfew or amber lenses recommended.  Option to use MCT oil as the only calories after food curfew (e.g., 10pm) and "keep real food for real daylight".  Resist staying up too late, accept end of the day. Overall health, wellbeing -- Seek more movement/exercise.  Look into "exercise snacks", especially if they will elevate HR or get winded for a bit.  May do double duty as desensitization to panic. Nutrition: Recommend add B complex or methylfolate for better brain-helpful nutrition in light of MTHFR issue. MVI probably also worthwhile given issues and some degraded nutrition; micronutrient supplementation may help stem night hunger as well Continue practice of resisting night eating and doing something else at least 10 minutes, accepting going to bed a little hungry  Attention and time awareness tactics -- try 1-minute "listenings" as mindfulness practice; option to practice estimating 1 minute elapsed without using a timepiece; use music or a shower clock to foster awareness of elapsed time in shower Communication with parents -- Continue agreement to let good night be good night, without late interruptions or veiled late attempts to motivate or caution.   Cont assert as needed if feedback is guilting, catastrophizing, "dogpiling", or "looping".  For pestering, either forgive and take with a grain of salt or seek agreements to be short and blunt rather than prolonged and tutorial, e.g., rather hear "Will you PLEASE take better care of my son?!" than go on about what he should be doing, or how it just takes will power, or not trying hard enough, etc.  Option to ask her to reconsider whether she knows how it feels dealing with cravings, as F and B do, but willing to frame it as "It is hard AND I'm trying."  Option to note and thank for detectable efforts restraining tendencies to project or transfer stress onto him. Social/morale: Continue exploring friend group Option to address Louis [name] constructively [employer] job instead of fast food Endorse exploring possible dating Endorse working with the new car Substances -- reduce/refrain nicotine, abstain from pot, keep alcohol moderate  Other recommendations/advice as may be noted above Continue to utilize previously learned skills ad lib Maintain medication as prescribed and work faithfully with relevant prescriber(s) if any changes are desired or seem indicated Call the clinic on-call  service, 988/hotline, 911, or present to [facility] or ER if any life-threatening psychiatric crisis Return for as already scheduled. Already scheduled visit in this office 12/19/2021.  Robley Fries, PhD Louis Czar, PhD LP Clinical Psychologist, [facility] [address], [address] [place], [place] [address] [phone]

## 2021-12-19 ENCOUNTER — Ambulatory Visit (INDEPENDENT_AMBULATORY_CARE_PROVIDER_SITE_OTHER): Payer: 59 | Admitting: Psychiatry

## 2021-12-19 DIAGNOSIS — F3341 Major depressive disorder, recurrent, in partial remission: Secondary | ICD-10-CM

## 2021-12-19 DIAGNOSIS — F900 Attention-deficit hyperactivity disorder, predominantly inattentive type: Secondary | ICD-10-CM | POA: Diagnosis not present

## 2021-12-19 DIAGNOSIS — G4730 Sleep apnea, unspecified: Secondary | ICD-10-CM | POA: Diagnosis not present

## 2021-12-19 DIAGNOSIS — F411 Generalized anxiety disorder: Secondary | ICD-10-CM

## 2021-12-19 DIAGNOSIS — F1910 Other psychoactive substance abuse, uncomplicated: Secondary | ICD-10-CM

## 2021-12-19 NOTE — Progress Notes (Signed)
Psychotherapy Progress Note [facility] 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  Already prepared for next semester except books.  December is "off" time, really, focusing on holiday tasks and recreation.  More bored with [employer], considering on- or near-campus jobs.  The old [employer] feeling is working through as he has worked through academic load and seen himself through a second semester of facing down problems, asking help when needed, and getting the work done.  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    Still concerned for friend [name], who seems to be wasting a lot of time and, to his irritation, creating an image of wasting himself and living the way Isaak came to be sick of himself, with substance abuse and not striving for anything.  Reevaluating whether to try to keep him as a friend, actually, or put the ball in his court to show interest/motivation in the friendship.  In a larger sense, feels a loss of trust in people he's known a long time.  Probed meanings to him, feels in some measure let down, having counted, maybe, more on them being companions or leaders for him as a late bloomer.  Reevaluating past experience with them, seeing as more empty than he used to believe.    In contrast, good time with brother at a charity car meet.  Another meet of interest coming up.  Finding some guys' community there, and while watching is interesting enough, he's  finding easily made friendships, too.  Admittedly, he is scoping out a men's community in case he never finds a good relationship.  Briefly discussed outlook and aspirations for dating, forming family of his own eventually.   Looking ahead, wants to keep schedule q 2-3 wks.  Next session wants to be able to show progress on his car.    Therapeutic modalities: Cognitive Behavioral Therapy, Solution-Oriented/Positive Psychology, and Ego-Supportive  Mental Status/Observations:  Appearance:   Casual     Behavior:  Appropriate  Motor:  Normal  Speech/Language:   Clear and Coherent  Affect:  Appropriate  Mood:  Normal  Thought process:  normal  Thought content:    WNL  Sensory/Perceptual disturbances:    WNL  Orientation:  Fully oriented  Attention:  Good    Concentration:  Good  Memory:  WNL  Insight:    Good  Judgment:   Good  Impulse Control:  Good   Risk Assessment: Danger to Self: No Self-injurious Behavior: No Danger to Others: No Physical Aggression / Violence: No Duty to Warn: No Access to Firearms a concern: No  Assessment of progress:  progressing  Diagnosis:   ICD-10-CM   1. Attention deficit hyperactivity disorder (ADHD), predominantly inattentive type  F90.0     2. Generalized anxiety disorder  F41.1     3. Major depressive disorder, recurrent, in partial remission (HCC)  F33.41     4. Sleep apnea treated with continuous positive airway pressure (CPAP)  G47.30  5. Polysubstance abuse (HCC) - improved  F19.10      Plan:  Academic stress management: Anti-procrastination -- self-remind of his purposes/goals, break down task requirements until he can willing take first step, and if unwilling to put in the work right now, be explicit with self about it Practice identifying tasks to go ahead with and work ahead, self-affirm he'll appreciate it soon enough vs. piling up work Biomedical scientist self-motivation -- not "trying harder" but "just do ___ then see how  it goes".  Don't make it about "having" motivation but about applying the motivation he has for small, doable things to break inertia. Make use of TAs and tutoring PRN Where needed and available, supplement with [name] or other available self-instruction Anxiety/shame management -- Paced breathing prn, walk if restless.  Notice moments of motivational paralysis and treat them same as music mistakes -- "play through", a "note" at a time, with full freedom to acknowledge and emote (e.g., scream internally, permission to say "I'm really uncomfortable" ...) while doing it.   Sleep and circadian management -- Resist staying up too late, accept end of the day.  Keep CPAP in good order and improve use.  Blue light curfew or amber lenses recommended.  Option to use MCT oil as the only calories after food curfew (e.g., 10pm) and "keep real food for real daylight".   Overall health, wellbeing -- Seek more movement/exercise.  Look into "exercise snacks", especially if they will elevate HR or get winded for a bit.  May do double duty as desensitization to panic. Nutrition: Recommend add B complex supplement or methylfolate to improve brain-helpful nutrition, offset MTHFR issue MVI probably also worthwhile given issues and some degraded nutrition; micronutrient supplementation may help stem night hunger as well Continue practice of resisting night eating and doing something else at least 10 minutes, accepting going to bed a little hungry  Attention and time awareness tactics -- try 1-minute "listenings" as mindfulness practice; option to practice estimating 1 minute elapsed without using a timepiece; use music or a shower clock to foster awareness of elapsed time in shower Communication with parents -- Continue agreement to let good night be good night, without late interruptions or veiled late attempts to motivate or caution.  Cont assert as needed if feedback is guilting, catastrophizing, "dogpiling", or  "looping".  For pestering, either forgive and take with a grain of salt or seek agreements to be short and blunt rather than prolonged and tutorial, e.g., rather hear "Will you PLEASE take better care of my son?!" than go on about what he should be doing, or how it just takes will power, or not trying hard enough, etc.  Option to ask her to reconsider whether she knows how it feels dealing with cravings, as F and B do, but willing to frame it as "It is hard AND I'm trying."  Option to note and thank for detectable efforts restraining tendencies to project or transfer stress onto him. Social/morale: Continue exploring friend group Option to address [name] constructively [employer] job instead of fast food Endorse exploring possible dating Endorse working with the new car and exploring community of interest Come back to dating at discretion Substances -- reduce/refrain nicotine, abstain from pot, keep alcohol moderate  Other recommendations/advice as may be noted above Continue to utilize previously learned skills ad lib Maintain medication as prescribed and work faithfully with relevant prescriber(s) if any changes are desired or seem indicated Call the clinic on-call service, 988/hotline, 911,  or present to [facility] or ER if any life-threatening psychiatric crisis Return for as already scheduled. Already scheduled visit in this office 01/09/2022.  Robley Fries, PhD [name], PhD LP Clinical Psychologist, [facility] [address] [place], [place] [address] [phone]

## 2021-12-20 ENCOUNTER — Ambulatory Visit: Payer: 59 | Admitting: Psychiatry

## 2021-12-24 ENCOUNTER — Telehealth: Payer: Self-pay | Admitting: Adult Health

## 2021-12-24 ENCOUNTER — Other Ambulatory Visit: Payer: Self-pay

## 2021-12-24 DIAGNOSIS — F900 Attention-deficit hyperactivity disorder, predominantly inattentive type: Secondary | ICD-10-CM

## 2021-12-24 MED ORDER — METHYLPHENIDATE HCL ER (OSM) 36 MG PO TBCR: 36.0000 mg | EXTENDED_RELEASE_TABLET | Freq: Every day | ORAL | 0 refills | Status: DC

## 2021-12-24 NOTE — Telephone Encounter (Signed)
Pended to dr cottle 

## 2021-12-24 NOTE — Telephone Encounter (Signed)
Nayel called at 1:27 to request refill of his Concerta.  Appt 1/17.  Send to [facility]

## 2021-12-26 ENCOUNTER — Other Ambulatory Visit: Payer: Self-pay

## 2021-12-26 ENCOUNTER — Telehealth: Payer: Self-pay | Admitting: Adult Health

## 2021-12-26 DIAGNOSIS — F900 Attention-deficit hyperactivity disorder, predominantly inattentive type: Secondary | ICD-10-CM

## 2021-12-26 MED ORDER — METHYLPHENIDATE HCL ER (OSM) 36 MG PO TBCR: 36.0000 mg | EXTENDED_RELEASE_TABLET | Freq: Every day | ORAL | 0 refills | Status: DC

## 2021-12-26 NOTE — Telephone Encounter (Signed)
Pended.

## 2021-12-26 NOTE — Telephone Encounter (Signed)
Please send Rx for generic Concerta 36 mg to [facility] [address] [place] [facility] out of stock.

## 2022-01-09 ENCOUNTER — Ambulatory Visit (INDEPENDENT_AMBULATORY_CARE_PROVIDER_SITE_OTHER): Payer: 59 | Admitting: Psychiatry

## 2022-01-09 DIAGNOSIS — F411 Generalized anxiety disorder: Secondary | ICD-10-CM

## 2022-01-09 DIAGNOSIS — G4730 Sleep apnea, unspecified: Secondary | ICD-10-CM | POA: Diagnosis not present

## 2022-01-09 DIAGNOSIS — F900 Attention-deficit hyperactivity disorder, predominantly inattentive type: Secondary | ICD-10-CM

## 2022-01-09 DIAGNOSIS — F1910 Other psychoactive substance abuse, uncomplicated: Secondary | ICD-10-CM

## 2022-01-09 DIAGNOSIS — F3341 Major depressive disorder, recurrent, in partial remission: Secondary | ICD-10-CM | POA: Diagnosis not present

## 2022-01-09 NOTE — Progress Notes (Signed)
Psychotherapy Progress Note [facility] Luan Moore, PhD LP  Patient ID: Louis [name] [facility])    MRN: [id number] Therapy format: Individual psychotherapy Date: 01/09/2022      Start: 9:15a     Stop: 10:00a     Time Spent: 45 min Location: In-person   Session narrative (presenting needs, interim history, self-report of stressors and symptoms, applications of prior therapy, status changes, and interventions made in session) "Pretty good overall, I think."  Family holiday in [place], [place] on the trip, [place] in [place], really on vacation 12/23 to yesterday.  Vacation included golf and biking, and notably less worry about getting bored, trapped, or homesick.    Still handling preparations for the semester starting 1/8.  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  Schedule is busier, figures it to remain more stimulating, less apt to fritter away time with unstructured days.  Set up to have single-commute days, with things worth doing on campus.  Will be working an extra day at [employer] to improve money, build savings.   Has nearly run down an old RX of Klonopin (0.5mg , formerly up to BID PRN, typically only using now for anticipatory anxiety for events now max 3/mo, now 10 remaining).  Agreed he is clearly not overusing it, just needs to see through the logistics of renewing scrip with Dr. Dwaine Gale.  Alcohol is occasional, not self-medicating.  Marijuana still episodic, variable, with purpose being to get stoned safely.  Still no bong, and he has to make a device if he wants to use now.  Largely established better self-control for eliminating easy access, though he does still see it as taking more than it's giving and possibly confusing his insight about motivation and depression/anhedonia.  Also clear that gaming high is gaming impaired when he's trying to excel.  Sees "hermit tendencies" going back a ways, entrenched with pandemic.  Does see  good ego strength now to resist isolating, more than he has with marijuana habit as it was.  Considering further whether to quit it entirely.   Probed priorities -- wants most to be able to clear his mind when needed, get into a flow state.  Frustrated with ADHD intruding thoughts.  Discussed forms of meditation and neurological calming, like sensory inventory, quit minute, and distance vision (20+ sec @ 20+ feet away).    Therapeutic modalities: Cognitive Behavioral Therapy, Solution-Oriented/Positive Psychology, and Ego-Supportive  Mental Status/Observations:  Appearance:   Casual     Behavior:  Appropriate  Motor:  Normal  Speech/Language:   Clear and Coherent  Affect:  Appropriate  Mood:  normal and brighter  Thought process:  normal  Thought content:    WNL  Sensory/Perceptual disturbances:    WNL  Orientation:  Fully oriented  Attention:  Good    Concentration:  Good  Memory:  WNL  Insight:    Good  Judgment:   Good  Impulse Control:  Fair-good   Risk Assessment: Danger to Self: No Self-injurious Behavior: No Danger to Others: No Physical Aggression / Violence: No Duty to Warn: No Access to Firearms a concern: No  Assessment of progress:  progressing  Diagnosis:   ICD-10-CM   1. Attention deficit hyperactivity disorder (ADHD), predominantly inattentive type  F90.0     2. Generalized anxiety disorder  F41.1     3. Major depressive disorder, recurrent, in partial remission ([place])  F33.41     4. Sleep apnea treated with continuous  positive airway pressure (CPAP)  G47.30     5. Polysubstance abuse (HCC) - improved  F19.10      Plan:  Focal goal of establishing 2/day mini meditations of choice -- sensory mindfulness, distance vision, just quiet and acknowledge Academic stress management: Anti-procrastination -- self-remind of his purposes/goals, break down task requirements until he can willing take first step, and if unwilling to put in the work right now, be  explicit with self about it Practice identifying tasks to go ahead with and work ahead, self-affirm he'll appreciate it soon enough vs. piling up work Biomedical scientist self-motivation -- not "trying harder" but "just do ___ then see how it goes".  Don't make it about "having" motivation but about applying the motivation he has for small, doable things to break inertia. Make use of TAs and tutoring PRN Where needed and available, supplement with [name] or other available self-instruction Anxiety/shame management -- Paced breathing prn, walk if restless.  Notice moments of motivational paralysis and treat them same as music mistakes -- "play through", a "note" at a time, with full freedom to acknowledge and emote (e.g., scream internally, permission to say "I'm really uncomfortable" ...) while doing it.   Sleep and circadian management -- Resist staying up too late, accept end of the day.  Keep CPAP in good order and improve use.  Blue light curfew or amber lenses recommended.  Option to use MCT oil as the only calories after food curfew (e.g., 10pm) and "keep real food for real daylight".   Overall health, wellbeing -- Seek more movement/exercise.  Look into "exercise snacks", especially if they will elevate HR or get winded for a bit.  May do double duty as desensitization to panic. Nutrition: Recommend add B complex supplement or methylfolate to improve brain-helpful nutrition, offset MTHFR issue MVI probably also worthwhile given issues and some degraded nutrition; micronutrient supplementation may help stem night hunger as well Continue practice of resisting night eating and doing something else at least 10 minutes, accepting going to bed a little hungry  Attention and time awareness tactics -- try 1-minute "listenings" as mindfulness practice; option to practice estimating 1 minute elapsed without using a timepiece; use music or a shower clock to foster awareness of elapsed time in  shower Communication with parents -- Continue agreement to let good night be good night, without late interruptions or veiled late attempts to motivate or caution.  Cont assert as needed if feedback is guilting, catastrophizing, "dogpiling", or "looping".  For pestering, either forgive and take with a grain of salt or seek agreements to be short and blunt rather than prolonged and tutorial, e.g., rather hear "Will you PLEASE take better care of my son?!" than go on about what he should be doing, or how it just takes will power, or not trying hard enough, etc.  Option to ask her to reconsider whether she knows how it feels dealing with cravings, as F and B do, but willing to frame it as "It is hard AND I'm trying."  Option to note and thank for detectable efforts restraining tendencies to project or transfer stress onto him. Social/morale: Continue exploring friend group Option to address [name] constructively [employer] job instead of fast food Endorse exploring possible dating Endorse working with the new car and exploring community of interest Come back to dating at discretion Substances -- reduce/refrain nicotine, recommend abstain from pot, keep alcohol moderate  Other recommendations/advice as may be noted above Continue to utilize previously  learned skills ad lib Maintain medication as prescribed and work faithfully with relevant prescriber(s) if any changes are desired or seem indicated Call the clinic on-call service, 988/hotline, 911, or present to [facility] or ER if any life-threatening psychiatric crisis Return for as already scheduled. Already scheduled visit in this office 01/23/2022.  Blanchie Serve, PhD Luan Moore, PhD LP Clinical Psychologist, [facility] [address], [place], [place] [address] [phone]

## 2022-01-14 ENCOUNTER — Ambulatory Visit: Payer: 59 | Admitting: Neurology

## 2022-01-23 ENCOUNTER — Ambulatory Visit: Payer: 59 | Admitting: Adult Health

## 2022-01-23 ENCOUNTER — Ambulatory Visit (INDEPENDENT_AMBULATORY_CARE_PROVIDER_SITE_OTHER): Payer: 59 | Admitting: Psychiatry

## 2022-01-23 DIAGNOSIS — F3341 Major depressive disorder, recurrent, in partial remission: Secondary | ICD-10-CM

## 2022-01-23 DIAGNOSIS — F900 Attention-deficit hyperactivity disorder, predominantly inattentive type: Secondary | ICD-10-CM | POA: Diagnosis not present

## 2022-01-23 DIAGNOSIS — F509 Eating disorder, unspecified: Secondary | ICD-10-CM

## 2022-01-23 DIAGNOSIS — F411 Generalized anxiety disorder: Secondary | ICD-10-CM

## 2022-01-23 DIAGNOSIS — G4730 Sleep apnea, unspecified: Secondary | ICD-10-CM | POA: Diagnosis not present

## 2022-01-23 DIAGNOSIS — F1910 Other psychoactive substance abuse, uncomplicated: Secondary | ICD-10-CM

## 2022-01-23 NOTE — Progress Notes (Signed)
Psychotherapy Progress Note [facility] [name], PhD LP  Patient ID: Louis [name] [facility])    MRN: [id number] Therapy format: Individual psychotherapy Date: 01/23/2022      Start: 3:17p     Stop: 4:05p     Time Spent: 48 min Location: Telehealth visit -- I connected with this patient by an approved telecommunication method (video), with his informed consent, and verifying identity and patient privacy.  I was located at my office and patient at his home.  As needed, we discussed the limitations, risks, and security and privacy concerns associated with telehealth service, including the availability and conditions which currently govern in-person appointments and the possibility that 3rd-party payment may not be fully guaranteed and he may be responsible for charges.  After he indicated understanding, we proceeded with the session.  Also discussed treatment planning, as needed, including ongoing verbal agreement with the plan, the opportunity to ask and answer all questions, his demonstrated understanding of instructions, and his readiness to call the office should symptoms worsen or he feels he is in a crisis state and needs more immediate and tangible assistance.   Session narrative (presenting needs, interim history, self-report of stressors and symptoms, applications of prior therapy, status changes, and interventions made in session) On video today to economize on time.  Is actively in classes, working his on campus job, feeling more pressure to keep up, definitely less idle time.  Re. Schoolwork, is comprehending material, not falling behind, just acclimating to the intensified pace.  Sleeping sufficiently, definitely less eating at night.  Swapped grab foods from tortilla chips to Cheerios, figuring it's healthier.  Enjoys playing fighting games online, but finds he can still feel anxious/restless getting started with them.  Interpreted conditioned fear of emotional or  neurological experience, not actual social anxiety or performance pressure, and the need would be to "change channels" -- pull away from electronics, tend something else, or take a minute to not have to "be there" yet, anything that gives his nerves a break, especially from so much prolonged screen time.    F/u to last time, did work out the [employer] prescription.  "Hermit tendencies" not at issue.  Inconsistently practicing de-escalation tactics from last time, like distance vision, quiet minute.  Has not made it to doing them 2/day.  Encouraged again to seek making a 2/day habit.  Repetitive worry about calculus in particular.  Did pass last semester's exam, and knows he learned some, but it's hard to make it stick as relevant and current enough to build on.  Does feel more up to the subject this semester and better able to trust he'll get through and pass satisfactorily.  Current [employer] GPA is 3.7, and would be content with a C in Calc.  No other worry classes on the plan this semester.  No worries about the new job, figures it more to be a lift, a good change of pace, and itself prone too feel better.  Will be keeping the [employer] job for now, but open to expanding role in academic support if invited.    Therapeutic modalities: Cognitive Behavioral Therapy and Solution-Oriented/Positive Psychology  Mental Status/Observations:  Appearance:   Casual     Behavior:  Appropriate  Motor:  Normal  Speech/Language:   Clear and Coherent  Affect:  Appropriate  Mood:  anxious  Thought process:  normal  Thought content:    WNL  Sensory/Perceptual disturbances:    WNL  Orientation:  Fully oriented  Attention:  Good    Concentration:  Good  Memory:  WNL  Insight:    Good  Judgment:   Good  Impulse Control:  Fair   Risk Assessment: Danger to Self: No Self-injurious Behavior: No Danger to Others: No Physical Aggression / Violence: No Duty to Warn: No Access to Firearms a concern: No  Assessment  of progress:  stabilized  Diagnosis:   ICD-10-CM   1. Generalized anxiety disorder  F41.1     2. Attention deficit hyperactivity disorder (ADHD), predominantly inattentive type  F90.0     3. Major depressive disorder, recurrent, in partial remission ([place])  F33.41     4. Sleep apnea treated with continuous positive airway pressure (CPAP)  G47.30     5. Polysubstance abuse ([place]) - improved  F19.10     6. Compulsive eating patterns (r/o carb addiction)  F50.9    improving     Plan:  Still focal goal of finding 2/day for mini-meditations or slowdowns to recondition ANS arousal contributing to anxiety and ADHD Academic stress management: Anti-procrastination -- self-remind of his purposes/goals, break down task requirements until he can willing take first step, and if unwilling to put in the work right now, be explicit with self about it Practice identifying tasks to go ahead with and work ahead, self-affirm he'll appreciate it soon enough vs. piling up work Film/video editor self-motivation -- not "trying harder" but "just do ___ then see how it goes".  Don't make it about "having" motivation but about applying the motivation he has for small, doable things to break inertia. Make use of TAs and tutoring PRN Where needed and available, supplement with [place] or other available self-instruction Anxiety/shame management -- Paced breathing prn, walk if restless.  Notice moments of motivational paralysis and treat them same as music mistakes -- "play through", a "note" at a time, with full freedom to acknowledge and emote (e.g., scream internally, permission to say "I'm really uncomfortable" ...) while doing it.   Sleep and circadian management -- Resist staying up too late, accept end of the day.  Keep CPAP in good order and improve use.  Blue light curfew or amber lenses recommended.   Overall health, wellbeing -- Seek more movement/exercise.  Look into "exercise snacks", especially if  they will elevate HR or get winded for a bit.  May do double duty as desensitization to panic.  Option to use MCT oil as the only calories after food curfew (e.g., 10pm) and "keep real food for real daylight".  Continue driving down night snacking. Nutrition: Recommend add B complex supplement or methylfolate to improve brain-helpful nutrition, offset MTHFR issue MVI probably also worthwhile given issues and some degraded nutrition; micronutrient supplementation may help stem night hunger as well Continue practice of resisting night eating and doing something else at least 10 minutes, accepting going to bed a little hungry  Attention and time awareness tactics -- try 1-minute "listenings" as mindfulness practice; option to practice estimating 1 minute elapsed without using a timepiece; use music or a shower clock to foster awareness of elapsed time in shower Communication with parents -- Continue agreement to let good night be good night, without late interruptions or veiled late attempts to motivate or caution.  Cont assert as needed if feedback is guilting, catastrophizing, "dogpiling", or "looping".  For pestering, either forgive and take with a grain of salt or seek agreements to be short and blunt rather than prolonged and tutorial, e.g., rather hear "Will you PLEASE take better  care of my son?!" than go on about what he should be doing, or how it just takes will power, or not trying hard enough, etc.  Option to ask her to reconsider whether she knows how it feels dealing with cravings, as F and B do, but willing to frame it as "It is hard AND I'm trying."  Option to note and thank for detectable efforts restraining tendencies to project or transfer stress onto him. Social/morale: Continue exploring friend group Option to address Wilhemina Cash constructively [employer] job instead of fast food Endorse exploring possible dating Endorse working with the new car and exploring community of  interest Come back to dating at discretion Substances -- reduce/refrain nicotine, recommend abstain from pot, keep alcohol moderate  Other recommendations/advice as may be noted above Continue to utilize previously learned skills ad lib Maintain medication as prescribed and work faithfully with relevant prescriber(s) if any changes are desired or seem indicated Call the clinic on-call service, 988/hotline, 911, or present to [facility] or ER if any life-threatening psychiatric crisis Return for as already scheduled. Already scheduled visit in this office 01/24/2022.  Blanchie Serve, PhD Luan Moore, PhD LP Clinical Psychologist, [facility] [address] [place], [place] [address] [phone]

## 2022-01-24 ENCOUNTER — Ambulatory Visit (INDEPENDENT_AMBULATORY_CARE_PROVIDER_SITE_OTHER): Payer: 59 | Admitting: Adult Health

## 2022-01-24 ENCOUNTER — Encounter: Payer: Self-pay | Admitting: Adult Health

## 2022-01-24 DIAGNOSIS — F900 Attention-deficit hyperactivity disorder, predominantly inattentive type: Secondary | ICD-10-CM | POA: Diagnosis not present

## 2022-01-24 DIAGNOSIS — F3341 Major depressive disorder, recurrent, in partial remission: Secondary | ICD-10-CM | POA: Diagnosis not present

## 2022-01-24 DIAGNOSIS — F1910 Other psychoactive substance abuse, uncomplicated: Secondary | ICD-10-CM

## 2022-01-24 DIAGNOSIS — F411 Generalized anxiety disorder: Secondary | ICD-10-CM | POA: Diagnosis not present

## 2022-01-24 MED ORDER — METHYLPHENIDATE HCL ER (OSM) 36 MG PO TBCR: 36.0000 mg | EXTENDED_RELEASE_TABLET | Freq: Every day | ORAL | 0 refills | Status: DC

## 2022-01-24 NOTE — Progress Notes (Signed)
Louis [name] [id number] [birth year] 27 y.o.  Subjective:   Patient ID:  Louis [name] is a 27 y.o. (DOB [birth year]) male.  Chief Complaint: No chief complaint on file.   HPI Louis [name] presents to the office today for follow-up of MDD, ADHD, GAD, polysubstance abuse.  Describes mood today as "ok". Pleasant. Flat. Mood symptoms - reports some depression - "anhedonia. Reports anxiety - "comes and goes" - "more manageable". Reports some irritability - "able to control it".  Denies recent panic attacks. Mood is consistent. Stating "I'm doing pretty goof". Feels like medications are helpful. Living at home with parents. Stable interest and motivation. Taking medications as prescribed.  Energy levels stable. Active, has a regular exercise routine. Enjoys some usual interests and activities. Single. Not dating. Lives with parents and cat. Spending time with family and friends. Appetite adequate. Weight stable - 215 to 220 pounds. Sleeps well most nights. Averages 6 to 8 hours. Focus and concentration stable. Completing tasks. Managing aspects of household. Taking 4 classes - [employer]. 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 Working part at [employer]. Denies SI or HI.  Denies AH or VH. Substance use - vapes, but trying to taper off History of THC - a few times a month. ETOH use - a few times a month.   GAD-7    Flowsheet Row Counselor from 03/16/2021 in [facility]  Total GAD-7 Score 14      PHQ2-9    Flowsheet Row Counselor from 03/16/2021 in [place] from 05/03/2020 in [facility]  PHQ-2 Total Score 5 5  PHQ-9 Total Score 21 20        Review of Systems:  Review of Systems  Musculoskeletal:  Negative for gait problem.  Neurological:  Negative for tremors.  Psychiatric/Behavioral:         Please refer to HPI    Medications: I have reviewed the patient's current medications.  Current Outpatient  Medications  Medication Sig Dispense Refill   escitalopram (LEXAPRO) 20 MG tablet Take 1 tablet (20 mg total) by mouth daily. 30 tablet 5   methylphenidate (CONCERTA) 36 MG PO CR tablet Take 1 tablet (36 mg total) by mouth daily. 30 tablet 0   [START ON 02/21/2022] methylphenidate (CONCERTA) 36 MG PO CR tablet Take 1 tablet (36 mg total) by mouth daily. 30 tablet 0   [START ON 03/21/2022] methylphenidate (CONCERTA) 36 MG PO CR tablet Take 1 tablet (36 mg total) by mouth daily. 30 tablet 0   traZODone (DESYREL) 100 MG tablet Take one tablet daily. 30 tablet 5   No current facility-administered medications for this visit.    Medication Side Effects: None  Allergies: No Known Allergies  Past Medical History:  Diagnosis Date   ADHD    Anxiety and depression     Past Medical History, Surgical history, Social history, and Family history were reviewed and updated as appropriate.   Please see review of systems for further details on the patient's review from today.   Objective:   Physical Exam:  There were no vitals taken for this visit.  Physical Exam Constitutional:      General: He is not in acute distress. Musculoskeletal:        General: No deformity.  Neurological:     Mental Status: He is alert and oriented to person, place, and time.     Coordination: Coordination normal.  Psychiatric:        Attention and Perception:  Attention and perception normal. He does not perceive auditory or visual hallucinations.        Mood and Affect: Mood normal. Mood is not anxious or depressed. Affect is not labile, blunt, angry or inappropriate.        Speech: Speech normal.        Behavior: Behavior normal.        Thought Content: Thought content normal. Thought content is not paranoid or delusional. Thought content does not include homicidal or suicidal ideation. Thought content does not include homicidal or suicidal plan.        Cognition and Memory: Cognition and memory normal.         Judgment: Judgment normal.     Comments: Insight intact     Lab Review:  No results found for: "NA", "K", "CL", "CO2", "GLUCOSE", "BUN", "CREATININE", "CALCIUM", "PROT", "ALBUMIN", "AST", "ALT", "ALKPHOS", "BILITOT", "GFRNONAA", "GFRAA"  No results found for: "WBC", "RBC", "HGB", "HCT", "PLT", "MCV", "MCH", "MCHC", "RDW", "LYMPHSABS", "MONOABS", "EOSABS", "BASOSABS"  No results found for: "POCLITH", "LITHIUM"   No results found for: "PHENYTOIN", "PHENOBARB", "VALPROATE", "CBMZ"   .res Assessment: Plan:    Plan:  PDMP reviewed  Continue Lexapro 20mg  daily  Trazadone 100mg  - one tab at hs Continue Concerta 36mg  daily  Using Clonazepam infrequently - has 10 pills remaining - none needed today.  BP - 138/75/74  Working with Dr Elroy Channel testing available.  RTC 3 months  Patient advised to contact office with any questions, adverse effects, or acute worsening in signs and symptoms.  Diagnoses and all orders for this visit:  Major depressive disorder, recurrent, in partial remission (HCC)  Attention deficit hyperactivity disorder (ADHD), predominantly inattentive type -     methylphenidate (CONCERTA) 36 MG PO CR tablet; Take 1 tablet (36 mg total) by mouth daily. -     methylphenidate (CONCERTA) 36 MG PO CR tablet; Take 1 tablet (36 mg total) by mouth daily. -     methylphenidate (CONCERTA) 36 MG PO CR tablet; Take 1 tablet (36 mg total) by mouth daily.  Generalized anxiety disorder  Polysubstance abuse ([place]) - improved     Please see After Visit Summary for patient specific instructions.  Future Appointments  Date Time Provider [place]  02/06/2022  3:00 PM Blanchie Serve, PhD CP-CP None  02/07/2022  1:30 PM Dohmeier, Asencion Partridge, MD [facility] None  02/21/2022  2:00 PM Blanchie Serve, PhD CP-CP None  03/07/2022  2:00 PM Blanchie Serve, PhD CP-CP None  03/21/2022  2:00 PM Blanchie Serve, PhD CP-CP None  04/04/2022  2:00 PM Mitchum, Herbie [place], PhD CP-CP None     No orders of the defined types were placed in this encounter.   -------------------------------

## 2022-02-06 ENCOUNTER — Ambulatory Visit (INDEPENDENT_AMBULATORY_CARE_PROVIDER_SITE_OTHER): Payer: 59 | Admitting: Psychiatry

## 2022-02-06 DIAGNOSIS — G4721 Circadian rhythm sleep disorder, delayed sleep phase type: Secondary | ICD-10-CM | POA: Diagnosis not present

## 2022-02-06 DIAGNOSIS — F3341 Major depressive disorder, recurrent, in partial remission: Secondary | ICD-10-CM | POA: Diagnosis not present

## 2022-02-06 DIAGNOSIS — F411 Generalized anxiety disorder: Secondary | ICD-10-CM | POA: Diagnosis not present

## 2022-02-06 DIAGNOSIS — F1910 Other psychoactive substance abuse, uncomplicated: Secondary | ICD-10-CM

## 2022-02-06 DIAGNOSIS — F900 Attention-deficit hyperactivity disorder, predominantly inattentive type: Secondary | ICD-10-CM | POA: Diagnosis not present

## 2022-02-06 NOTE — Progress Notes (Signed)
Psychotherapy Progress Note [facility] Luan Moore, PhD LP  Patient ID: Louis [name] [facility])    MRN: [id number] Therapy format: Individual psychotherapy Date: 02/06/2022      Start: 3:13p     Stop: 3:57p     Time Spent: 44 min Location: In-person   Session narrative (presenting needs, interim history, self-report of stressors and symptoms, applications of prior therapy, status changes, and interventions made in session) School in session.  Some worry with parents leaving on vacation tomorrow.  Finds he counts on them some to get him out of slumps, or more properly, grounded when he might otherwise get into an understimulated, hypnotic state.  Discussed arranging time with friends to have something social and grounding should he feel at risk for isolating too much and going morose.  Otherwise, been largely succeeding at scheduling -- more limited time for speed gaming, which makes it more rewarding, and evening, after the day's work is done.  Still may push bedtime to 1am, but really only on weekends.  Cut back from 4 days to 3 days at [employer] [employer], to make sure he has enough time to complete assignments.  Frees up Friday evening, which can make for big free time but also leaves him available crunch time Louis [name] assignments that tend to be all due Friday night.  Calculus II is the intimidating class, 1st exam coming up.  Refreshed willingness to get tutoring time, and suggested he could ask about an exam review this weekend, either "DIY" among classmates, or organized with a TA.  Side issue there is an attractive girl in his class he could ask to study with, but afraid he be seen as gaming her.  Validated that it could seem loaded, but if he wants to explore that, too, he can, just be honest, or table it until after he gets study time another way.  Does notice when he misses meds that he can miss some focus, but also wonders if, like happened with reducing pot, it's clarifying to be more  off of them.  Cautioned against drawing too bug a comparison between [facility] and his meds, and against drawing conclusions from missing two meds in the same day.  If it's an important question now to find out how he works off something, can organize that with psychiatry or at least make it an organized experiment, meaning fully regular before trying to subtract something, then best start with taking a week off the stimulant, switch back, see what the effect is.  Separately, he could take 2-4 weeks off the antidepressant to see what the effect is.  Better, though, to get further through a stress season while meds stay consistent.  Physiologically, bedtime still migrates some, mostly a later weekend up to 1am and getting up 11:30am.  Inconsistent use of orange lenses, but hasn't felt a strong difference in sleep-readiness.  Food curfew is holding up pretty well, and has shed a couple pounds.  More regular about brushing teeth after making the effort and getting guidance from his dental office.  Realized he didn't exactly know how to brush properly, nor know what kind of consequences there can be from brushing neglect.  Affirmed better self-care, and before that being brave enough to ask so he could get guidance.  Overall, can worry whether he has "too much or too little stress".  Assured it's in range, and he is working with it, let it be an ongoing learning experience, not a whole judgment to make right now.  Clearly making adjustments and clearly free to make others, whether it's tutoring, trimming work further, or something else.  Important to give himself as much physiological regularity as possible before trying to judge meds, commitments, his own ego strength, or anything else, so encouragement to get as grounded as possible in predictable bedtimes, up times, food times, med times.  Also worth figuring that 3 semesters in, getting back into more challenging material, and juggling more commitments is bound to be  both a stronger test of whether he can handle things independently and closer to the original conditions when he broke down at [facility].  Good news being, he gets to find out what's well put together and appropriate after trying this out, so be sure not to let worries pretend to be prophecies, just go with it and use the tools he has in time planning and asking for study help when he sees the need.  Therapeutic modalities: Cognitive Behavioral Therapy, Solution-Oriented/Positive Psychology, Ego-Supportive, and Assertiveness/Communication  Mental Status/Observations:  Appearance:   Casual     Behavior:  Appropriate  Motor:  Normal and bouncing leg  Speech/Language:   Clear and Coherent and usual subdued tone   Affect:  Appropriate, constricted  Mood:  anxious  Thought process:  normal  Thought content:    WNL  Sensory/Perceptual disturbances:    WNL  Orientation:  Fully oriented  Attention:  Good    Concentration:  Good  Memory:  WNL  Insight:    Good  Judgment:   Good  Impulse Control:  Fair   Risk Assessment: Danger to Self: No Self-injurious Behavior: No Danger to Others: No Physical Aggression / Violence: No Duty to Warn: No Access to Firearms a concern: No  Assessment of progress:  stabilized  Diagnosis:   ICD-10-CM   1. Major depressive disorder, recurrent, in partial remission (HCC)  F33.41     2. Generalized anxiety disorder  F41.1     3. Attention deficit hyperactivity disorder (ADHD), predominantly inattentive type  F90.0     4. 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 Polysubstance abuse ([place]) - improved  F19.10      Plan:  Academic stress management: Anti-procrastination -- self-remind of his purposes/goals, break down task requirements until he can willing take first step, and if unwilling to put in the work right now, be explicit with self about it Practice identifying tasks to go ahead with and work ahead, self-affirm  he'll appreciate it soon enough vs. piling up work Film/video editor self-motivation -- not "trying harder" but "just do ___ then see how it goes".  Don't make it about "having" motivation but about applying the motivation he has for small, doable things to break inertia. Make use of TAs and tutoring PRN, or take the initiative to invite study buddies Where needed and available, supplement with [place] or other available self-instruction Anxiety/shame management -- Paced breathing prn, walk if restless. Try 2/day mini-meditations or slowdowns to recondition ANS arousal contributing to anxiety and ADHD.  Notice moments of motivational paralysis and treat them same as music mistakes -- "play through", a "note" at a time, with full freedom to acknowledge and emote (e.g., scream internally, permission to say "I'm really uncomfortable" ...) while doing it.   Sleep and circadian management -- Resist staying up too late, accept end of the day.  Keep CPAP in good order and improve use.  Blue light curfew or regular use of actual orange  lenses recommended.   Overall health, wellbeing -- Seek more movement/exercise.  Look into "exercise snacks", especially if they will elevate HR or get winded for a bit.  May do double duty as desensitization to panic.  Option to use MCT oil as the only calories after food curfew (e.g., 10pm) and "keep real food for real daylight".  Continue driving down night snacking. Nutrition: Given MTHFR, recommend add B complex supplement or go on to methylated X91 and folic acid MVI probably also worthwhile given issues and some degraded nutrition; micronutrient supplementation may help stem night hunger as well Continue practice of resisting night eating and doing something else at least 10 minutes, accepting going to bed a little hungry  Attention and time awareness tactics -- try 1-minute "listenings" as mindfulness practice; option to practice estimating 1 minute elapsed without  using a timepiece; use music or a shower clock to foster awareness of elapsed time in shower Communication with parents -- Continue agreement to let good night be good night, without late interruptions or veiled late attempts to motivate or caution.  Cont assert as needed if feedback is guilting, catastrophizing, "dogpiling", or "looping".  For pestering, either forgive and take with a grain of salt or seek agreements to be short and blunt rather than prolonged and tutorial, e.g., rather hear "Will you PLEASE take better care of my son?!" than go on about what he should be doing, or how it just takes will power, or not trying hard enough, etc.  Option to ask her to reconsider whether she knows how it feels dealing with cravings, as F and B do, but willing to frame it as "It is hard AND I'm trying."  Option to note and thank for detectable efforts restraining tendencies to project or transfer stress onto him. Social/morale: Continue exploring friend group Option to address Wilhemina Cash constructively [employer] job instead of fast food Endorse exploring possible dating Endorse working with the new car and exploring community of interest Substances -- reduce/refrain nicotine, recommend abstain from pot, keep alcohol moderate  Other recommendations/advice as may be noted above Continue to utilize previously learned skills ad lib Maintain medication as prescribed and work faithfully with relevant prescriber(s) if any changes are desired or seem indicated Call the clinic on-call service, 988/hotline, 911, or present to [facility] or ER if any life-threatening psychiatric crisis Return for as already scheduled. Already scheduled visit in this office 02/21/2022.  Blanchie Serve, PhD Luan Moore, PhD LP Clinical Psychologist, [facility] [address], [place], [place] [address] [phone]

## 2022-02-07 ENCOUNTER — Encounter: Payer: Self-pay | Admitting: Neurology

## 2022-02-07 ENCOUNTER — Ambulatory Visit (INDEPENDENT_AMBULATORY_CARE_PROVIDER_SITE_OTHER): Payer: 59 | Admitting: Neurology

## 2022-02-07 VITALS — BP 116/75 | HR 105 | Ht 70.0 in | Wt 207.5 lb

## 2022-02-07 DIAGNOSIS — G4733 Obstructive sleep apnea (adult) (pediatric): Secondary | ICD-10-CM | POA: Insufficient documentation

## 2022-02-07 DIAGNOSIS — F331 Major depressive disorder, recurrent, moderate: Secondary | ICD-10-CM | POA: Diagnosis not present

## 2022-02-07 DIAGNOSIS — G4719 Other hypersomnia: Secondary | ICD-10-CM

## 2022-02-07 DIAGNOSIS — F901 Attention-deficit hyperactivity disorder, predominantly hyperactive type: Secondary | ICD-10-CM

## 2022-02-07 NOTE — Patient Instructions (Signed)
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
## 2022-02-11 ENCOUNTER — Telehealth: Payer: Self-pay

## 2022-02-21 ENCOUNTER — Ambulatory Visit (INDEPENDENT_AMBULATORY_CARE_PROVIDER_SITE_OTHER): Payer: 59 | Admitting: Psychiatry

## 2022-02-21 DIAGNOSIS — G4730 Sleep apnea, unspecified: Secondary | ICD-10-CM | POA: Diagnosis not present

## 2022-02-21 DIAGNOSIS — F411 Generalized anxiety disorder: Secondary | ICD-10-CM | POA: Diagnosis not present

## 2022-02-21 DIAGNOSIS — F3341 Major depressive disorder, recurrent, in partial remission: Secondary | ICD-10-CM | POA: Diagnosis not present

## 2022-02-21 DIAGNOSIS — F900 Attention-deficit hyperactivity disorder, predominantly inattentive type: Secondary | ICD-10-CM | POA: Diagnosis not present

## 2022-02-21 DIAGNOSIS — F1910 Other psychoactive substance abuse, uncomplicated: Secondary | ICD-10-CM

## 2022-02-21 NOTE — Progress Notes (Signed)
Psychotherapy Progress Note [facility] Luan Moore, PhD LP  Patient ID: Louis [name] [facility])    MRN: [id number] Therapy format: Individual psychotherapy Date: 02/21/2022      Start: 2:23p     Stop: 2:55p     Time Spent: 32 min Location: Telehealth visit -- I connected with this patient by an approved telecommunication method (video), with his informed consent, and verifying identity and patient privacy.  I was located at my office and patient at his home.  As needed, we discussed the limitations, risks, and security and privacy concerns associated with telehealth service, including the availability and conditions which currently govern in-person appointments and the possibility that 3rd-party payment may not be fully guaranteed and he may be responsible for charges.  After he indicated understanding, we proceeded with the session.  Also discussed treatment planning, as needed, including ongoing verbal agreement with the plan, the opportunity to ask and answer all questions, his demonstrated understanding of instructions, and his readiness to call the office should symptoms worsen or he feels he is in a crisis state and needs more immediate and tangible assistance.   Session narrative (presenting needs, interim history, self-report of stressors and symptoms, applications of prior therapy, status changes, and interventions made in session) Converted to video appt due to a crush of work responsibilities and family issues including brother suddenly getting dx'd with severe hypertension, empathic stress of it.  Personally, concerned for time management, having to consider whether to drop his job at [employer].  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  While manager is very nice and appreciative, recent concern that she may be pulling from the tip jar, and if it is turning dishonest there, he would rather pull out.  Does struggle with feeling obligated to stay,  though, partly to make sure he doesn't seem like a quitter, partly to make sure he doesn't avoid taking on healthy challenges.  Assured those are noble, but probably overdone.  OK to be practical.  Encouraged to use the dilemma to do a pro/con list, validate the various reasons on both sides, and make a call.  Has finally achieved the habit of putting his phone out of reach of bed, which is helping him get up more promptly.  When parents were out of town, he did not oversleep or hole up in the bed.  Recent sleep treatment visit confirms he's doing better with CPAP and sleep hygiene.  Other good news that he had first exams this semester, Calc II got a B, improving on past record of doing poorly on first exam and having to bring it up.  Other courses not a challenging.  Still working on the sober rave project with his friend Thomasena Edis, and have met cool DJs who could be involved.  Wilhemina Cash may be involved, too, with some hope of a music production project drawing him out of substance abuse.  Affirmed and encouraged.  Therapeutic modalities: Cognitive Behavioral Therapy, Solution-Oriented/Positive Psychology, and Ego-Supportive  Mental Status/Observations:  Appearance:   Casual     Behavior:  Appropriate  Motor:  Normal  Speech/Language:   Clear and Coherent  Affect:  Appropriate  Mood:  anxious and less  Thought process:  normal  Thought content:    WNL  Sensory/Perceptual disturbances:    WNL  Orientation:  Fully oriented  Attention:  Good    Concentration:  Good  Memory:  WNL  Insight:    Good  Judgment:  Good  Impulse Control:  Good   Risk Assessment: Danger to Self: No Self-injurious Behavior: No Danger to Others: No Physical Aggression / Violence: No Duty to Warn: No Access to Firearms a concern: No  Assessment of progress:  progressing  Diagnosis:   ICD-10-CM   1. Generalized anxiety disorder  F41.1     2. 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 Attention deficit hyperactivity disorder (ADHD), predominantly inattentive type  F90.0     4. Sleep apnea treated with CPAP  G47.30     5. Polysubstance abuse ([place]) - improved  F19.10      Plan:  Academic stress management: Anti-procrastination -- self-remind of his purposes/goals, break down task requirements until he can willing take first step, and if unwilling to put in the work right now, be explicit with self about it Practice identifying tasks to go ahead with and work ahead, self-affirm he'll appreciate it soon enough vs. piling up work Film/video editor self-motivation -- not "trying harder" but "just do ___ then see how it goes".  Don't make it about "having" motivation but about applying the motivation he has for small, doable things to break inertia. Make use of TAs and tutoring PRN, or take the initiative to invite study buddies Where needed and available, supplement with [place] or other available self-instruction Anxiety/shame management -- Paced breathing prn, walk if restless. Try 2/day mini-meditations or slowdowns to recondition ANS arousal contributing to anxiety and ADHD.  Notice moments of motivational paralysis and treat them same as music mistakes -- "play through", a "note" at a time, with full freedom to acknowledge and emote (e.g., scream internally, permission to say "I'm really uncomfortable" ...) while doing it.   Sleep and circadian management -- Resist staying up too late, accept end of the day.  Keep phone out of reach at bed.  Keep CPAP in good order and regulate use.  Blue light curfew or regular use of actual orange lenses recommended. Metabolic self-regulation and nutrition -- Seek more movement/exercise.  Look into "exercise snacks", especially if they will elevate HR or get winded for a bit.  May do double duty as desensitization to panic.  Continue driving down night snacking.  Option to use MCT oil as the only calories after food curfew (e.g.,  10pm) if munchies are too strong to resist, and "keep real food for real daylight".  Given MTHFR, recommend add B complex supplement or go on to methylated W09 and folic acid.  MVI probably also worthwhile, given issues and some degraded nutrition, and how supplemented micronutrients may help stem night hunger.  Continue practice of resisting night eating and doing something else at least 10 minutes, accepting going to bed a little hungry  Attention and time awareness -- try 1-minute "listenings" as mindfulness practice; option to practice estimating 1 minute elapsed without using a timepiece; use music or a shower clock to ground awareness of elapsed time in shower Communication with parents -- Continue agreement to let good night be good night, without late interruptions or veiled late attempts to motivate or caution.  Cont assert as needed if feedback is guilting, catastrophizing, "dogpiling", or "looping".  For pestering, either forgive and take with a grain of salt or seek agreements to be short and blunt rather than prolonged and tutorial, e.g., rather hear "Will you PLEASE take better care of my son?!" than go on about what he should be doing, or how it just  takes will power, or not trying hard enough, etc.  Option to ask her to reconsider whether she knows how it feels dealing with cravings, as F and B do, but willing to frame it as "It is hard AND I'm trying."  Option to note and thank for detectable efforts restraining tendencies to project or transfer stress onto him. Social/morale -- Continue exploring friend group.  Option to address Wilhemina Cash constructively, because he cares.  Option to re-explore dating.  Endorse working with the new car and exploring community of shared interest. Substances -- reduce/refrain nicotine, recommend abstain from pot, keep alcohol moderate, bring it up if need help changing that Other recommendations/advice as may be noted above Continue to utilize previously learned  skills ad lib Maintain medication as prescribed and work faithfully with relevant prescriber(s) if any changes are desired or seem indicated Call the clinic on-call service, 988/hotline, 911, or present to [facility] or ER if any life-threatening psychiatric crisis Return for as already scheduled. Already scheduled visit in this office 03/21/2022.  Blanchie Serve, PhD Luan Moore, PhD LP Clinical Psychologist, [facility] [address], [place], [place] [address] [phone]

## 2022-03-07 ENCOUNTER — Ambulatory Visit: Payer: 59 | Admitting: Psychiatry

## 2022-03-21 ENCOUNTER — Ambulatory Visit (INDEPENDENT_AMBULATORY_CARE_PROVIDER_SITE_OTHER): Payer: 59 | Admitting: Psychiatry

## 2022-03-21 DIAGNOSIS — F3341 Major depressive disorder, recurrent, in partial remission: Secondary | ICD-10-CM

## 2022-03-21 DIAGNOSIS — F509 Eating disorder, unspecified: Secondary | ICD-10-CM

## 2022-03-21 DIAGNOSIS — F900 Attention-deficit hyperactivity disorder, predominantly inattentive type: Secondary | ICD-10-CM | POA: Diagnosis not present

## 2022-03-21 DIAGNOSIS — F1910 Other psychoactive substance abuse, uncomplicated: Secondary | ICD-10-CM

## 2022-03-21 DIAGNOSIS — F411 Generalized anxiety disorder: Secondary | ICD-10-CM | POA: Diagnosis not present

## 2022-03-21 NOTE — Progress Notes (Signed)
Psychotherapy Progress Note [facility] [name], PhD LP  Patient ID: Louis [name] [facility])    MRN: [id number] Therapy format: Individual psychotherapy Date: 03/21/2022      Start: 2:10p     Stop: 2:55p     Time Spent: 45 min Location: In-person   Session narrative (presenting needs, interim history, self-report of stressors and symptoms, applications of prior therapy, status changes, and interventions made in session) Anhedonic again, not sure why.  No allergies or sleep dfxs identified.  Admittedly, probably eating worse again, with late carbs.  Frustrated with a pesky error code in his car, and he found ants in the cat food.  The shine is wearing off his interest in making music.  Admits he's felt this way before when he's been off pot a while, as he has been again now.  In recent times, smoking has only given him about 10 minutes of better feeling anyway.  Typical midday lull going on.  Affirmed improved resistance to using pot, normalized emotional trough as quite possibly his motivational brain reworking how to respond without THC provoking dopamine for him.  Recommended "exercise snacks" as energy makers and ways to recruit better metabolism and carb resistance, too.  Given likely allergy contribution, encouraged use his Zyrtec.  And of course restrain night eating again.  In support of his frustration, suggested he identify outlets for anger and frustration, specially OK ways to vocalize it, as they might help relieve any affective suppression habit that contributes also to depression.  Cautious interest noted in psychedelic therapy, oriented to [place].    Therapeutic modalities: Cognitive Behavioral Therapy and Solution-Oriented/Positive Psychology  Mental Status/Observations:  Appearance:   Casual     Behavior:  Appropriate  Motor:  Normal  Speech/Language:   Clear and Coherent  Affect:  Appropriate  Mood:  dysthymic  Thought process:  normal  Thought  content:    WNL  Sensory/Perceptual disturbances:    WNL  Orientation:  Fully oriented  Attention:  Good    Concentration:  Good  Memory:  WNL  Insight:    Good  Judgment:   Good  Impulse Control:  Fair   Risk Assessment: Danger to Self: No Self-injurious Behavior: No Danger to Others: No Physical Aggression / Violence: No Duty to Warn: No Access to Firearms a concern: No  Assessment of progress:  situational setback(s)  Diagnosis:   ICD-10-CM   1. Major depressive disorder, recurrent, in partial remission ([facility])  F33.41     2. Generalized anxiety disorder  F41.1     3. Attention deficit hyperactivity disorder (ADHD), predominantly inattentive type  F90.0     4. Polysubstance abuse ([facility]) - improved  F19.10     5. Compulsive eating patterns (r/o carb addiction)  F50.9      Plan:  Academic stress management: Anti-procrastination -- self-remind of his purposes/goals, break down task requirements until he can willing take first step, and if unwilling to put in the work right now, be explicit with self about it Practice identifying tasks to go ahead with and work ahead, self-affirm he'll appreciate it soon enough vs. piling up work Biomedical scientist self-motivation -- not "trying harder" but "just do ___ then see how it goes".  Don't make it about "having" motivation but about applying the motivation he has for small, doable things to break inertia. Make use of TAs and tutoring PRN, or take the initiative to invite study buddies Where needed and available, supplement with [name] or  other available self-instruction Anxiety/shame management -- Paced breathing prn, walk if restless. Try 2/day mini-meditations or slowdowns to recondition ANS arousal contributing to anxiety and ADHD.  Notice moments of motivational paralysis and treat them same as music mistakes -- "play through", a "note" at a time, with full freedom to acknowledge and emote (e.g., scream internally, permission  to say "I'm really uncomfortable" ...) while doing it.   Sleep and circadian management -- Resist staying up too late, accept end of the day.  Keep phone out of reach at bed.  Keep CPAP in good order and regulate use.  Blue light curfew or regular use of actual orange lenses recommended. Metabolic self-regulation and nutrition -- Seek more movement/exercise.  Look into "exercise snacks", especially if they will elevate HR or get winded for a bit.  May do double duty as desensitization to panic.  Continue driving down night snacking.  Continue practice of resisting night eating and doing something else at least 10 minutes, accepting going to bed a little hungry.  Option to use MCT oil as the only calories after a food curfew (e.g., 10pm) if munchies are too strong to resist, and "keep real food for real daylight".  Given MTHFR, recommend add B complex supplement or go on and try methylated B12 and folic acid.  MVI probably also worthwhile, given issues and some degraded nutrition, and how supplemented micronutrients may help stem night hunger.   Attention and time awareness -- try 1-minute "listenings" as mindfulness practice; option to practice estimating 1 minute elapsed without using a timepiece; use music or a shower clock to ground awareness of elapsed time in shower Communication with parents -- Continue agreement to let good night be good night, without late interruptions or veiled late attempts to motivate or caution.  Cont assert as needed if feedback is guilting, catastrophizing, "dogpiling", or "looping".  For pestering, either forgive and take with a grain of salt or seek agreements to be short and blunt rather than prolonged and tutorial, e.g., rather hear "Will you PLEASE take better care of my son?!" than go on about what he should be doing, or how it just takes will power, or not trying hard enough, etc.  Option to ask her to reconsider whether she knows how it feels dealing with cravings, as F  and B do, but willing to frame it as "It is hard AND I'm trying."  Option to note and thank for detectable efforts restraining tendencies to project or transfer stress onto him. Social/morale -- Continue exploring friend group.  Option to address [name] constructively, because he cares.  Option to re-explore dating.  Endorse working with the new car and exploring community of shared interest. Substances -- reduce/refrain nicotine, recommend abstain from pot, keep alcohol moderate, bring it up if need help changing that Other recommendations/advice as may be noted above Continue to utilize previously learned skills ad lib Maintain medication as prescribed and work faithfully with relevant prescriber(s) if any changes are desired or seem indicated Call the clinic on-call service, 988/hotline, 911, or present to [facility] or ER if any life-threatening psychiatric crisis No follow-ups on file. Already scheduled visit in this office 04/18/2022.  Robley Fries, PhD [name], PhD LP Clinical Psychologist, [facility] [address] [place], [place] [address] [phone]

## 2022-04-04 ENCOUNTER — Ambulatory Visit: Payer: 59 | Admitting: Psychiatry

## 2022-04-18 ENCOUNTER — Ambulatory Visit (INDEPENDENT_AMBULATORY_CARE_PROVIDER_SITE_OTHER): Payer: 59 | Admitting: Psychiatry

## 2022-04-18 DIAGNOSIS — G4730 Sleep apnea, unspecified: Secondary | ICD-10-CM

## 2022-04-18 DIAGNOSIS — G4721 Circadian rhythm sleep disorder, delayed sleep phase type: Secondary | ICD-10-CM

## 2022-04-18 DIAGNOSIS — F900 Attention-deficit hyperactivity disorder, predominantly inattentive type: Secondary | ICD-10-CM | POA: Diagnosis not present

## 2022-04-18 DIAGNOSIS — F1910 Other psychoactive substance abuse, uncomplicated: Secondary | ICD-10-CM

## 2022-04-18 DIAGNOSIS — F3341 Major depressive disorder, recurrent, in partial remission: Secondary | ICD-10-CM

## 2022-04-18 DIAGNOSIS — F4010 Social phobia, unspecified: Secondary | ICD-10-CM

## 2022-04-18 DIAGNOSIS — F509 Eating disorder, unspecified: Secondary | ICD-10-CM

## 2022-04-18 DIAGNOSIS — F411 Generalized anxiety disorder: Secondary | ICD-10-CM | POA: Diagnosis not present

## 2022-04-18 NOTE — Progress Notes (Unsigned)
Psychotherapy Progress Note [facility] [name], PhD LP  Patient ID: Louis [name] [facility]    MRN: [id number] Therapy format: Individual psychotherapy Date: 04/18/2022      Start: 3:22p     Stop: 4:04p     Time Spent: 42 min Location: In-person   Session narrative (presenting needs, interim history, self-report of stressors and symptoms, applications of prior therapy, status changes, and interventions made in session) Feeling more overwhelmed of late.  Schoolwork going OK, beginning to have more intrusive thoughts worrying about getting a job after degree, but degree completion and working world transition is still 3 semesters away.  Says mother is worrying aloud now about him getting an internship, this summer, which doesn't necessarily make sense to him, but it's enough to ignite doubt about whether he's going to screw it up procrastinating.  On the whole, advised get expert opinion from his academic advisor, with whom he is not in regular touch.  Today affected also by a sideways conversation with mom that seemed to include a litany of things he does wrong.  Not sure what it entailed, or whether it's an emotional reading, but probed his willingness to flag it if he is feeling, as he once called it, "dogpiled" by her and needs her to turn down the intensity to hear what's worth saying.  Probing his resources, turns out he's unaware of the career center, so encouraged to connect and book an in-person meeting there (available online, but he would like to reduce how much online happens, as -- outside of gaming -- it grates on him trying to interact with people that way).  Today more affected by conversation with mother.  Offered conjoint session if desired in 3 wks.    Re. 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  That, and the position will evaporate next semester.  Has offered assertively but just not  given much to do, though talking it over with the academic office did result in inquiring with the professor.  Figures he could ask about serving in another capacity this coming term.  Affirmed and encouraged.  Ghassan's work, meanwhile, still feels rather marginalizing -- just not the IQ or maturity among coworkers.  Support/empathy provided, and endorsed making a change there if he wants to, sticking if he wants to, no failure in either one.  Therapeutic modalities: Cognitive Behavioral Therapy, Solution-Oriented/Positive Psychology, Ego-Supportive, and Assertiveness/Communication  Mental Status/Observations:  Appearance:   Casual     Behavior:  Appropriate  Motor:  Normal  Speech/Language:   Clear and Coherent  Affect:  Constricted  Mood:  anxious  Thought process:  normal  Thought content:    worry  Sensory/Perceptual disturbances:    WNL  Orientation:  Fully oriented  Attention:  Good    Concentration:  Good  Memory:  WNL  Insight:    Good  Judgment:   Fair  Impulse Control:  Good   Risk Assessment: Danger to Self: No Self-injurious Behavior: No Danger to Others: No Physical Aggression / Violence: No Duty to Warn: No Access to Firearms a concern: No  Assessment of progress:  situational setback(s)  Diagnosis:   ICD-10-CM   1. Major depressive disorder, recurrent, in partial remission  F33.41     2. 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 Generalized anxiety disorder  F41.1     4. Attention deficit hyperactivity disorder (  ADHD), predominantly inattentive type  F90.0     5. Polysubstance abuse ([facility]) - improved  F19.10     6. Compulsive eating patterns (r/o carb addiction)  F50.9     7. Sleep apnea treated with CPAP  G47.30     8. 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 piling up work Biomedical scientist self-motivation -- not "trying harder" but "just do ___ then see how it goes".  Don't make it about "having" motivation but about applying the motivation he has for small, doable things to break inertia. Make use of TAs and tutoring PRN, or take the initiative to invite study buddies Where needed and available, supplement with [name] or other available self-instruction Get expert advice on internship timing and process from academic advisor before deciding mom is right to be alarmed. Make use of university career center to help organize how he looks to the working world and prepares Anxiety/shame management -- Paced breathing prn, walk if restless. Try 2/day mini-meditations or slowdowns to recondition ANS arousal contributing to anxiety and ADHD.  Notice moments of motivational paralysis and treat them same as music mistakes -- "play through", a "note" at a time, with full freedom to acknowledge and emote (e.g., scream internally, permission to say "I'm really uncomfortable" ...) while doing it.   Sleep and circadian management -- Resist staying up too late, accept end of the day.  Keep phone out of reach at bed.  Keep CPAP in good order and regulate use.  Blue light curfew or regular use of actual orange lenses recommended. Metabolic self-regulation and nutrition -- Seek more movement/exercise.  Look into "exercise snacks", especially if they will elevate HR or get winded for a bit.  May do double duty as desensitization to panic.  Continue driving down night snacking.  Continue practice of resisting night eating and doing something else at least 10 minutes, accepting going to bed a little hungry.  Option to use MCT oil as the only calories after a food curfew (e.g., 10pm) if munchies  are too strong to resist, and "keep real food for real daylight".  Given MTHFR, recommend add B complex supplement or go on and try methylated B12 and folic acid.  MVI probably also worthwhile, given issues and some degraded nutrition, and how supplemented micronutrients may help stem night hunger.   Attention and time awareness -- try 1-minute "listenings" as mindfulness practice; option to practice estimating 1 minute elapsed without using a timepiece; use music or a shower clock to ground awareness of elapsed time in shower Communication with parents -- Continue agreement to let good night be good night, without late interruptions or veiled late attempts to motivate or caution.  Cont assert as needed if feedback is guilting, catastrophizing, "dogpiling", or "looping".  For pestering, either forgive and take with a grain of salt or seek agreements to be short and blunt rather than prolonged and tutorial, e.g., rather hear "Will you PLEASE take better care of my son?!" than go on about what he should be doing, or how it just takes will power, or not trying hard enough, etc.  Option to  ask her to reconsider whether she knows how it feels dealing with cravings, as F and B do, but willing to frame it as "It is hard AND I'm trying."  Option to note and thank for detectable efforts restraining tendencies to project or transfer stress onto him. Social/morale -- Continue exploring friend group.  Option to address [name] constructively, because he cares.  Option to re-explore dating.  Endorse working with the new car and exploring community of shared interest. Substances -- reduce/refrain nicotine, recommend abstain from pot, keep alcohol moderate, bring it up if need help changing that Other recommendations/advice as may be noted above Continue to utilize previously learned skills ad lib Maintain medication as prescribed and work faithfully with relevant prescriber(s) if any changes are desired or seem  indicated Call the clinic on-call service, 988/hotline, 911, or present to [facility] or ER if any life-threatening psychiatric crisis Return for time as already scheduled. Already scheduled visit in this office 04/25/2022.  Robley Fries, PhD [name], PhD LP Clinical Psychologist, [facility] [address], [address] [place], [place] [address] [phone]

## 2022-04-25 ENCOUNTER — Ambulatory Visit (INDEPENDENT_AMBULATORY_CARE_PROVIDER_SITE_OTHER): Payer: 59 | Admitting: Adult Health

## 2022-04-25 ENCOUNTER — Encounter: Payer: Self-pay | Admitting: Adult Health

## 2022-04-25 DIAGNOSIS — F5105 Insomnia due to other mental disorder: Secondary | ICD-10-CM

## 2022-04-25 DIAGNOSIS — F99 Mental disorder, not otherwise specified: Secondary | ICD-10-CM

## 2022-04-25 DIAGNOSIS — F331 Major depressive disorder, recurrent, moderate: Secondary | ICD-10-CM

## 2022-04-25 DIAGNOSIS — F411 Generalized anxiety disorder: Secondary | ICD-10-CM

## 2022-04-25 DIAGNOSIS — F900 Attention-deficit hyperactivity disorder, predominantly inattentive type: Secondary | ICD-10-CM | POA: Diagnosis not present

## 2022-04-25 MED ORDER — ESCITALOPRAM OXALATE 20 MG PO TABS: 20.0000 mg | ORAL_TABLET | Freq: Every day | ORAL | 5 refills | Status: DC

## 2022-04-25 MED ORDER — METHYLPHENIDATE HCL ER (OSM) 36 MG PO TBCR
36.0000 mg | EXTENDED_RELEASE_TABLET | Freq: Every day | ORAL | 0 refills | Status: DC
Start: 2022-06-20 — End: 2022-07-25

## 2022-04-25 MED ORDER — METHYLPHENIDATE HCL ER (OSM) 36 MG PO TBCR: 36.0000 mg | EXTENDED_RELEASE_TABLET | Freq: Every day | ORAL | 0 refills | Status: DC

## 2022-04-25 MED ORDER — TRAZODONE HCL 100 MG PO TABS: ORAL_TABLET | ORAL | 5 refills | Status: DC

## 2022-04-25 MED ORDER — METHYLPHENIDATE HCL ER (OSM) 36 MG PO TBCR
36.0000 mg | EXTENDED_RELEASE_TABLET | Freq: Every day | ORAL | 0 refills | Status: DC
Start: 2022-05-23 — End: 2022-07-25

## 2022-04-25 NOTE — Progress Notes (Signed)
Louis [name] [id number] [birth year] 27 y.o.  Subjective:   Patient ID:  Louis [name] is a 27 y.o. (DOB 10/15/1995) male.  Chief Complaint: No chief complaint on file.   HPI Louis [name] presents to the office today for follow-up of MDD, ADHD, GAD, polysubstance abuse.  Describes mood today as "ok". Pleasant. Denies tearfulness. Mood symptoms - reports depression and anxiety - not every day, but manageable when he does have it. Reports some irritability - "wavers".  Denies recent panic attacks. Mood is consistent. Stating "I'm doing ok". Feels like medications are helpful. Living at home with parents. Stable interest and motivation. Taking medications as prescribed.  Energy levels stable. Active, has a regular exercise routine x 2 days a week. Enjoys some usual interests and activities. Single. Not dating. Lives with parents and cat. Spending time with family and friends. Appetite adequate. Weight stable - 215 to 220 pounds. Sleeps well most nights. Averages 6 to 8 hours. Focus and concentration stable. Completing tasks. Managing aspects of household. Taking 4 classes - [employer]. 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 Working part at [employer]. Denies SI or HI.  Denies AH or VH. Substance use - vapes nicotine Uses THC - decreased use ETOH use - decreased use   GAD-7    Flowsheet Row Counselor from 03/16/2021 in [facility]  Total GAD-7 Score 14      PHQ2-9    Flowsheet Row Counselor from 03/16/2021 in [facility] Counselor from 05/03/2020 in [facility]  PHQ-2 Total Score 5 5  PHQ-9 Total Score 21 20        Review of Systems:  Review of Systems  Musculoskeletal:  Negative for gait problem.  Neurological:  Negative for tremors.  Psychiatric/Behavioral:         Please refer to HPI    Medications: I have reviewed the patient's current medications.  Current Outpatient  Medications  Medication Sig Dispense Refill   escitalopram (LEXAPRO) 20 MG tablet Take 1 tablet (20 mg total) by mouth daily. 30 tablet 5   methylphenidate (CONCERTA) 36 MG PO CR tablet Take 1 tablet (36 mg total) by mouth daily. 30 tablet 0   methylphenidate (CONCERTA) 36 MG PO CR tablet Take 1 tablet (36 mg total) by mouth daily. 30 tablet 0   methylphenidate (CONCERTA) 36 MG PO CR tablet Take 1 tablet (36 mg total) by mouth daily. 30 tablet 0   traZODone (DESYREL) 100 MG tablet Take one tablet daily. 30 tablet 5   No current facility-administered medications for this visit.    Medication Side Effects: None  Allergies: No Known Allergies  Past Medical History:  Diagnosis Date   ADHD    Anxiety and depression     Past Medical History, Surgical history, Social history, and Family history were reviewed and updated as appropriate.   Please see review of systems for further details on the patient's review from today.   Objective:   Physical Exam:  There were no vitals taken for this visit.  Physical Exam  Lab Review:  No results found for: "NA", "K", "CL", "CO2", "GLUCOSE", "BUN", "CREATININE", "CALCIUM", "PROT", "ALBUMIN", "AST", "ALT", "ALKPHOS", "BILITOT", "GFRNONAA", "GFRAA"  No results found for: "WBC", "RBC", "HGB", "HCT", "PLT", "MCV", "MCH", "MCHC", "RDW", "LYMPHSABS", "MONOABS", "EOSABS", "BASOSABS"  No results found for: "POCLITH", "LITHIUM"   No results found for: "PHENYTOIN", "PHENOBARB", "VALPROATE", "CBMZ"   .res Assessment: Plan:    Plan:  PDMP reviewed  Lexapro  daily  Trazadone  - one tab at hs Concerta  daily  Using Clonazepam infrequently - has 10 pills remaining - none needed today.  Monitor BP between visits while taking stimulant medication.   Working with Dr Gabrielle Dare testing available.  RTC 3 months  Patient advised to contact office with any questions, adverse effects, or acute worsening in signs and  symptoms.  There are no diagnoses linked to this encounter.   Please see After Visit Summary for patient specific instructions.  Future Appointments  Date Time Provider Department Center  05/09/2022  1:00 PM Robley Fries, PhD [facility] None  05/30/2022  3:00 PM Robley Fries, PhD [facility] None  06/13/2022  1:00 PM Robley Fries, PhD [facility] None  06/27/2022  1:00 PM Robley Fries, PhD [facility] None  02/13/2023  2:00 PM Lomax, Amy, NP [facility] None    No orders of the defined types were placed in this encounter.   -------------------------------

## 2022-05-09 ENCOUNTER — Ambulatory Visit: Payer: 59 | Admitting: Psychiatry

## 2022-05-30 ENCOUNTER — Ambulatory Visit (INDEPENDENT_AMBULATORY_CARE_PROVIDER_SITE_OTHER): Payer: 59 | Admitting: Psychiatry

## 2022-05-30 DIAGNOSIS — F411 Generalized anxiety disorder: Secondary | ICD-10-CM

## 2022-05-30 DIAGNOSIS — F900 Attention-deficit hyperactivity disorder, predominantly inattentive type: Secondary | ICD-10-CM

## 2022-05-30 DIAGNOSIS — G4721 Circadian rhythm sleep disorder, delayed sleep phase type: Secondary | ICD-10-CM

## 2022-05-30 DIAGNOSIS — F4010 Social phobia, unspecified: Secondary | ICD-10-CM

## 2022-05-30 DIAGNOSIS — F509 Eating disorder, unspecified: Secondary | ICD-10-CM

## 2022-05-30 DIAGNOSIS — F3341 Major depressive disorder, recurrent, in partial remission: Secondary | ICD-10-CM | POA: Diagnosis not present

## 2022-05-30 DIAGNOSIS — F1910 Other psychoactive substance abuse, uncomplicated: Secondary | ICD-10-CM

## 2022-05-30 NOTE — Progress Notes (Signed)
Psychotherapy Progress Note [facility] [name], PhD LP  Patient ID: Louis [name] [facility])    MRN: [id number] Therapy format: Individual psychotherapy Date: 05/30/2022      Start: 3:18p     Stop: 4:04p     Time Spent: 46 min Location: In-person   Session narrative (presenting needs, interim history, self-report of stressors and symptoms, applications of prior therapy, status changes, and interventions made in session) Grades turned out good.  Taking summer classes including communications and biology.  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  Morale "pretty good for the most part".  Less gaming, more hardware music experimentation.  Getting some opportunity to perform at a "noise show" with friend [name].  Pressure is off about getting an internship now that it's clear he has another summer to go, so no further pressure from mother.    Picked up a new speed-running game, [employer], soon became the best player on it -- something he has "coveted" before -- but it's actually getting uncomfortable the attention he gets.  Struggles with feeling obligated to keep working the game, actually, when it's become not that rewarding, and the praise he gets starts to feel like strings attached.  Validated that there may be some people who expect the "celebrity" he's become to perform, but he has every right not to put that in, it's a fully free choice whether to work to maintain or go to the next interest, no actual obligations, just the price of fame, and maybe there really isn't the same load of expectations he perceives.  Talbot Grumbling tends to be flighty from one game to another, maybe has needed to be sure he isn't guilty of the same thing.  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  Further context on how [name] has fallen from grace as a friend to follow and look up to,  including how he seems to be just dumbing down in general and getting intoxicated.  Probed why Rustam might get irritable with that -- reminders of his own past immaturity, reminder of time lost, caring for friends and wanting to spare them the same waste or depression, and just an indication of being more alone in his principles.  Validated all, reaffirmed his freedom of association and freedom to hang out but manage expectations.  Therapeutic modalities: Cognitive Behavioral Therapy, Solution-Oriented/Positive Psychology, and Ego-Supportive  Mental Status/Observations:  Appearance:   Casual     Behavior:  Appropriate  Motor:  Normal  Speech/Language:   Clear and Coherent  Affect:  Appropriate  Mood:  normal and concerned, mildly dysthymic  Thought process:  normal  Thought content:    WNL  Sensory/Perceptual disturbances:    WNL  Orientation:  Fully oriented  Attention:  Good    Concentration:  Good  Memory:  WNL  Insight:    Good  Judgment:   Good  Impulse Control:  Good   Risk Assessment: Danger to Self: No Self-injurious Behavior: No Danger to Others: No Physical Aggression / Violence: No Duty to Warn: No Access to Firearms a concern: No  Assessment of progress:  progressing   Diagnosis:   ICD-10-CM   1. Generalized anxiety disorder  F41.1     2. Major depressive disorder, recurrent, in partial remission (HCC)  F33.41     3. Social anxiety disorder  F40.10     4. Polysubstance abuse (HCC) -  in remission  F19.10     5. Compulsive eating patterns (r/o carb addiction)  F50.9     6. Attention deficit hyperactivity disorder (ADHD), predominantly inattentive type  F90.0     7. 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  As needed: Anti-procrastination -- self-remind of his purposes/goals, break down task requirements until he can willing take first step, and if unwilling to put  in the work right now, be explicit with self about it Practice identifying tasks to go ahead with and work ahead, self-affirm he'll appreciate it soon enough vs. piling up work Biomedical scientist self-motivation -- not "trying harder" but "just do ___ then see how it goes".  Don't make it about "having" motivation but about applying the motivation he has for small, doable things to break inertia. Make use of TAs and tutoring PRN, or take the initiative to invite study buddies Where needed and available, supplement with [name] or other available self-instruction Get expert advice on internship timing and process from academic advisor before deciding mom is right to be alarmed. Make use of university career center to help organize how he looks to the working world and prepares Anxiety/shame management -- Paced breathing prn, walk if restless. Try 2/day mini-meditations or slowdowns to recondition ANS arousal contributing to anxiety and ADHD.  Notice moments of motivational paralysis and treat them same as music mistakes -- "play through", a "note" at a time, with full freedom to acknowledge and emote (e.g., scream internally, permission to say "I'm really uncomfortable" ...) while doing it.  Notice and rethink perfectionistic self-expectations. Sleep and circadian management -- Resist staying up too late, accept a reasonable endpoint to the day.  Keep phone out of reach at bed.  Keep CPAP in good order and regular use.  Recommend blue light curfew or use actual orange lenses 1-2 hrs before bed. Metabolic self-regulation and nutrition -- Seek more movement/exercise.  Look into "exercise snacks", especially if they will elevate HR or get winded for a bit.  May do double duty as desensitization to panic.  Continue driving down night snacking.  Continue practice of resisting night eating and doing something else at least 10 minutes, accepting going to bed a little hungry.  Option to use MCT oil as the  only calories after a food curfew (e.g., 10pm) if munchies are too strong to resist, and "keep real food for real daylight".  Given MTHFR, recommend add B complex supplement or go on and try methylated B12 and folic acid.  MVI probably also worthwhile, given issues and some degraded nutrition, and how supplemented micronutrients may help stem night hunger.   Attention and time awareness v. distraction -- try 1-minute "listenings" as mindfulness practice; option to practice estimating 1 minute elapsed without using a timepiece; use music or a shower clock to ground awareness of elapsed time in shower Communication with parents -- Continue agreement to let good night be good night, without late interruptions or veiled late attempts to motivate or caution.  Cont assert as needed if feedback is unnecessarily guilting, catastrophizing, "dogpiling", or "looping".  For pestering (Mom, mainly), either forgive and take it with a grain of salt, or solicit agreements to be short and blunt rather than prolonged and tutorial, e.g., I'd rather hear "Will you PLEASE take better care of my son?!" than go on about what I should be doing, or how it just takes  will power, or I'm not trying hard enough, etc. Re. Food, option to ask M to reconsider whether she knows how it feels dealing with food cravings, as F and B do, but willing to frame it as "It is hard AND I am trying."  Option to notice and thank M for detectable efforts restraining her tendencies to project, lecture, or otherwise transfer her distress to him. Social/morale -- Continue exploring friend group, permission to realign and rightsize.  Option to address [name] constructively, just because he cares.  Option to re-explore dating.  Endorse working with the new car and exploring community of shared interest. Substances -- reduce/refrain nicotine, recommend abstain from pot, keep alcohol moderate, bring it up if need help changing that Other recommendations/advice as  may be noted above Continue to utilize previously learned skills ad lib Maintain medication as prescribed and work faithfully with relevant prescriber(s) if any changes are desired or seem indicated Call the clinic on-call service, 988/hotline, 911, or present to [facility] or ER if any life-threatening psychiatric crisis Return for time as already scheduled. Already scheduled visit in this office 06/13/2022.  Robley Fries, PhD [name], PhD LP Clinical Psychologist, [facility] [address], [address] [place], [place] [address] [phone]

## 2022-06-13 ENCOUNTER — Ambulatory Visit (INDEPENDENT_AMBULATORY_CARE_PROVIDER_SITE_OTHER): Payer: 59 | Admitting: Psychiatry

## 2022-06-13 DIAGNOSIS — F4010 Social phobia, unspecified: Secondary | ICD-10-CM | POA: Diagnosis not present

## 2022-06-13 DIAGNOSIS — F3341 Major depressive disorder, recurrent, in partial remission: Secondary | ICD-10-CM | POA: Diagnosis not present

## 2022-06-13 DIAGNOSIS — F509 Eating disorder, unspecified: Secondary | ICD-10-CM | POA: Diagnosis not present

## 2022-06-13 DIAGNOSIS — F900 Attention-deficit hyperactivity disorder, predominantly inattentive type: Secondary | ICD-10-CM | POA: Diagnosis not present

## 2022-06-13 DIAGNOSIS — F1910 Other psychoactive substance abuse, uncomplicated: Secondary | ICD-10-CM

## 2022-06-13 DIAGNOSIS — G4721 Circadian rhythm sleep disorder, delayed sleep phase type: Secondary | ICD-10-CM

## 2022-06-13 NOTE — Progress Notes (Signed)
Psychotherapy Progress Note [facility] Louis Czar, PhD LP  Patient ID: [name])    MRN: [id number] Therapy format: Individual psychotherapy Date: 06/13/2022      Start: 1:11p     Stop: 1:57p     Time Spent: 46 min Location: In-person   Session narrative (presenting needs, interim history, self-report of stressors and symptoms, applications of prior therapy, status changes, and interventions made in session) Gave himself permission to pull back from the game he hit the top on, realized he was the only one putting pressure on himself, freed up.  Having a more peaceful experience in a game where you create a character, try to survive amid zombies, there are no speed records to make, an when your character dies, it's a full fresh start, nothing to preserve and feel obligated to maintain.  Affirmed and encouraged.  New male friend, Louis [name], met at work.  Sounds like she might come available as she just broke up with a painfully jealous BF, Louis [name], who also seems to be setting himself up to get arrested for drugs and/or harassment.  Drama recently in that he typically parks at the [place], just tried parking next to Louis [name], apparently trying to intimidate her into reuniting.  She shrewdly had coworkers moe her car and walk her out.  Some concern how he'll behave if he gets more desperate to control her, or more focused on Louis [name] as the new guy in her life.  Some hope of relief in knowing he's scheduled to be sentenced on several charges later this summer.  Until then, some worry about being accosted.  Discussed safety measures, including having coworkers ready to call 911, possibly reporting him for loitering, accompanying each other in/out until threat removed, and verbal deescalation (e.g., how he's not actually interested in Louis [name] personally, not a threat, just job friends.  Near end of summer term, it's going well with his class.  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    Parents have travel plans mid June, so some anticipatory anxiety about being alone in the house (both loneliness and keeping up with his accepted responsibilities).    Will have his 1st noise show with Louis [name] on 6/22.  Looking to possibly live with Louis [name] later on, or set up solitary in an apartment.  Knows his depression blossomed in being too alone too long, but also feels like it may be good emotional exercise for him to deal with solitude for a while.  Suggested a balance of dedicated time alone and social commitments, e.g., a workout buddy, or accountability relationship Louis [name], brother Louis [name], e.g.).  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  Also taking inspiration from a gaming buddy who documented losing from 350 to180 lbs.  Affirmed and encouraged.  Therapeutic modalities: Cognitive Behavioral Therapy, Solution-Oriented/Positive Psychology, Environmental manager, and Motivational Interviewing  Mental Status/Observations:  Appearance:   Casual     Behavior:  Appropriate  Motor:  Normal  Speech/Language:   Clear and Coherent  Affect:  Appropriate  Mood:  normal  Thought process:  normal  Thought content:    WNL  Sensory/Perceptual disturbances:    WNL  Orientation:  Fully oriented  Attention:  Good    Concentration:  Good  Memory:  WNL  Insight:    Good  Judgment:   Good  Impulse Control:  Good   Risk Assessment: Danger to Self: No Self-injurious  Behavior: No Danger to Others: No Physical Aggression / Violence: No Duty to Warn: No Access to Firearms a concern: No  Assessment of progress:  progressing  Diagnosis:   ICD-10-CM   1. Social anxiety disorder  F40.10     2. Major depressive disorder, recurrent, in partial remission (HCC)  F33.41     3. Attention deficit hyperactivity disorder (ADHD), predominantly inattentive type  F90.0     4. Compulsive eating  patterns (r/o carb addiction)  F50.9     5. Polysubstance abuse (HCC) - in remission  F19.10     6. 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  As needed: Anti-procrastination -- self-remind of his purposes/goals, break down task requirements until he can willing take first step, and if unwilling to put in the work right now, be explicit with self about it Practice identifying tasks to go ahead with and work ahead, self-affirm he'll appreciate it soon enough vs. piling up work Biomedical scientist self-motivation -- not "trying harder" but "just do ___ then see how it goes".  Don't make it about "having" motivation but about applying the motivation he has for small, doable things to break inertia. Make use of TAs and tutoring PRN, or take the initiative to invite study buddies Where needed and available, supplement with [name] or other available self-instruction Get expert advice on internship timing and process from academic advisor before deciding mom is right to be alarmed. Make use of university career center to help organize how he looks to the working world and prepares Anxiety/shame management -- Paced breathing prn, walk if restless. Try 2/day mini-meditations or slowdowns to recondition ANS arousal contributing to anxiety and ADHD.  Notice moments of motivational paralysis and treat them same as music mistakes -- "play through", a "note" at a time, with full freedom to acknowledge and emote (e.g., scream internally, permission to say "I'm really uncomfortable" ...) while doing it.  Notice and rethink perfectionistic self-expectations. Sleep and circadian management -- Resist staying up too late, accept a reasonable endpoint to the day.  Keep phone out of reach at bed.  Keep CPAP in good order and regular use.  Recommend blue light curfew or use actual orange lenses 1-2 hrs before bed. Metabolic  self-regulation and nutrition -- Seek more movement/exercise.  Look into "exercise snacks", especially if they will elevate HR or get winded for a bit.  May do double duty as desensitization to panic.  Continue driving down night snacking.  Continue practice of resisting night eating and doing something else at least 10 minutes, accepting going to bed a little hungry.  Option to use MCT oil as the only calories after a food curfew (e.g., 10pm) if munchies are too strong to resist, and "keep real food for real daylight".  Given MTHFR, recommend add B complex supplement or go on and try methylated B12 and folic acid.  MVI probably also worthwhile, given issues and some degraded nutrition, and how supplemented micronutrients may help stem night hunger.   Attention and time awareness v. distraction -- try 1-minute "listenings" as mindfulness practice; option to practice estimating 1 minute elapsed without using a timepiece; use music or a shower clock to ground awareness of elapsed time in shower Communication with parents -- Continue agreement to let good night be good night, without late interruptions or veiled late attempts to motivate or caution.  Cont assert as needed  if feedback is unnecessarily guilting, catastrophizing, "dogpiling", or "looping".  For pestering (Mom, mainly), either forgive and take it with a grain of salt, or solicit agreements to be short and blunt rather than prolonged and tutorial, e.g., I'd rather hear "Will you PLEASE take better care of my son?!" than go on about what I should be doing, or how it just takes will power, or I'm not trying hard enough, etc. Re. Food, option to ask M to reconsider whether she knows how it feels dealing with food cravings, as F and B do, but willing to frame it as "It is hard AND I am trying."  Option to notice and thank M for detectable efforts restraining her tendencies to project, lecture, or otherwise transfer her distress to him. Social/morale --  Continue exploring friend group, permission to realign and rightsize.  Option to address [name] constructively, just because he cares.  Option to re-explore dating.  Endorse working with the new car and exploring community of shared interest. Substances -- reduce/refrain nicotine, recommend abstain from pot, keep alcohol moderate, bring it up if need help changing that Other recommendations/advice -- As may be noted above.  Continue to utilize previously learned skills ad lib. Medication compliance -- Maintain medication as prescribed and work faithfully with relevant prescriber(s) if any changes are desired or seem indicated. Crisis service -- Aware of call list and work-in appts.  Call the clinic on-call service, 988/hotline, 911, or present to [facility] or ER if any life-threatening psychiatric crisis. Followup -- Return for time as already scheduled.  Next scheduled visit with me 06/27/2022.  Next scheduled in this office 06/27/2022.  Robley Fries, PhD Louis Czar, PhD LP Clinical Psychologist, [facility] [address], [address] [place], [place] [address] [phone]

## 2022-06-27 ENCOUNTER — Ambulatory Visit (INDEPENDENT_AMBULATORY_CARE_PROVIDER_SITE_OTHER): Payer: 59 | Admitting: Psychiatry

## 2022-06-27 DIAGNOSIS — F3341 Major depressive disorder, recurrent, in partial remission: Secondary | ICD-10-CM | POA: Diagnosis not present

## 2022-06-27 DIAGNOSIS — F4010 Social phobia, unspecified: Secondary | ICD-10-CM | POA: Diagnosis not present

## 2022-06-27 DIAGNOSIS — F900 Attention-deficit hyperactivity disorder, predominantly inattentive type: Secondary | ICD-10-CM

## 2022-06-27 DIAGNOSIS — G4721 Circadian rhythm sleep disorder, delayed sleep phase type: Secondary | ICD-10-CM | POA: Diagnosis not present

## 2022-06-27 NOTE — Progress Notes (Signed)
Psychotherapy Progress Note [facility] [name], PhD LP  Patient ID: Louis [name] [facility])    MRN: [id number] Therapy format: Individual psychotherapy Date: 06/27/2022      Start: 5:11p     Stop: 5:57p     Time Spent: 46 min Location: In-person   Session narrative (presenting needs, interim history, self-report of stressors and symptoms, applications of prior therapy, status changes, and interventions made in session) Rescheduled after was asleep at 1pm.  Says he's faring better with mood and ability to get up and out, less prone to anxious paralysis while parents away.  And less blue feelings.  Does go soft on time regulation with no academic requirements or parental requirements going right now.  Saw himself lose circadian grounding the first couple days but making his way back.  Slack about preparing food, and prone to get too-many-choice paralysis and default to convenience foods.  Coached in making a few low-effort choices to get back in the habit.  Financially, has been putting off reviewing his credit card statement for reimburseable items parents have pledged.  Coached to break down the task, subdivide if need be, but make what he asks of his resistant self small enough to just start, and finish an amount in short enough order.    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  Some quandaries about how much to prerecord and how much to leave on himself to perform live.  Encouraged to prerecord enough to be comfortable vs. performance anxiety.  Work issue is toning down.  Coworker [name] hasn't been seen much, but know he's called Eralyn often, still trying to intimidate or buy a relationship with her.  Sees her coping OK, not sharply concerned any more.   Working at [employer] remains a mediocre experience but still acceptable for now.    Home life, Mom has less often dumped her woes, though it can still feel pressured sometimes with  her dumping out piles of concerns now and then.  Encouraged be willing to let her know if it's too much too fast, too abrupt, or needs clarifying whether she just wants him to hear things or is actually asking for changes.  Therapeutic modalities: Cognitive Behavioral Therapy, Solution-Oriented/Positive Psychology, and Interpersonal  Mental Status/Observations:  Appearance:   Casual     Behavior:  Appropriate and Drowsy  Motor:  Normal  Speech/Language:   Clear and Coherent  Affect:  Appropriate  Mood:  anxious  Thought process:  normal  Thought content:    WNL  Sensory/Perceptual disturbances:    WNL  Orientation:  Fully oriented  Attention:  Good    Concentration:  Good  Memory:  WNL  Insight:    Good  Judgment:   Good  Impulse Control:  Good   Risk Assessment: Danger to Self: No Self-injurious Behavior: No Danger to Others: No Physical Aggression / Violence: No Duty to Warn: No Access to Firearms a concern: No  Assessment of progress:  progressing  Diagnosis:   ICD-10-CM   1. Social anxiety disorder  F40.10     2. Major depressive disorder, recurrent, in partial remission (HCC)  F33.41     3. Attention deficit hyperactivity disorder (ADHD), predominantly inattentive type  F90.0     4. 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  As needed: Anti-procrastination -- self-remind of his purposes/goals, break down task requirements  until he can willing take first step, and if unwilling to put in the work right now, be explicit with self about it Practice identifying tasks to go ahead with and work ahead, self-affirm he'll appreciate it soon enough vs. piling up work Biomedical scientist self-motivation -- not "trying harder" but "just do ___ then see how it goes".  Don't make it about "having" motivation but about applying the motivation he has for small, doable things to break  inertia. Make use of TAs and tutoring PRN, or take the initiative to invite study buddies Where needed and available, supplement with [name] or other available self-instruction Get expert advice on internship timing and process from academic advisor before deciding mom is right to be alarmed. Make use of university career center to help organize how he looks to the working world and prepares Anxiety/shame management -- Paced breathing prn, walk if restless. Try 2/day mini-meditations or slowdowns to recondition ANS arousal contributing to anxiety and ADHD.  Notice moments of motivational paralysis and treat them same as music mistakes -- "play through", a "note" at a time, with full freedom to acknowledge and emote (e.g., scream internally, permission to say "I'm really uncomfortable" ...) while doing it.  Notice and rethink perfectionistic self-expectations. Sleep and circadian management -- Resist staying up too late, accept a reasonable endpoint to the day.  Keep phone out of reach at bed.  Keep CPAP in good order and regular use.  Recommend blue light curfew or use actual orange lenses 1-2 hrs before bed. Metabolic self-regulation and nutrition -- Seek more movement/exercise.  Look into "exercise snacks", especially if they will elevate HR or get winded for a bit.  May do double duty as desensitization to panic.  Continue driving down night snacking.  Continue practice of resisting night eating and doing something else at least 10 minutes, accepting going to bed a little hungry.  Continue shifting to more whole and prepared foods over convenience items.  Option to use MCT oil as the only calories after a food curfew (e.g., 10pm) if munchies are too strong to resist, and "keep real food for real daylight".  Given MTHFR, recommend add B complex supplement or go on and try methylated B12 and folic acid.  MVI probably also worthwhile, given issues and some degraded nutrition, and how supplemented  micronutrients may help stem night hunger.   Attention and time awareness v. distraction -- Try 1-minute "listenings" as mindfulness practice.  Option practice estimating 1 minute elapsed without using a timepiece.  If time blindness in shower or elsewhere, use music or a clock to ground awareness of elapsed time. Communication with parents -- Continue agreement to let good night be good night, without late interruptions or veiled late attempts to motivate or caution.  Cont assert as needed if feedback is unnecessarily guilting, catastrophizing, "dogpiling", or "looping".  For pestering (Mom, mainly), either forgive and take it with a grain of salt, or solicit agreements to be short and blunt rather than prolonged and tutorial, e.g., I'd rather hear "Will you PLEASE take better care of my son?!" than go on about what I should be doing, or how it just takes will power, or I'm not trying hard enough, etc.  Option to ask M to reconsider whether she knows how it feels dealing with an issue she might comment on, as F and B do, but willing to frame it as "It is hard AND I am trying."  Option to notice and thank M for  detectable efforts restraining her tendencies to project, lecture, or otherwise transfer her distress to him. Social/morale -- Continue exploring friend group, permission to realign and rightsize.  Option to address [name] constructively, just because he cares.  Option to re-explore dating.  Endorse working with the new car and exploring community of shared interest. Substances -- reduce/refrain nicotine, recommend abstain from pot, keep alcohol moderate, bring it up if need help changing that Other recommendations/advice -- As may be noted above.  Continue to utilize previously learned skills ad lib. Medication compliance -- Maintain medication as prescribed and work faithfully with relevant prescriber(s) if any changes are desired or seem indicated. Crisis service -- Aware of call list and work-in  appts.  Call the clinic on-call service, 988/hotline, 911, or present to [facility] or ER if any life-threatening psychiatric crisis. Followup -- Return for time as already scheduled.  Next scheduled visit with me 08/05/2022.  Next scheduled in this office 07/25/2022.  Robley Fries, PhD [name], PhD LP Clinical Psychologist, [facility] [address] [place], [place] [address] [phone]

## 2022-07-25 ENCOUNTER — Encounter: Payer: Self-pay | Admitting: Adult Health

## 2022-07-25 ENCOUNTER — Ambulatory Visit (INDEPENDENT_AMBULATORY_CARE_PROVIDER_SITE_OTHER): Payer: 59 | Admitting: Adult Health

## 2022-07-25 DIAGNOSIS — F1910 Other psychoactive substance abuse, uncomplicated: Secondary | ICD-10-CM

## 2022-07-25 DIAGNOSIS — F411 Generalized anxiety disorder: Secondary | ICD-10-CM

## 2022-07-25 DIAGNOSIS — F3341 Major depressive disorder, recurrent, in partial remission: Secondary | ICD-10-CM | POA: Diagnosis not present

## 2022-07-25 DIAGNOSIS — F900 Attention-deficit hyperactivity disorder, predominantly inattentive type: Secondary | ICD-10-CM

## 2022-07-25 MED ORDER — METHYLPHENIDATE HCL ER (OSM) 36 MG PO TBCR: 36.0000 mg | EXTENDED_RELEASE_TABLET | Freq: Every day | ORAL | 0 refills | Status: DC

## 2022-07-25 MED ORDER — METHYLPHENIDATE HCL ER (OSM) 36 MG PO TBCR
36.0000 mg | EXTENDED_RELEASE_TABLET | Freq: Every day | ORAL | 0 refills | Status: DC
Start: 2022-07-25 — End: 2022-10-31

## 2022-07-25 MED ORDER — METHYLPHENIDATE HCL ER (OSM) 36 MG PO TBCR
36.0000 mg | EXTENDED_RELEASE_TABLET | Freq: Every day | ORAL | 0 refills | Status: DC
Start: 2022-09-19 — End: 2022-10-31

## 2022-07-25 NOTE — Progress Notes (Signed)
Louis [name] [id number] July 14, 1995 27 y.o.  Subjective:   Patient ID:  Louis [name] is a 27 y.o. (DOB 03/03/1995) male.  Chief Complaint: No chief complaint on file.   HPI Louis [name] presents to the office today for follow-up of MDD, ADHD, GAD, polysubstance abuse.  Describes mood today as "ok". Pleasant. Denies tearfulness. Mood symptoms - reports depression and anxiety - "it's in a manageable spot". Reports some irritability - "mostly ok". Denies recent panic attacks. Reports some worry, ruminations, and over thinking - "more with interpersonal relationships". Mood is consistent. Stating "I'm doing ok". Feels like medications are helpful. Living at home with parents. Stable interest and motivation. Taking medications as prescribed.  Energy levels stable. Active, has a regular exercise routine x 2 days a week. Enjoys some usual interests and activities. Single. Not dating. Lives with parents and cat. Spending time with family and friends. Appetite adequate. Weight stable - 215 to 220 pounds. Sleeps well most nights. Averages 6 to 8 hours. Focus and concentration stable. Completing tasks. Managing aspects of household. Taking classes - [employer]. 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 Working part time at [employer]. Denies SI or HI.  Denies AH or VH. Substance use - vapes nicotine. Uses THC - decreased use. ETOH use - decreased use.   GAD-7    Flowsheet Row Counselor from 03/16/2021 in [facility]  Total GAD-7 Score 14      PHQ2-9    Flowsheet Row Counselor from 03/16/2021 in [facility] Counselor from 05/03/2020 in [facility]  PHQ-2 Total Score 5 5  PHQ-9 Total Score 21 20        Review of Systems:  Review of Systems  Musculoskeletal:  Negative for gait problem.  Neurological:  Negative for tremors.  Psychiatric/Behavioral:         Please refer to HPI    Medications: I  have reviewed the patient's current medications.  Current Outpatient Medications  Medication Sig Dispense Refill   escitalopram (LEXAPRO) 20 MG tablet Take 1 tablet (20 mg total) by mouth daily. 30 tablet 5   methylphenidate (CONCERTA) 36 MG PO CR tablet Take 1 tablet (36 mg total) by mouth daily. 30 tablet 0   methylphenidate (CONCERTA) 36 MG PO CR tablet Take 1 tablet (36 mg total) by mouth daily. 30 tablet 0   methylphenidate (CONCERTA) 36 MG PO CR tablet Take 1 tablet (36 mg total) by mouth daily. 30 tablet 0   traZODone (DESYREL) 100 MG tablet Take one tablet daily. 30 tablet 5   No current facility-administered medications for this visit.    Medication Side Effects: None  Allergies: No Known Allergies  Past Medical History:  Diagnosis Date   ADHD    Anxiety and depression     Past Medical History, Surgical history, Social history, and Family history were reviewed and updated as appropriate.   Please see review of systems for further details on the patient's review from today.   Objective:   Physical Exam:  There were no vitals taken for this visit.  Physical Exam Constitutional:      General: He is not in acute distress. Musculoskeletal:        General: No deformity.  Neurological:     Mental Status: He is alert and oriented to person, place, and time.     Coordination: Coordination normal.  Psychiatric:        Attention and Perception: Attention  and perception normal. He does not perceive auditory or visual hallucinations.        Mood and Affect: Affect is not labile, blunt, angry or inappropriate.        Speech: Speech normal.        Behavior: Behavior normal.        Thought Content: Thought content normal. Thought content is not paranoid or delusional. Thought content does not include homicidal or suicidal ideation. Thought content does not include homicidal or suicidal plan.        Cognition and Memory: Cognition and memory normal.        Judgment: Judgment  normal.     Comments: Insight intact     Lab Review:  No results found for: "NA", "K", "CL", "CO2", "GLUCOSE", "BUN", "CREATININE", "CALCIUM", "PROT", "ALBUMIN", "AST", "ALT", "ALKPHOS", "BILITOT", "GFRNONAA", "GFRAA"  No results found for: "WBC", "RBC", "HGB", "HCT", "PLT", "MCV", "MCH", "MCHC", "RDW", "LYMPHSABS", "MONOABS", "EOSABS", "BASOSABS"  No results found for: "POCLITH", "LITHIUM"   No results found for: "PHENYTOIN", "PHENOBARB", "VALPROATE", "CBMZ"   .res Assessment: Plan:    Plan:  PDMP reviewed  Lexapro 20mg  daily  Trazadone 100mg  - one tab at hs Concerta 36mg  daily  Using Clonazepam infrequently - none needed today.  Monitor BP between visits while taking stimulant medication. BP today - 128/85/85  Working with Dr Farrel Demark.  Genesight testing available.  RTC 3 months  Patient advised to contact office with any questions, adverse effects, or acute worsening in signs and symptoms.  Discussed potential benefits, risks, and side effects of stimulants with patient to include increased heart rate, palpitations, insomnia, increased anxiety, increased irritability, or decreased appetite.  Instructed patient to contact office if experiencing any significant tolerability issues.   There are no diagnoses linked to this encounter.   Please see After Visit Summary for patient specific instructions.  Future Appointments  Date Time Provider Department Center  07/25/2022  2:40 PM Bethani Brugger, [name], NP [facility] None  08/05/2022  3:00 PM Robley Fries, PhD [facility] None  08/21/2022  3:00 PM Robley Fries, PhD [facility] None  09/03/2022  3:00 PM Robley Fries, PhD [facility] None  09/17/2022  3:00 PM Robley Fries, PhD [facility] None  10/01/2022  3:00 PM Robley Fries, PhD [facility] None  10/15/2022  3:00 PM Robley Fries, PhD [facility] None  02/13/2023  2:00 PM Lomax, Amy, NP [facility] None    No orders of the defined types were placed in this  encounter.   -------------------------------

## 2022-08-05 ENCOUNTER — Ambulatory Visit: Payer: 59 | Admitting: Psychiatry

## 2022-08-06 ENCOUNTER — Ambulatory Visit
Admission: RE | Admit: 2022-08-06 | Discharge: 2022-08-06 | Disposition: A | Discharge status: Home / Self Care | Payer: 59 | Source: Ambulatory Visit | Attending: Family Medicine | Admitting: Family Medicine

## 2022-08-06 ENCOUNTER — Other Ambulatory Visit: Payer: Self-pay | Admitting: Family Medicine

## 2022-08-06 ENCOUNTER — Encounter: Payer: Self-pay | Admitting: Family Medicine

## 2022-08-06 DIAGNOSIS — M25542 Pain in joints of left hand: Secondary | ICD-10-CM

## 2022-08-14 ENCOUNTER — Ambulatory Visit (INDEPENDENT_AMBULATORY_CARE_PROVIDER_SITE_OTHER): Payer: 59 | Admitting: Psychiatry

## 2022-08-14 DIAGNOSIS — F3341 Major depressive disorder, recurrent, in partial remission: Secondary | ICD-10-CM | POA: Diagnosis not present

## 2022-08-14 DIAGNOSIS — F4010 Social phobia, unspecified: Secondary | ICD-10-CM

## 2022-08-14 DIAGNOSIS — G4721 Circadian rhythm sleep disorder, delayed sleep phase type: Secondary | ICD-10-CM | POA: Diagnosis not present

## 2022-08-14 DIAGNOSIS — F900 Attention-deficit hyperactivity disorder, predominantly inattentive type: Secondary | ICD-10-CM

## 2022-08-14 NOTE — Progress Notes (Signed)
Psychotherapy Progress Note [facility] [name], PhD LP  Patient ID: Louis [name] [facility])    MRN: [id number] Therapy format: Individual psychotherapy Date: 08/14/2022      Start: 11:15a     Stop: 12:00n     Time Spent: 45 min Location: In-person   Session narrative (presenting needs, interim history, self-report of stressors and symptoms, applications of prior therapy, status changes, and interventions made in session) Tendinitis in thumb, in immobilizer right now.  Woke up in pain one morning, figures it is related to speed gaming.  Hopeful it won't impair his gaming or piano.  Hand specialist 8/15.  Had a panic attack with mom the other day.  Was occupied with how his hand is affected, felt like she missed the point, started telling him about other people who have it worse, felt like a putdown.  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  Needs naps often now, too, but fears M will wake him if she sees it.  Validated feelings around empathy failure and again feeling too scrutinized.  Reaffirmed that he can take it with a grain of salt, work his own situation, and invite discussion rather than just try to stomach however she'll react.  Discussed policy and approach working out a boundary with mom, and ideas for better energy regulation so he doesn't need the naps.  Has allowed a delayed sleep cycle again, so best done ahead of classes starting back c. 8/20.    GAD-7 = 12, PHQ-9 = 14 today.    Therapeutic modalities: Cognitive Behavioral Therapy, Solution-Oriented/Positive Psychology, and Ego-Supportive  Mental Status/Observations:  Appearance:   Casual     Behavior:  Appropriate  Motor:  Normal  Speech/Language:   Clear and Coherent  Affect:  Appropriate   Mood:  anxious and depressed  Thought process:  normal  Thought content:    WNL  Sensory/Perceptual disturbances:    WNL  Orientation:  Fully oriented  Attention:  Good    Concentration:  Fair  Memory:  WNL  Insight:    Good  Judgment:   Good  Impulse Control:  Fair   Risk Assessment: Danger to Self: No Self-injurious Behavior: No Danger to Others: No Physical Aggression / Violence: No Duty to Warn: No Access to Firearms a concern: No  Assessment of progress:  stabilized  Diagnosis:   ICD-10-CM   1. Major depressive disorder, recurrent, in partial remission (HCC)  F33.41     2. Attention deficit hyperactivity disorder (ADHD), predominantly inattentive type  F90.0     3. Social anxiety disorder  F40.10     4. 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  As needed: Anti-procrastination -- self-remind of his purposes/goals, break down task requirements until he can willing take first step, and if unwilling to put in the work right now, be explicit with self about it Practice identifying tasks to go ahead with and work ahead, self-affirm he'll appreciate it soon enough vs. piling up work Biomedical scientist  self-motivation -- not "trying harder" but "just do ___ then see how it goes".  Don't make it about "having" motivation but about applying the motivation he has for small, doable things to break inertia. Make use of TAs and tutoring PRN, or take the initiative to invite study buddies Where needed and available, supplement with [name] or other available self-instruction Get expert advice on internship timing and process from academic advisor before  deciding mom is right to be alarmed. Make use of university career center to help organize how he looks to the working world and prepares Anxiety/shame management -- Paced breathing prn, walk if restless. Try 2/day mini-meditations or slowdowns to recondition ANS arousal contributing to anxiety and ADHD.  Notice moments of motivational paralysis and treat them same as music mistakes -- "play through", a "note" at a time, with full freedom to acknowledge and emote (e.g., scream internally, permission to say "I'm really uncomfortable" ...) while doing it.  Notice and rethink perfectionistic self-expectations and urges to rehearse self-blame for delayed progress in school. Sleep and circadian management -- Resist staying up too late, accept a reasonable endpoint to the day.  Keep phone out of reach at bed.  Keep CPAP in good order and regular use.  Recommend blue light curfew or use actual orange lenses 1-2 hrs before bed. Metabolic self-regulation and nutrition -- Seek more movement/exercise.  Look into "exercise snacks", especially if they will elevate HR or get winded for a bit.  May do double duty as desensitization to panic.  Continue driving down night snacking.  Continue practice of resisting night eating and doing something else at least 10 minutes, accepting going to bed a little hungry.  Continue shifting to more whole and prepared foods over convenience items.  Option to use MCT oil as the only calories after a food curfew (e.g., 10pm) if munchies are too strong to resist, and "keep real food for real daylight".  Given MTHFR, recommend add B complex supplement or go on and try methylated B12 and folic acid.  MVI probably also worthwhile, given issues and some degraded nutrition, and how supplemented micronutrients may help stem night hunger.   Attention and time awareness v. distraction -- Try 1-minute "listenings" as mindfulness practice.  Option practice estimating 1 minute elapsed without using a  timepiece.  If time blindness in shower or elsewhere, use music or a clock to ground awareness of elapsed time. Communication with parents -- Continue agreement to let good night be good night, without late interruptions or veiled late attempts to motivate or caution.  Cont assert as needed if feedback he gets is unnecessarily guilting, catastrophizing, "dogpiling", or "looping".  For pestering (Mom, mainly), either forgive and take it with a grain of salt, or ask an agreement to be short and blunt rather than prolonged and tutorial, e.g., I'd rather hear "Will you PLEASE take better care of my son?!" than go on about what I should be doing, or how it just takes will power, or I'm not trying hard enough, etc.  Option to ask M to reconsider whether she knows how it feels dealing with an issue she might comment on, as F and B do, but willing to frame it as "It is hard AND I am trying."  Option to notice and thank M for detectable efforts restraining tendencies to project, lecture, or otherwise transfer her distress to him. Social/morale -- Continue exploring friend group, permission to realign and rightsize.  Option to address [name] constructively, just  because he cares.  Option to re-explore dating.  Endorse working with the new car and exploring community of shared interest. Substances -- Reduce/refrain nicotine, recommend abstain from pot, keep alcohol moderate, bring it up if need help changing that Other recommendations/advice -- As may be noted above.  Continue to utilize previously learned skills ad lib. Medication compliance -- Maintain medication as prescribed and work faithfully with relevant prescriber(s) if any changes are desired or seem indicated. Crisis service -- Aware of call list and work-in appts.  Call the clinic on-call service, 988/hotline, 911, or present to [facility] or ER if any life-threatening psychiatric crisis. Followup -- Return for time as already scheduled.  Next scheduled visit with  me 08/21/2022.  Next scheduled in this office 08/21/2022.  Robley Fries, PhD [name], PhD LP Clinical Psychologist, [facility] [address] [place], [place] [address] [phone]

## 2022-08-21 ENCOUNTER — Ambulatory Visit: Payer: 59 | Admitting: Psychiatry

## 2022-08-21 DIAGNOSIS — F900 Attention-deficit hyperactivity disorder, predominantly inattentive type: Secondary | ICD-10-CM

## 2022-08-21 DIAGNOSIS — F3341 Major depressive disorder, recurrent, in partial remission: Secondary | ICD-10-CM

## 2022-08-21 DIAGNOSIS — F4010 Social phobia, unspecified: Secondary | ICD-10-CM

## 2022-08-21 DIAGNOSIS — F411 Generalized anxiety disorder: Secondary | ICD-10-CM

## 2022-08-21 DIAGNOSIS — F509 Eating disorder, unspecified: Secondary | ICD-10-CM

## 2022-08-21 DIAGNOSIS — F331 Major depressive disorder, recurrent, moderate: Secondary | ICD-10-CM | POA: Diagnosis not present

## 2022-08-21 NOTE — Progress Notes (Addendum)
Psychotherapy Progress Note [facility] [name], PhD LP  Patient ID: Louis [name] [facility])    MRN: [id number] Therapy format: Individual psychotherapy Date: 08/21/2022      Start: 3:28p     Stop: 4:07p     Time Spent: 39 min Location: Telehealth visit -- I connected with this patient by an approved telecommunication method (video), with his informed consent, and verifying identity and patient privacy.  I was located at my office and patient at his home.  As needed, we discussed the limitations, risks, and security and privacy concerns associated with telehealth service, including the availability and conditions which currently govern in-person appointments and the possibility that 3rd-party payment may not be fully guaranteed and he may be responsible for charges.  After he indicated understanding, we proceeded with the session.  Also discussed treatment planning, as needed, including ongoing verbal agreement with the plan, the opportunity to ask and answer all questions, his demonstrated understanding of instructions, and his readiness to call the office should symptoms worsen or he feels he is in a crisis state and needs more immediate and tangible assistance.   Session narrative (presenting needs, interim history, self-report of stressors and symptoms, applications of prior therapy, status changes, and interventions made in session) Reached late -- overrun with previous patient, plus Louis [name] was asleep.  Been struggling again with compulsive overeating, nervous about school starting back, more challenging courses, and closer to graduation and its responsibilities.  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 But does want to finish his degree and make it into career life.  Needs to meet with his advisor to recover some  info about internship and planning for applying his degree in the world, recommended he plan time to gt those questions better answered.  Also may need to get reacquainted with the learning center at school and/or find a new TA.  Encouraged to check ahead about TAs for his course and availability of his advisor while he has a little lead time and this much background worry.  Toward this semester, probed interest and willingness in engaging fellow students, also, to liven up the work and stimulate attention and concentration.  Mostly interested, except issues with available time and the ongoing feeling of being more or less aged out -- at 37 -- from the people he normally sees.  Affirmed and encouraged in finding any suitable contacts and being the one to bring up the idea of studying interactively, since noone else will think of it.  Wrist/thumb still affected, enough to substantially interfere with work at [employer].  Idea to call off work but ambivalent it.  Reframed as two "right" answers, just trying to figure out the best "good" -- either go in because it's change of scenery, pay, and participating as team player or call off because it really does hamper the work, early enough that management can adjust.  Therapeutic modalities: Cognitive Behavioral Therapy and Solution-Oriented/Positive Psychology  Mental Status/Observations:  Appearance:   Casual     Behavior:  Drowsy  Motor:  Normal  Speech/Language:   Clear and Coherent  Affect:  Appropriate and Constricted  Mood:  anxious  Thought process:  normal  Thought content:    WNL, worry  Sensory/Perceptual disturbances:    WNL  Orientation:  Fully oriented  Attention:  Good  Concentration:  Good  Memory:  WNL  Insight:    Good  Judgment:   Good  Impulse Control:  Fair   Risk Assessment: Danger to Self: No Self-injurious Behavior: No Danger to Others: No Physical Aggression / Violence: No Duty to Warn: No Access to Firearms a  concern: No  Assessment of progress:  stabilized  Diagnosis:   ICD-10-CM   1. Generalized anxiety disorder  F41.1     2. Major depressive disorder, recurrent episode, moderate (HCC)  F33.1     3. Attention deficit hyperactivity disorder (ADHD), predominantly inattentive type  F90.0     4. Compulsive eating patterns (r/o carb addiction)  F50.9     5. Social anxiety disorder  F40.10      Plan:  Academic stress management going well, more reliable.  As needed: Anti-procrastination -- self-remind of his purposes/goals, break down task requirements until he can willing take first step, and if unwilling to put in the work right now, be explicit with self about it Practice identifying tasks to go ahead with and work ahead, self-affirm he'll appreciate it soon enough vs. piling up work Biomedical scientist self-motivation -- not "trying harder" but "just do ___ then see how it goes".  Don't make it about "having" motivation but about applying the motivation he has for small, doable things to break inertia. Make use of TAs and tutoring PRN, or take the initiative to invite study buddies Where needed and available, supplement with [name] or other available self-instruction Get expert advice on internship timing and process from academic advisor before deciding mom is right to be alarmed. Make use of university career center to help organize how he looks to the working world and prepares Anxiety/shame management -- Paced breathing prn, walk if restless. Try 2/day mini-meditations or slowdowns to recondition ANS arousal contributing to anxiety and ADHD.  Notice moments of motivational paralysis and treat them same as music mistakes -- "play through", a "note" at a time, with full freedom to acknowledge and emote (e.g., scream internally, permission to say "I'm really uncomfortable" ...) while doing it.  Notice and rethink perfectionistic self-expectations and urges to rehearse self-blame for  delayed progress in school. Sleep and circadian management -- Resist staying up too late, accept a reasonable endpoint to the day.  Keep phone out of reach at bed.  Recommend blue light curfew or use actual orange lenses 1-2 hrs before bed.  Keep CPAP in good order and regular use, or reassess need. Metabolic self-regulation and nutrition -- Seek more movement/exercise.  Look into "exercise snacks", especially if they will elevate HR or get winded for a bit.  May do double duty as desensitization to panic.  Continue driving down night snacking.  Continue practice of resisting night eating and doing something else at least 10 minutes, accepting going to bed a little hungry.  Continue shifting to more whole and prepared foods over convenience items.  Option to use MCT oil as the only calories after a food curfew (e.g., 10pm) if munchies are too strong to resist, and "keep real food for real daylight".  Given MTHFR, recommend add B complex supplement or go on and try methylated B12 and folic acid.  MVI probably also worthwhile, given issues and some degraded nutrition, and how supplemented micronutrients may help stem night hunger.  May need general physical to rule out other issues Attention and time awareness v. distraction -- Try 1-minute "listenings" as mindfulness practice.  Option practice estimating 1 minute elapsed  without using a timepiece.  If time blindness in shower or elsewhere, use music or a clock to ground awareness of elapsed time. Communication with parents -- Continue agreement to let good night be good night, without late interruptions or veiled late attempts to motivate or caution.  Cont assert as needed if feedback he gets is unnecessarily guilting, catastrophizing, "dogpiling", or "looping".  For pestering (Mom, mainly), either forgive and take it with a grain of salt, or ask an agreement to be short and blunt rather than prolonged and tutorial, e.g., I'd rather hear "Will you PLEASE take  better care of my son?!" than go on about what I should be doing, or how it just takes will power, or I'm not trying hard enough, etc.  Option to ask M to reconsider whether she knows how it feels dealing with an issue she might comment on, as F and B do, but willing to frame it as "It is hard AND I am trying."  Option to notice and thank M for detectable efforts restraining tendencies to project, lecture, or otherwise transfer her distress to him. Social/morale -- Continue exploring friend group, permission to realign and rightsize.  Option to address [name] constructively, just because he cares.  Option to re-explore dating.  Endorse interests not on screens and exploring communities of shared interest. Substances -- Reduce/refrain nicotine, recommend abstain from pot, keep alcohol moderate, bring it up if need help changing that Other recommendations/advice -- As may be noted above.  Continue to utilize previously learned skills ad lib. Medication compliance -- Maintain medication as prescribed and work faithfully with relevant prescriber(s) if any changes are desired or seem indicated. Crisis service -- Aware of call list and work-in appts.  Call the clinic on-call service, 988/hotline, 911, or present to [facility] or ER if any life-threatening psychiatric crisis. Followup -- Return for time as already scheduled.  Next scheduled visit with me 09/03/2022.  Next scheduled in this office 09/03/2022.  Robley Fries, PhD [name], PhD LP Clinical Psychologist, [facility] [address] [place], [place] [address] [phone]

## 2022-09-03 ENCOUNTER — Ambulatory Visit (INDEPENDENT_AMBULATORY_CARE_PROVIDER_SITE_OTHER): Payer: Self-pay | Admitting: Psychiatry

## 2022-09-03 DIAGNOSIS — Z91199 Patient's noncompliance with other medical treatment and regimen due to unspecified reason: Secondary | ICD-10-CM

## 2022-09-03 NOTE — Progress Notes (Deleted)
Psychotherapy Progress Note [facility] [name], PhD LP  Patient ID: Louis [name])    MRN: [id number] Therapy format: {Therapy Types:21967::"Individual psychotherapy"} Date: 09/03/2022      Start: ***:***     Stop: ***:***     Time Spent: *** min Location: {SvcLoc:22530::"In-person"}   Session narrative (presenting needs, interim history, self-report of stressors and symptoms, applications of prior therapy, status changes, and interventions made in session) ***  Therapeutic modalities: {AM:23362::"Cognitive Behavioral Therapy","Solution-Oriented/Positive Psychology"}  Mental Status/Observations:  Appearance:   {PSY:22683}     Behavior:  {PSY:21022743}  Motor:  {PSY:22302}  Speech/Language:   {PSY:22685}  Affect:  {PSY:22687}  Mood:  {PSY:31886}  Thought process:  {PSY:31888}  Thought content:    {[phone]}  Sensory/Perceptual disturbances:    {PSY:[phone]}  Orientation:  {Psych Orientation:23301::"Fully oriented"}  Attention:  {Good-Fair-Poor ratings:23770::"Good"}    Concentration:  {Good-Fair-Poor ratings:23770::"Good"}  Memory:  {PSY:[phone]}  Insight:    {Good-Fair-Poor ratings:23770::"Good"}  Judgment:   {Good-Fair-Poor ratings:23770::"Good"}  Impulse Control:  {Good-Fair-Poor ratings:23770::"Good"}   Risk Assessment: Danger to Self: {Risk:22599::"No"} Self-injurious Behavior: {Risk:22599::"No"} Danger to Others: {Risk:22599::"No"} Physical Aggression / Violence: {Risk:22599::"No"} Duty to Warn: {AMYesNo:22526::"No"} Access to Firearms a concern: {AMYesNo:22526::"No"}  Assessment of progress:  {Progress:22147::"progressing"}  Diagnosis: No diagnosis found. Plan:  *** Other recommendations/advice -- As may be noted above.  Continue to utilize previously learned skills ad lib. Medication compliance -- Maintain medication as prescribed and work faithfully with relevant prescriber(s) if any changes are desired or seem  indicated. Crisis service -- Aware of call list and work-in appts.  Call the clinic on-call service, 988/hotline, 911, or present to [facility] or ER if any life-threatening psychiatric crisis. Followup -- No follow-ups on file.  Next scheduled visit with me 09/17/2022.  Next scheduled in this office 09/17/2022.  Robley Fries, PhD [name], PhD LP Clinical Psychologist, [facility] [address] [place], [place] [address] [phone]

## 2022-09-03 NOTE — Progress Notes (Signed)
Admin note for non-service contact  Patient ID: Louis [name]  MRN: [id number] DATE: 09/03/2022  NS for 3pm session.  No call received by close of business.  Charge normally.  Followup:  Next scheduled visit with me 09/17/2022.  Next scheduled in this office 09/17/2022.   Robley Fries, PhD [name], PhD LP Clinical Psychologist, [facility] [address] [place], [place] [address] [phone]

## 2022-09-17 ENCOUNTER — Ambulatory Visit (INDEPENDENT_AMBULATORY_CARE_PROVIDER_SITE_OTHER): Payer: 59 | Admitting: Psychiatry

## 2022-09-17 DIAGNOSIS — F411 Generalized anxiety disorder: Secondary | ICD-10-CM | POA: Diagnosis not present

## 2022-09-17 DIAGNOSIS — F3341 Major depressive disorder, recurrent, in partial remission: Secondary | ICD-10-CM | POA: Diagnosis not present

## 2022-09-17 DIAGNOSIS — G4721 Circadian rhythm sleep disorder, delayed sleep phase type: Secondary | ICD-10-CM

## 2022-09-17 DIAGNOSIS — F900 Attention-deficit hyperactivity disorder, predominantly inattentive type: Secondary | ICD-10-CM

## 2022-09-17 DIAGNOSIS — Z720 Tobacco use: Secondary | ICD-10-CM

## 2022-09-17 DIAGNOSIS — F1910 Other psychoactive substance abuse, uncomplicated: Secondary | ICD-10-CM

## 2022-09-17 DIAGNOSIS — F509 Eating disorder, unspecified: Secondary | ICD-10-CM

## 2022-09-17 NOTE — Progress Notes (Signed)
Psychotherapy Progress Note [facility] [name], PhD LP  Patient ID: Louis [name] [facility])    MRN: [id number] Therapy format: Individual psychotherapy Date: 09/17/2022      Start: 3:25p     Stop: 3:57p     Time Spent: 32 min (45+ incl time expected to arrive and working out alternate contact) Location: Telehealth visit -- I connected with this patient by an approved telecommunication method (audio only), with his informed consent, and verifying identity and patient privacy.  I was located at my office and patient at his home.  As needed, we discussed the limitations, risks, and security and privacy concerns associated with telehealth service, including the availability and conditions which currently govern in-person appointments and the possibility that 3rd-party payment may not be fully guaranteed and he may be responsible for charges.  After he indicated understanding, we proceeded with the session.  Also discussed treatment planning, as needed, including ongoing verbal agreement with the plan, the opportunity to ask and answer all questions, his demonstrated understanding of instructions, and his readiness to call the office should symptoms worsen or he feels he is in a crisis state and needs more immediate and tangible assistance.   Session narrative (presenting needs, interim history, self-report of stressors and symptoms, applications of prior therapy, status changes, and interventions made in session) No show 2 wks ago, caught asleep 2 wks before that, no show through 25 min today.  Reached by phone.    Working on quitting nicotine, wearing a patch and vape free a couple days now.  Finally decided it's a waste of time and money.  Decided to take it on after parents mentioned the possibility of insurance rating his benefits based on whether he tests positive, then realized he has enough good reasons.  THC vape is out of the picture, still uses edibles at times, and wisely  does not trust oil-based vapes.  Commended to his discretion and his awareness of adverse effects like memory and motivation.  Schoolwork's been assertive, as predicted, busily engaged, but not overwhelmed.  Hand healed up a couple weeks now.  Has resumed some skateboarding for recreation and exercise.  Continues with [employer], feels it is not too much to manage working and school.  Sleep steady.  Appetite in progress, making more mindful decisions about night eating, among other things swapping peanut butter for whole peanuts as a night snack, to drive down calorie load.  Oriented to mindful eating and encouraged further in getting more experience out of what he eats so it's less about filling up when he doesn't actually need to.  Home/family environment -- Mom's input has been tamer, not much "dogpiling" or "looping", maybe 3/10 these days.  Just an almost cute moment when she heard him tell of bing at the skate park and a car full of teenagers pulled up apparently to smoke weed, and mom compulsively volunteered maybe he shouldn't go there.  Briefly discussed reactions and options to respond if desired, including permission to ask -- nonpejoratively -- if she needs any signs that he is aware and can handle risks like that.    Music experimentation continues, and [name] seems to be doing better himself, less a worry.  Still sees value in collaborative study with other students, and no longer feels too out of place at his age, just OK to do.    Therapeutic modalities: Cognitive Behavioral Therapy, Solution-Oriented/Positive Psychology, and Ego-Supportive  Mental Status/Observations:  Appearance:   Not assessed  Behavior:  Appropriate  Motor:  Not assessed  Speech/Language:   Clear and Coherent  Affect:  Not assessed  Mood:  normal  Thought process:  normal  Thought content:    WNL  Sensory/Perceptual disturbances:    WNL  Orientation:  Fully oriented  Attention:  Good    Concentration:   Good  Memory:  WNL  Insight:    Good  Judgment:   Good  Impulse Control:  Good   Risk Assessment: Danger to Self: No Self-injurious Behavior: No Danger to Others: No Physical Aggression / Violence: No Duty to Warn: No Access to Firearms a concern: No  Assessment of progress:  progressing  Diagnosis:   ICD-10-CM   1. Generalized anxiety disorder  F41.1    improving    2. Major depressive disorder, recurrent, in partial remission (HCC)  F33.41     3. Attention deficit hyperactivity disorder (ADHD), predominantly inattentive type  F90.0     4. 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 Compulsive eating patterns (r/o carb addiction)  F50.9     6. Nicotine use -- working to quit  Z72.0     7. Polysubstance abuse (HCC) - in remission  F19.10    with controlled, minor THC use     Plan:  Academic stress management going well, more reliable.  As needed: Anti-procrastination -- self-remind of his purposes/goals, break down task requirements until he can willing take first step, and if unwilling to put in the work right now, be explicit with self about it Practice identifying tasks to go ahead with and work ahead, self-affirm he'll appreciate it soon enough vs. piling up work Biomedical scientist self-motivation -- not "trying harder" but "just do ___ then see how it goes".  Don't make it about "having" motivation but about applying the motivation he has for small, doable things to break inertia. 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  Notice moments of motivational paralysis and treat them same as music mistakes -- "play through", a "note" at a time, with full freedom to acknowledge and emote (e.g., scream internally, permission to say "I'm really uncomfortable" ...) while doing it.  Notice and rethink perfectionistic self-expectations and urges to rehearse self-blame for delayed progress in school. Sleep and circadian management -- Resist staying up too late, accept a reasonable endpoint to the day.  Keep phone out of reach at bed.  Recommend blue light curfew or use actual orange lenses 1-2 hrs before bed.  Keep CPAP in good order and regular use, or reassess need. Metabolic self-regulation and nutrition -- Seek more movement/exercise.  Look into "exercise snacks", especially if they will elevate HR or get winded for a bit.  May do double duty as desensitization to panic.  Continue driving down night snacking.  Continue practice of resisting night eating and doing something else at least 10 minutes, accepting going to bed a little hungry.  Continue shifting to more whole and prepared foods over convenience items.  Option to use MCT oil as the only calories after a food curfew (e.g., 10pm) if munchies are too strong to resist, and "keep real food for real daylight".  Given MTHFR, recommend add B complex supplement or go on and try methylated B12 and folic acid.  MVI probably also worthwhile, given issues and some degraded nutrition, and how supplemented micronutrients may help stem night hunger.  May need general physical to rule out other issues Attention and time awareness v. distraction -- Try 1-minute "listenings" as mindfulness practice.  Option practice estimating 1 minute elapsed without using a timepiece.  If time blindness in shower or elsewhere, use music or a clock to ground awareness of elapsed time. Communication with parents -- Continue  agreement to let good night be good night, without late interruptions or veiled late attempts to motivate or teach.  Cont to assert as needed if feedback is unnecessarily guilting, catastrophizing, "dogpiling", or "looping".  For pestering, lecturing, or Scientific laboratory technician (Mom, mainly), options to forgive and take it with a grain of salt, or ask an agreement to be short and blunt -- e.g., I'd rather hear "Will you PLEASE take better care of my son?!" -- than go on about what I should be doing, or how it just takes will power, or I'm not trying hard enough, etc.  Option as needed to ask M to reconsider whether she knows how it feels dealing with an issue she comments on, as F and B do, but willing to frame it as "It is hard AND I am trying."  Option to notice and thank M for detectable efforts restraining tendencies, and option to ask her how she would tell if he is well enough aware and capable of managing risks or problems she sees on his behalf. Social/morale -- Continue exploring friend group, permission to realign and rightsize.  Ongoing option to challenge friends if they are harming themselves.  Option to re-explore dating.  Endorse interests not on screens and exploring communities of shared interest. Substances -- Continue smoking cessation, by way of nicotine patch if necessary.  Recommend fully abstain from pot, but acceptable control with current use.  Keep alcohol moderate, bring up if need for further help is warranted. Other recommendations/advice -- As may be noted above.  Continue to utilize previously learned skills ad lib. Medication compliance -- Maintain medication as prescribed and work faithfully with relevant prescriber(s) if any changes are desired or seem indicated. Crisis service -- Aware of call list and work-in appts.  Call the clinic on-call service, 988/hotline, 911, or present to [facility] or ER if any life-threatening psychiatric crisis. Followup -- No follow-ups on file.  Next scheduled  visit with me 10/01/2022.  Next scheduled in this office 10/01/2022.  Robley Fries, PhD [name], PhD LP Clinical Psychologist, [facility] [address], [address] [place], [place] [address] [phone]

## 2022-10-01 ENCOUNTER — Ambulatory Visit (INDEPENDENT_AMBULATORY_CARE_PROVIDER_SITE_OTHER): Payer: 59 | Admitting: Psychiatry

## 2022-10-01 DIAGNOSIS — Z720 Tobacco use: Secondary | ICD-10-CM | POA: Diagnosis not present

## 2022-10-01 DIAGNOSIS — F3341 Major depressive disorder, recurrent, in partial remission: Secondary | ICD-10-CM | POA: Diagnosis not present

## 2022-10-01 DIAGNOSIS — F900 Attention-deficit hyperactivity disorder, predominantly inattentive type: Secondary | ICD-10-CM | POA: Diagnosis not present

## 2022-10-01 DIAGNOSIS — F411 Generalized anxiety disorder: Secondary | ICD-10-CM | POA: Diagnosis not present

## 2022-10-01 DIAGNOSIS — F1910 Other psychoactive substance abuse, uncomplicated: Secondary | ICD-10-CM

## 2022-10-01 NOTE — Progress Notes (Signed)
Psychotherapy Progress Note [facility] [name], PhD LP  Patient ID: Louis [name] [facility])    MRN: [id number] Therapy format: Individual psychotherapy Date: 10/01/2022      Start: 3:08p     Stop: 3:56p     Time Spent: 48 min Location: In-person   Session narrative (presenting needs, interim history, self-report of stressors and symptoms, applications of prior therapy, status changes, and interventions made in session) In the thick of fall semester, busy, may need to adjust his commitments to keep up with homework.  Most likely would drop [employer], not a class.  Nothing getting overwhelming or over his head.  Career fair in a couple days, some worry about how to dress for it, and whether he needs to pull together a resume.  Extracurriculars are pressing, too, with a performance coming and seeing friend from up [place].  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    Has been working on nicotine habit.  Nearly only gum, resists vape cravings.  Has one vape device, in his car, empty, but he still will hit it 3-4 times a day.  Discussed parameters and means to quit entirely.  Therapeutic modalities: Cognitive Behavioral Therapy and Solution-Oriented/Positive Psychology  Mental Status/Observations:  Appearance:   Casual     Behavior:  Appropriate  Motor:  Normal  Speech/Language:   Clear and Coherent  Affect:  Appropriate  Mood:  anxious  Thought process:  normal  Thought content:    WNL  Sensory/Perceptual disturbances:    WNL  Orientation:  Fully oriented  Attention:  Good    Concentration:  Good  Memory:  WNL  Insight:    Good  Judgment:   Good  Impulse Control:  Good    Risk Assessment: Danger to Self: No Self-injurious Behavior: No Danger to Others: No Physical Aggression / Violence: No Duty to Warn: No Access to Firearms a concern: No  Assessment of progress:  progressing  Diagnosis:   ICD-10-CM   1. Generalized anxiety disorder  F41.1     2. Major depressive disorder, recurrent, in partial remission (HCC)  F33.41     3. Attention deficit hyperactivity disorder (ADHD), predominantly inattentive type  F90.0     4. Nicotine use -- working to quit  Z72.0     5. Polysubstance abuse (HCC) - in remission  F19.10      Plan:  Academic stress management going well, more reliable.  As needed: Anti-procrastination -- self-remind of his purposes/goals, break down task requirements until he can willing take first step, and if unwilling to put in the work right now, be explicit with self about it Practice identifying tasks to go ahead with and work ahead, self-affirm he'll appreciate it soon enough vs. piling up work Biomedical scientist self-motivation -- not "trying harder" but "just do ___ then see how it goes".  Don't make it about "having" motivation but about applying the motivation he has for small, doable things to break inertia. 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  Notice moments of motivational paralysis and treat them same as music mistakes -- "play through", a "note" at a time, with full freedom to acknowledge and emote (e.g., scream internally,  permission to say "I'm really uncomfortable" ...) while doing it.  Notice and rethink perfectionistic self-expectations and urges to rehearse self-blame for delayed progress in school. Sleep and circadian management -- Resist staying up too late, accept a reasonable endpoint to the day.  Keep phone out of reach at bed.  Recommend blue light curfew or use actual orange lenses 1-2 hrs before bed.  Keep CPAP in good order and regular use, or reassess need. Metabolic self-regulation and nutrition -- Seek more movement/exercise.  Look into "exercise snacks", especially if they will elevate HR or get winded for a bit.  May do double duty as desensitization to panic.  Continue driving down night snacking.  Continue practice of resisting night eating and doing something else at least 10 minutes, accepting going to bed a little hungry.  Continue shifting to more whole and prepared foods over convenience items.  Option to use MCT oil as the only calories after a food curfew (e.g., 10pm) if munchies are too strong to resist, and "keep real food for real daylight".  Given MTHFR, recommend add B complex supplement or go on and try methylated B12 and folic acid.  MVI probably also worthwhile, given issues and some degraded nutrition, and how supplemented micronutrients may help stem night hunger.  May need general physical to rule out other issues Attention and time awareness v. distraction -- Try 1-minute "listenings" as mindfulness practice.  Option practice estimating 1 minute elapsed without using a timepiece.  If time blindness in shower or elsewhere, use music or a clock to ground awareness of elapsed time. Communication with parents -- Continue agreement to let good night be good night, without late interruptions or veiled late attempts to motivate or teach.  Cont to assert as needed if feedback is unnecessarily guilting, catastrophizing, "dogpiling", or "looping".  For pestering, lecturing, or Scientific laboratory technician (Mom, mainly),  options to forgive and take it with a grain of salt, or ask an agreement to be short and blunt -- e.g., I'd rather hear "Will you PLEASE take better care of my son?!" -- than go on about what I should be doing, or how it just takes will power, or I'm not trying hard enough, etc.  Option as needed to ask M to reconsider whether she knows how it feels dealing with an issue she comments on, as F and B do, but willing to frame it as "It is hard AND I am trying."  Option to notice and thank M for detectable efforts restraining tendencies, and option to ask her how she would tell if he is well enough aware and capable of managing risks or problems she sees on his behalf. Social/morale -- Continue exploring friend group, permission to realign and rightsize.  Ongoing option to challenge friends if they are harming themselves.  Option to re-explore dating.  Endorse interests not on screens and exploring communities of shared interest. Substances -- Continue smoking cessation, by way of nicotine gum or patch if necessary, but preferably weaning readily.  Recommend fully abstain from pot, but acceptable  control with current use.  Keep alcohol moderate, bring up if need for further help is warranted. Other recommendations/advice -- As may be noted above.  Continue to utilize previously learned skills ad lib. Medication compliance -- Maintain medication as prescribed and work faithfully with relevant prescriber(s) if any changes are desired or seem indicated. Crisis service -- Aware of call list and work-in appts.  Call the clinic on-call service, 988/hotline, 911, or present to [facility] or ER if any life-threatening psychiatric crisis. Followup -- Return for time as already scheduled.  Next scheduled visit with me 10/15/2022.  Next scheduled in this office 10/15/2022.  Robley Fries, PhD [name], PhD LP Clinical Psychologist, [facility] [address], [address] [place], [place] [address] [phone]

## 2022-10-15 ENCOUNTER — Ambulatory Visit (INDEPENDENT_AMBULATORY_CARE_PROVIDER_SITE_OTHER): Payer: 59 | Admitting: Psychiatry

## 2022-10-15 DIAGNOSIS — F411 Generalized anxiety disorder: Secondary | ICD-10-CM | POA: Diagnosis not present

## 2022-10-15 DIAGNOSIS — F3341 Major depressive disorder, recurrent, in partial remission: Secondary | ICD-10-CM | POA: Diagnosis not present

## 2022-10-15 DIAGNOSIS — F900 Attention-deficit hyperactivity disorder, predominantly inattentive type: Secondary | ICD-10-CM | POA: Diagnosis not present

## 2022-10-15 NOTE — Progress Notes (Signed)
Psychotherapy Progress Note [facility] 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  Accepted and reframed, encouraged him to separate issues and suspend self-judgment actively, focus more on taking action steps to relieve the pressure and work the problems.  Still reserving the option to quit or reduce job.  Encouraged try again finding active students to compare notes with and ask questions of, because the social commitment will keep him more diligent and the interaction more alert and focused.  Helped decatastrophize by comparing similarities and differences now vs when he went through his extended panic and breakdown earlier at [facility].  Says he's been trying to skateboard some, not just for recreation and actual exercise but also to face fears.  Still freezes up some.  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  Says mother overreacted to the story urging him to just not go there.  Good practice going to the career fair, got some good orientation to available experiences, better equipped to apply for them as the time comes.  Therapeutic modalities: Cognitive Behavioral Therapy, Solution-Oriented/Positive Psychology, and Ego-Supportive  Mental Status/Observations:  Appearance:   Casual     Behavior:   Appropriate  Motor:  Normal  Speech/Language:   Clear and Coherent  Affect:  Appropriate  Mood:  anxious  Thought process:  normal  Thought content:    WNL  Sensory/Perceptual disturbances:    WNL  Orientation:  Fully oriented  Attention:  Good    Concentration:  Fair  Memory:  WNL  Insight:    Good  Judgment:   Good  Impulse Control:  Good   Risk Assessment: Danger to Self: No Self-injurious Behavior: No Danger to Others: No Physical Aggression / Violence: No Duty to Warn: No Access to Firearms a concern: No  Assessment of progress:  stabilized  Diagnosis:   ICD-10-CM   1. Generalized anxiety disorder  F41.1     2. Major depressive disorder, recurrent, in partial remission (HCC)  F33.41     3. Attention deficit hyperactivity disorder (ADHD), predominantly inattentive type  F90.0      Plan:  Academic stress management going well, more reliable.  As needed: Anti-procrastination -- self-remind of his purposes/goals, break down task requirements until he can willing take first step, and if unwilling to put in the work right now, be explicit with self about it Practice identifying tasks to go ahead with and work ahead, self-affirm he'll appreciate it soon enough vs. anxiety from piling up work Biomedical scientist self-motivation -- not "trying harder" but "just do ___ then see how it goes".  Don't make it about "having" motivation but about applying the motivation he has for small, doable things to break inertia. 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  Notice moments of motivational paralysis and treat them same as music mistakes -- "play through", a "note" at a time, with full freedom to acknowledge and emote (e.g., scream internally, permission to say "I'm really uncomfortable" ...) while doing it.  Notice and rethink perfectionistic self-expectations and urges to rehearse self-blame for delayed progress in school. Sleep and circadian management -- Resist staying up too late, accept a reasonable endpoint to the day.  Keep phone out of reach at bed.  Recommend blue light curfew or use actual orange lenses 1-2 hrs before bed.  Keep CPAP in good order and regular use, or reassess need. Metabolic self-regulation and nutrition -- Seek more movement/exercise.  Look into "exercise snacks", especially if they will elevate HR or get winded for a bit.  May do double duty as desensitization to panic.  Continue driving down night snacking.  Continue practice of resisting night eating and doing something else at least 10 minutes, accepting going to bed a little hungry.  Continue shifting to more whole and prepared foods over convenience items.  Option to use MCT oil as the only calories after a food curfew (e.g., 10pm) if munchies are too strong to resist, and "keep real food for real daylight".  Given MTHFR, recommend add B complex supplement or go on and try methylated B12 and folic acid.  MVI probably also worthwhile, given issues and some degraded nutrition, and how supplemented micronutrients may help stem night hunger.  May need general physical to rule out other issues Attention and time awareness v. distraction -- Try 1-minute "listenings" as mindfulness practice.  Option practice estimating 1 minute elapsed without using a timepiece.  If time blindness in shower or elsewhere, use music or a clock  to ground awareness of elapsed time. Communication with parents -- Continue agreement to let good night be good night, without late interruptions or veiled late attempts to motivate or teach.  Cont to assert as needed if feedback is unnecessarily guilting, catastrophizing, "dogpiling", or "looping".  For pestering, lecturing, or Scientific laboratory technician (Mom, mainly), options to forgive and take it with a grain of salt, or ask an agreement to be short and blunt -- e.g., I'd rather hear "Will you PLEASE take better care of my son?!" -- than go on about what I should be doing, or how it just takes will power, or I'm not trying hard enough, etc.  Option as needed to ask M to reconsider whether she knows how it feels dealing with an issue she comments on, as F and B do, but willing to frame it as "It is hard AND I am trying."  Option to notice and thank M for detectable efforts restraining tendencies, and option to ask her how she would tell if he is well enough aware and capable of managing risks or problems she sees on his behalf. Social/morale -- Continue exploring friend group, permission to realign and rightsize.  Ongoing option to challenge friends if they are harming themselves.  Option to re-explore dating.  Endorse interests not on screens and exploring communities of shared interest. Substances --  Continue smoking cessation, by way of nicotine gum or patch if necessary, but preferably weaning readily.  Recommend fully abstain from pot, but acceptable control with current use.  Keep alcohol moderate, bring up if need for further help is warranted. Other recommendations/advice -- As may be noted above.  Continue to utilize previously learned skills ad lib. Medication compliance -- Maintain medication as prescribed and work faithfully with relevant prescriber(s) if any changes are desired or seem indicated. Crisis service -- Aware of call list and work-in appts.  Call the clinic on-call service, 988/hotline, 911, or  present to [facility] or ER if any life-threatening psychiatric crisis. Followup -- Return for time as already scheduled.  Next scheduled visit with me 10/29/2022.  Next scheduled in this office 10/25/2022.  Robley Fries, PhD [name], PhD LP Clinical Psychologist, [facility] [address] [place], [place] [address] [phone]

## 2022-10-25 ENCOUNTER — Ambulatory Visit (INDEPENDENT_AMBULATORY_CARE_PROVIDER_SITE_OTHER): Payer: Self-pay | Admitting: Adult Health

## 2022-10-25 DIAGNOSIS — Z0389 Encounter for observation for other suspected diseases and conditions ruled out: Secondary | ICD-10-CM

## 2022-10-25 NOTE — Progress Notes (Signed)
Patient no show appointment. ? ?

## 2022-10-29 ENCOUNTER — Ambulatory Visit: Payer: 59 | Admitting: Psychiatry

## 2022-10-31 ENCOUNTER — Encounter: Payer: Self-pay | Admitting: Adult Health

## 2022-10-31 ENCOUNTER — Ambulatory Visit (INDEPENDENT_AMBULATORY_CARE_PROVIDER_SITE_OTHER): Payer: 59 | Admitting: Adult Health

## 2022-10-31 DIAGNOSIS — F900 Attention-deficit hyperactivity disorder, predominantly inattentive type: Secondary | ICD-10-CM | POA: Diagnosis not present

## 2022-10-31 DIAGNOSIS — F331 Major depressive disorder, recurrent, moderate: Secondary | ICD-10-CM | POA: Diagnosis not present

## 2022-10-31 DIAGNOSIS — F411 Generalized anxiety disorder: Secondary | ICD-10-CM | POA: Diagnosis not present

## 2022-10-31 MED ORDER — METHYLPHENIDATE HCL ER (OSM) 36 MG PO TBCR: 36.0000 mg | EXTENDED_RELEASE_TABLET | Freq: Every day | ORAL | 0 refills | Status: DC

## 2022-10-31 MED ORDER — ESCITALOPRAM OXALATE 20 MG PO TABS
20.0000 mg | ORAL_TABLET | Freq: Every day | ORAL | 5 refills | Status: DC
Start: 2022-10-31 — End: 2023-09-25

## 2022-10-31 MED ORDER — METHYLPHENIDATE HCL ER (OSM) 36 MG PO TBCR
36.0000 mg | EXTENDED_RELEASE_TABLET | Freq: Every day | ORAL | 0 refills | Status: DC
Start: 2022-11-28 — End: 2023-02-13

## 2022-10-31 MED ORDER — METHYLPHENIDATE HCL ER (OSM) 36 MG PO TBCR
36.0000 mg | EXTENDED_RELEASE_TABLET | Freq: Every day | ORAL | 0 refills | Status: DC
Start: 2022-10-31 — End: 2023-02-13

## 2022-10-31 NOTE — Progress Notes (Signed)
Louis [name] [id number] [birth year] 27 y.o.  Subjective:   Patient ID:  Louis [name] is a 27 y.o. (DOB Aug 03, 1995) male.  Chief Complaint: No chief complaint on file.   HPI Louis [name] presents to the office today for follow-up of MDD, ADHD, GAD.  Describes mood today as "ok". Pleasant. Denies tearfulness. Mood symptoms - reports "some" depression and anxiety - "more school related". Reports some irritability - "trying to quit nicotine". Denies recent panic attacks. Reports some worry, ruminations, and over thinking - "school stuff - future". Mood is stable. Stating "I feel like I'm doing ok for the most part". Feels like medications are helpful. Living at home with parents. Stable interest and motivation. Taking medications as prescribed.  Energy levels stable. Active, has a regular exercise routine x 2 days a week. Enjoys some usual interests and activities. Single. Not dating. Lives with parents and cat. Spending time with family and friends. Appetite adequate. Weight gain - 230 pounds. Sleeps well most nights. Averages 6 to 8 hours. Focus and concentration stable. Completing tasks. Managing aspects of household. Taking classes - [employer] - 16 hours. 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  Denies SI or HI.  Denies AH or VH. Substance use - vapes nicotine. Uses THC - sparingly. ETOH use - sparingly.    GAD-7    Flowsheet Row Counselor from 08/14/2022 in [facility] Counselor from 03/16/2021 in [facility]  Total GAD-7 Score 12 14      PHQ2-9    Flowsheet Row Counselor from 08/14/2022 in [facility] Counselor from 03/16/2021 in [facility] Counselor from 05/03/2020 in [facility]  PHQ-2 Total Score 4 5 5   PHQ-9 Total Score 14 21 20         Review of Systems:  Review of Systems  Musculoskeletal:  Negative for gait  problem.  Neurological:  Negative for tremors.  Psychiatric/Behavioral:         Please refer to HPI    Medications: I have reviewed the patient's current medications.  Current Outpatient Medications  Medication Sig Dispense Refill   escitalopram (LEXAPRO) 20 MG tablet Take 1 tablet (20 mg total) by mouth daily. 30 tablet 5   methylphenidate (CONCERTA) 36 MG PO CR tablet Take 1 tablet (36 mg total) by mouth daily. 30 tablet 0   methylphenidate (CONCERTA) 36 MG PO CR tablet Take 1 tablet (36 mg total) by mouth daily. 30 tablet 0   methylphenidate (CONCERTA) 36 MG PO CR tablet Take 1 tablet (36 mg total) by mouth daily. 30 tablet 0   traZODone (DESYREL) 100 MG tablet Take one tablet daily. (Patient not taking: Reported on 08/14/2022) 30 tablet 5   No current facility-administered medications for this visit.    Medication Side Effects: None  Allergies: No Known Allergies  Past Medical History:  Diagnosis Date   ADHD    Anxiety and depression     Past Medical History, Surgical history, Social history, and Family history were reviewed and updated as appropriate.   Please see review of systems for further details on the patient's review from today.   Objective:   Physical Exam:  There were no vitals taken for this visit.  Physical Exam Constitutional:      General: He is not in acute distress. Musculoskeletal:        General: No deformity.  Neurological:     Mental Status: He  is alert and oriented to person, place, and time.     Coordination: Coordination normal.  Psychiatric:        Attention and Perception: Attention and perception normal. He does not perceive auditory or visual hallucinations.        Mood and Affect: Affect is not labile, blunt, angry or inappropriate.        Speech: Speech normal.        Behavior: Behavior normal.        Thought Content: Thought content normal. Thought content is not paranoid or delusional. Thought content does not include homicidal or  suicidal ideation. Thought content does not include homicidal or suicidal plan.        Cognition and Memory: Cognition and memory normal.        Judgment: Judgment normal.     Comments: Insight intact     Lab Review:  No results found for: "NA", "K", "CL", "CO2", "GLUCOSE", "BUN", "CREATININE", "CALCIUM", "PROT", "ALBUMIN", "AST", "ALT", "ALKPHOS", "BILITOT", "GFRNONAA", "GFRAA"  No results found for: "WBC", "RBC", "HGB", "HCT", "PLT", "MCV", "MCH", "MCHC", "RDW", "LYMPHSABS", "MONOABS", "EOSABS", "BASOSABS"  No results found for: "POCLITH", "LITHIUM"   No results found for: "PHENYTOIN", "PHENOBARB", "VALPROATE", "CBMZ"   .res Assessment: Plan:    Plan:  PDMP reviewed  Lexapro 20mg  daily  Trazadone 100mg  - one tab at hs Concerta 36mg  daily  Using Clonazepam infrequently - none needed today.  Monitor BP between visits while taking stimulant medication. BP today - 119/82/89  Working with Dr Farrel Demark.  Genesight testing available.  RTC 3 months  Patient advised to contact office with any questions, adverse effects, or acute worsening in signs and symptoms.  Discussed potential benefits, risks, and side effects of stimulants with patient to include increased heart rate, palpitations, insomnia, increased anxiety, increased irritability, or decreased appetite.  Instructed patient to contact office if experiencing any significant tolerability issues.  There are no diagnoses linked to this encounter.   Please see After Visit Summary for patient specific instructions.  Future Appointments  Date Time Provider Department Center  10/31/2022  3:00 PM Kendon Sedeno, [name], NP [facility] None  11/18/2022  2:00 PM Robley Fries, PhD [facility] None  12/09/2022  3:00 PM Robley Fries, PhD [facility] None  12/25/2022  3:00 PM Robley Fries, PhD [facility] None  12/30/2022  3:00 PM Robley Fries, PhD [facility] None  01/20/2023  3:00 PM Robley Fries, PhD CP[facility] None  02/13/2023  2:00 PM Lomax, Amy,  NP [facility] None    No orders of the defined types were placed in this encounter.   -------------------------------

## 2022-11-18 ENCOUNTER — Ambulatory Visit (INDEPENDENT_AMBULATORY_CARE_PROVIDER_SITE_OTHER): Payer: 59 | Admitting: Psychiatry

## 2022-11-18 DIAGNOSIS — F900 Attention-deficit hyperactivity disorder, predominantly inattentive type: Secondary | ICD-10-CM | POA: Diagnosis not present

## 2022-11-18 DIAGNOSIS — F4010 Social phobia, unspecified: Secondary | ICD-10-CM

## 2022-11-18 DIAGNOSIS — F509 Eating disorder, unspecified: Secondary | ICD-10-CM

## 2022-11-18 DIAGNOSIS — F3341 Major depressive disorder, recurrent, in partial remission: Secondary | ICD-10-CM | POA: Diagnosis not present

## 2022-11-18 DIAGNOSIS — F411 Generalized anxiety disorder: Secondary | ICD-10-CM | POA: Diagnosis not present

## 2022-11-18 NOTE — Progress Notes (Signed)
Psychotherapy Progress Note [facility] [name], PhD LP  Patient ID: Louis [name] [facility])    MRN: [id number] Therapy format: Individual psychotherapy Date: 11/18/2022      Start: 2:22p     Stop: 3:08p     Time Spent: 46 min Location: In-person   Session narrative (presenting needs, interim history, self-report of stressors and symptoms, applications of prior therapy, status changes, and interventions made in session) 20 min late, been vomiting 3x today.  Suspects food poisoning, but not sure.  28 days into a dietary change -- B and F have each lost 50+ lbs before, plus he's noticed a stomach fold coming on, a girl may be interested, and he'd like to slim down.  Mostly calorie counting, reducing portions.  Finding it's cheaper to eat out, and nicer to have more control.  More aware of how foods rate, using a fitness app (My Fitness Pal).  Some anxiety about the girl and whether she's giving him an opening.  Encouraged to explore, stay authentic, try not to worry too hard about how to be.  Music scene is developing, been asked to perform a few times.  Still enjoyable so far.  Academics are working out, feels in the swing of his studies.  Probably his last semester to take a full load, actually, figures spring and next fall lighter loads and then completing his capstone project.  Affirmed and encouraged in working out his coursework and lining up internship.  Discussed goals.  Overall, plans plans to continue academic progress as doing now and progress in his weight loss program.  Therapeutic modalities: Cognitive Behavioral Therapy, Solution-Oriented/Positive Psychology, and Ego-Supportive  Mental Status/Observations:  Appearance:   Casual     Behavior:  Appropriate  Motor:  Normal  Speech/Language:   Clear and Coherent  Affect:  Appropriate  Mood:  Some anxiety  Thought process:  normal  Thought content:    WNL  Sensory/Perceptual disturbances:    WNL   Orientation:  Fully oriented  Attention:  Good    Concentration:  Good  Memory:  WNL  Insight:    Good  Judgment:   Good  Impulse Control:  Good   Risk Assessment: Danger to Self: No Self-injurious Behavior: No Danger to Others: No Physical Aggression / Violence: No Duty to Warn: No Access to Firearms a concern: No  Assessment of progress:  progressing  Diagnosis:   ICD-10-CM   1. Major depressive disorder, recurrent, in partial remission (HCC)  F33.41     2. Generalized anxiety disorder  F41.1     3. Attention deficit hyperactivity disorder (ADHD), predominantly inattentive type  F90.0     4. Compulsive eating patterns (r/o carb addiction)  F50.9     5. Social anxiety disorder  F40.10      Plan:  Academic stress management going well, more reliable.  As needed: Anti-procrastination -- self-remind of his purposes/goals, break down task requirements until he can willing take first step, and if unwilling to put in the work right now, be explicit with self about it Practice identifying tasks to go ahead with and work ahead, self-affirm he'll appreciate it soon enough vs. anxiety from piling up work Biomedical scientist self-motivation -- not "trying harder" but "just do ___ then see how it goes".  Don't make it about "having" motivation but about applying the motivation he has for small, doable things to break inertia. 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  Notice moments of motivational paralysis and treat them same as music mistakes -- "play through", a "note" at a time, with full freedom to acknowledge and emote (e.g., scream internally, permission to say "I'm really uncomfortable" ...) while doing it.  Notice and rethink perfectionistic self-expectations and urges to rehearse self-blame for delayed progress in school. Sleep and circadian management -- Resist staying up too late, accept a reasonable endpoint to the day.  Keep phone out of reach at bed.  Recommend blue light curfew or use actual orange lenses 1-2 hrs before bed.  Keep CPAP in good order and regular use, or reassess need. Metabolic self-regulation and nutrition -- Support in weight loss and overall healthy orientation.  Seek more movement/exercise.  Look into "exercise snacks", especially if they will elevate HR or get winded for a bit.  May do double duty as desensitization to panic.  Continue driving down night snacking.  Continue practice of resisting night eating and doing something else at least 10 minutes, accepting going to bed a little hungry.  Continue shifting to more whole and prepared foods over convenience items.  Option to use MCT oil as the only calories after a food curfew (e.g., 10pm) if munchies are too strong to resist, and "keep real food for real daylight".  Given MTHFR, recommend add B complex supplement or go on and try methylated B12 and folic acid.  MVI probably also worthwhile, given issues and some degraded nutrition, and how supplemented micronutrients may help stem night hunger.  May need a general physical to rule out other issues. Attention and time awareness v. distraction -- Try 1-minute "listenings" as mindfulness practice.  Option practice estimating 1 minute elapsed without using a timepiece.  If time blindness in shower or elsewhere, use music or a clock to ground awareness of  elapsed time. Communication with parents -- Continue agreement to let good night be good night, without late interruptions or veiled late attempts to motivate or teach.  Cont to assert as needed if feedback is unnecessarily guilting, catastrophizing, "dogpiling", or "looping".  For pestering, lecturing, or Scientific laboratory technician (Mom, mainly), options to forgive and take it with a grain of salt, or ask an agreement to be short and blunt -- e.g., I'd rather hear "Will you PLEASE take better care of my son?!" -- than go on about what I should be doing, or how it just takes will power, or I'm not trying hard enough, etc.  Option as needed to ask M to reconsider whether she knows how it feels dealing with an issue she comments on, as F and B do, but willing to frame it as "It is hard AND I am trying."  Option to notice and thank M for detectable efforts restraining tendencies, and option to ask her how she would tell if he is well enough aware and capable of managing risks or problems she sees on his behalf. Social/morale -- Continue exploring friend group, permission to realign and rightsize.  Ongoing option to challenge friends if they  are harming themselves.  Option to re-explore dating.  Endorse interests not on screens and exploring communities of shared interest. Substances -- Continue smoking cessation, by way of nicotine gum or patch if necessary, but preferably weaning readily.  Recommend fully abstain from pot, but acceptable control with current use.  Keep alcohol moderate, bring up if need for further help is warranted. Other recommendations/advice -- As may be noted above.  Continue to utilize previously learned skills ad lib. Medication compliance -- Maintain medication as prescribed and work faithfully with relevant prescriber(s) if any changes are desired or seem indicated. Crisis service -- Aware of call list and work-in appts.  Call the clinic on-call service, 988/hotline, 911, or present to [facility] or ER if any  life-threatening psychiatric crisis. Followup -- Return for time as already scheduled.  Next scheduled visit with me 12/09/2022.  Next scheduled in this office 12/09/2022.  Robley Fries, PhD [name], PhD LP Clinical Psychologist, [facility] [address] [place], [place] [address] [phone]

## 2022-12-09 ENCOUNTER — Ambulatory Visit: Payer: 59 | Admitting: Psychiatry

## 2022-12-25 ENCOUNTER — Ambulatory Visit: Payer: 59 | Admitting: Psychiatry

## 2022-12-25 NOTE — Progress Notes (Deleted)
Psychotherapy Progress Note [facility] [name], PhD LP  Patient ID: Ferman Umberger [facility])    MRN: [id number] Therapy format: Individual psychotherapy Date: 12/25/2022      Start: 3:30p     Stop: ***:***     Time Spent: *** min Location: Telehealth visit -- I connected with this patient by an approved telecommunication method (audio only), with his informed consent, and verifying identity and patient privacy.  I was located at my office and patient at his home.  As needed, we discussed the limitations, risks, and security and privacy concerns associated with telehealth service, including the availability and conditions which currently govern in-person appointments and the possibility that 3rd-party payment may not be fully guaranteed and he may be responsible for charges.  After he indicated understanding, we proceeded with the session.  Also discussed treatment planning, as needed, including ongoing verbal agreement with the plan, the opportunity to ask and answer all questions, his demonstrated understanding of instructions, and his readiness to call the office should symptoms worsen or he feels he is in a crisis state and needs more immediate and tangible assistance.   Session narrative (presenting needs, interim history, self-report of stressors and symptoms, applications of prior therapy, status changes, and interventions made in session) Last seen 11/11, was ill att he time byt succeeding in diet change and weight loss.  Noshow 12/2, no message noted.  Has not appeared by 27 min into appt time, call made to personal cell phone.  Says not been getting reminders from the system, which has his mother's number as his cell phone.    Stress right now of trying to find internships.  Mother is putting on some pressure, and he admittedly is resistant to looking into internships.  Has a couple of good resources.    Feeling wishes also for better set of friends, and to  reestablish out side of his parents' home.  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  Existential anxiety about further losses (e.g., potential for his cat to die, seeing parents transition toward retirement in a couple months and )  Therapeutic modalities: {AM:23362::"Cognitive Behavioral Therapy","Solution-Oriented/Positive Psychology"}  Mental Status/Observations:  Appearance:   {PSY:22683}     Behavior:  {PSY:21022743}  Motor:  {PSY:22302}  Speech/Language:   {PSY:22685}  Affect:  {PSY:22687}  Mood:  {PSY:31886}  Thought process:  {LOV:56433}  Thought content:    {PSY:[phone]}  Sensory/Perceptual disturbances:    {[phone]}  Orientation:  {Psych Orientation:23301::"Fully oriented"}  Attention:  {Good-Fair-Poor ratings:23770::"Good"}    Concentration:  {Good-Fair-Poor ratings:23770::"Good"}  Memory:  {PSY:2154513559}  Insight:    {Good-Fair-Poor ratings:23770::"Good"}  Judgment:   {Good-Fair-Poor ratings:23770::"Good"}  Impulse Control:  {Good-Fair-Poor ratings:23770::"Good"}   Risk Assessment: Danger to Self: {Risk:22599::"No"} Self-injurious Behavior: {Risk:22599::"No"} Danger to Others: {Risk:22599::"No"} Physical Aggression / Violence: {Risk:22599::"No"} Duty to Warn: {AMYesNo:22526::"No"} Access to Firearms a concern: {AMYesNo:22526::"No"}  Assessment of progress:  {Progress:22147::"progressing"}  Diagnosis: No diagnosis found. Plan:  *** Other recommendations/advice -- As may be noted above.  Continue to utilize previously learned skills ad lib. Medication compliance -- Maintain medication as prescribed and work faithfully with relevant prescriber(s) if any changes are desired or seem indicated. Crisis service -- Aware of call list and work-in appts.  Call the clinic on-call service, 988/hotline, 911, or present to [facility] or ER if any life-threatening psychiatric crisis. Followup -- No follow-ups on file.  Next scheduled  visit with me 12/30/2022.  Next scheduled in this office 12/30/2022.  Robley Fries, PhD [name], PhD LP Clinical Psychologist, [facility] [address], [address] [place], [place] [address] [phone]

## 2022-12-30 ENCOUNTER — Ambulatory Visit: Payer: 59 | Admitting: Psychiatry

## 2023-01-20 ENCOUNTER — Ambulatory Visit: Payer: 59 | Admitting: Psychiatry

## 2023-02-07 ENCOUNTER — Ambulatory Visit (INDEPENDENT_AMBULATORY_CARE_PROVIDER_SITE_OTHER): Payer: 59 | Admitting: Psychiatry

## 2023-02-07 DIAGNOSIS — F900 Attention-deficit hyperactivity disorder, predominantly inattentive type: Secondary | ICD-10-CM

## 2023-02-07 DIAGNOSIS — F1910 Other psychoactive substance abuse, uncomplicated: Secondary | ICD-10-CM

## 2023-02-07 DIAGNOSIS — F3341 Major depressive disorder, recurrent, in partial remission: Secondary | ICD-10-CM | POA: Diagnosis not present

## 2023-02-07 DIAGNOSIS — F411 Generalized anxiety disorder: Secondary | ICD-10-CM

## 2023-02-07 DIAGNOSIS — F4010 Social phobia, unspecified: Secondary | ICD-10-CM | POA: Diagnosis not present

## 2023-02-07 DIAGNOSIS — F509 Eating disorder, unspecified: Secondary | ICD-10-CM

## 2023-02-07 NOTE — Progress Notes (Signed)
Psychotherapy Progress Note [facility] [name], PhD LP  Patient ID: Louis [name] [facility])    MRN: [id number] Therapy format: Individual psychotherapy Date: 02/07/2023      Start: 10:16a     Stop: 11:03a     Time Spent: 47 min Location: In-person   Session narrative (presenting needs, interim history, self-report of stressors and symptoms, applications of prior therapy, status changes, and interventions made in session) Out of service about 10 weeks.  "Doing OK."  Sprained his ankle, recovering.  Re school, the fall semester turned out, got through the stress of finals, made the grades he intended, about 2/3 As.  Has made progress contacting internship opportunities for this summer.  Still anxiety-provoking some with school, but seems to be faithful to his tasks.  Might have fallen off some socially in recent times but has gotten back with [facility] friends a bit.  Been spending more intense game time speed running [employer].    May still struggle with boundaries in the house, as parents may walk in his room without notice.  More concerned with what parents may think of his gaming and whether it's a waste of time, or a risk of addiction, etc.  Can feel double minded about the worth of playing video games.  Challenged to be willing to talk it over more frankly with parents if he's sure that's their opinion, talk it over with himself if it's his own self-criticism, and stay willing to approach it realistically whether it's escapism and avoiding things that intimidate him or a harmless pastime.  Reiterated encouragement to speak up if he wants to arrange boundaries further in the house, or start progressing to living back out on his own.  Therapeutic modalities: Cognitive Behavioral Therapy, Solution-Oriented/Positive Psychology, and Ego-Supportive  Mental Status/Observations:  Appearance:   Casual     Behavior:  Appropriate  Motor:  Normal  Speech/Language:   Clear and  Coherent  Affect:  Appropriate  Mood:  anxious  Thought process:  normal  Thought content:    WNL  Sensory/Perceptual disturbances:    WNL  Orientation:  Fully oriented  Attention:  Good    Concentration:  Good  Memory:  WNL  Insight:    Good  Judgment:   Good  Impulse Control:  Variable   Risk Assessment: Danger to Self: No Self-injurious Behavior: No Danger to Others: No Physical Aggression / Violence: No Duty to Warn: No Access to Firearms a concern: No  Assessment of progress:  stabilized  Diagnosis:   ICD-10-CM   1. Major depressive disorder, recurrent, in partial remission (HCC)  F33.41     2. Generalized anxiety disorder  F41.1     3. Attention deficit hyperactivity disorder (ADHD), predominantly inattentive type  F90.0     4. Social anxiety disorder  F40.10     5. Compulsive eating patterns (r/o carb addiction)  F50.9     6. Polysubstance abuse (HCC) - in remission  F19.10      Plan:  Academic stress management going well, more reliable.  As needed: Anti-procrastination -- self-remind of his purposes/goals, break down task requirements until he can willing take first step, and if unwilling to put in the work right now, be explicit with self about it Practice identifying tasks to go ahead with and work ahead, self-affirm he'll appreciate it soon enough vs. anxiety from piling up work Biomedical scientist self-motivation -- not "trying harder" but "just do ___ then see how it goes".  Don't  make it about "having" motivation but about applying the motivation he has for small, doable things to break inertia. 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  Notice moments of motivational paralysis and treat them same as music mistakes -- "play through", a "note" at a time, with full freedom to acknowledge and emote (e.g., scream internally, permission to say "I'm really uncomfortable" ...) while doing it.  Notice and rethink perfectionistic self-expectations and urges to rehearse self-blame for delayed progress in school. Sleep and circadian management -- Resist staying up too late, accept a reasonable endpoint to the day.  Keep phone out of reach at bed.  Recommend blue light curfew or use actual orange lenses 1-2 hrs before bed.  Keep CPAP in good order and regular use, or reassess need. Metabolic self-regulation and nutrition -- Support in weight loss and overall healthy orientation.  Seek more movement/exercise.  Look into "exercise snacks", especially if they will elevate HR or get winded for a bit.  May do double duty as desensitization to panic.  Continue driving down night snacking.  Continue practice of resisting night eating and doing something else at least 10 minutes, accepting going to bed a little hungry.  Continue shifting to more whole and prepared foods over convenience items.  Option to use MCT oil as the only calories after a food curfew (e.g., 10pm) if munchies are too strong to resist, and "keep real food for real daylight".  Given MTHFR, recommend add B complex supplement or go on and try methylated B12 and folic acid.  MVI probably also worthwhile, given issues and some degraded nutrition, and how supplemented micronutrients may help stem night hunger.  May need a general physical to rule out other issues. Attention and time awareness v. distraction -- Try  1-minute "listenings" as mindfulness practice.  Option practice estimating 1 minute elapsed without using a timepiece.  If time blindness in shower or elsewhere, use music or a clock to ground awareness of elapsed time. Communication with parents -- Continue agreement to let good night be good night, without late interruptions or veiled late attempts to motivate or teach.  Cont to assert as needed if feedback is unnecessarily guilting, catastrophizing, "dogpiling", or "looping".  For pestering, lecturing, or Scientific laboratory technician (Mom, mainly), options to forgive and take it with a grain of salt, or ask an agreement to be short and blunt -- e.g., I'd rather hear "Will you PLEASE take better care of my son?!" -- than go on about what I should be doing, or how it just takes will power, or I'm not trying hard enough, etc.  Option as needed to ask M to reconsider whether she knows how it feels dealing with an issue she comments on, as F and B do, but willing to frame it as "It is hard AND I am trying."  Option to notice and thank M for detectable efforts restraining tendencies, and option to ask her how she would tell if he is  well enough aware and capable of managing risks or problems she sees on his behalf. Social/morale -- Continue exploring friend group, permission to realign and rightsize.  Ongoing option to challenge friends if they are harming themselves.  Option to re-explore dating.  Endorse interests not on screens and exploring communities of shared interest. Substances -- Continue smoking cessation, by way of nicotine gum or patch if necessary, but preferably weaning readily.  Recommend fully abstain from pot, but acceptable control with current use.  Keep alcohol moderate, bring up if need for further help is warranted. Other recommendations/advice -- As may be noted above.  Continue to utilize previously learned skills ad lib. Medication compliance -- Maintain medication as prescribed and work faithfully with  relevant prescriber(s) if any changes are desired or seem indicated. Crisis service -- Aware of call list and work-in appts.  Call the clinic on-call service, 988/hotline, 911, or present to [facility] or ER if any life-threatening psychiatric crisis. Followup -- Return for time as already scheduled.  Next scheduled visit with me 02/19/2023.  Next scheduled in this office 02/19/2023.  Robley Fries, PhD [name], PhD LP Clinical Psychologist, [facility] [address], [address] [place], [place] [address] [phone]

## 2023-02-10 NOTE — Patient Instructions (Addendum)
 Let me know if you change your mind about using CPAP. Consider dental device. Continue to focus on weight management.
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
## 2023-02-12 ENCOUNTER — Telehealth: Payer: Self-pay

## 2023-02-12 NOTE — Telephone Encounter (Signed)
LVM for pt to bring his CPAP Machine with him to his Appointment tomorrow.

## 2023-02-13 ENCOUNTER — Ambulatory Visit (INDEPENDENT_AMBULATORY_CARE_PROVIDER_SITE_OTHER): Payer: 59 | Admitting: Family Medicine

## 2023-02-13 ENCOUNTER — Encounter: Payer: Self-pay | Admitting: Family Medicine

## 2023-02-13 VITALS — BP 104/71 | HR 71 | Ht 70.0 in | Wt 240.0 lb

## 2023-02-13 DIAGNOSIS — G4733 Obstructive sleep apnea (adult) (pediatric): Secondary | ICD-10-CM | POA: Diagnosis not present

## 2023-02-19 ENCOUNTER — Ambulatory Visit (INDEPENDENT_AMBULATORY_CARE_PROVIDER_SITE_OTHER): Payer: 59 | Admitting: Psychiatry

## 2023-02-19 DIAGNOSIS — Z91199 Patient's noncompliance with other medical treatment and regimen due to unspecified reason: Secondary | ICD-10-CM

## 2023-02-21 NOTE — Progress Notes (Signed)
Admin note for non-service contact  Patient ID: Louis [name]  MRN: [id number] DATE: 02/19/2023  No show for 9am session.  Chrage normally, RS as able.  Robley Fries, PhD [name], PhD LP Clinical Psychologist, [facility] [address] [place], [place] [address] [phone]

## 2023-03-06 ENCOUNTER — Ambulatory Visit: Payer: 59 | Admitting: Psychiatry

## 2023-03-21 ENCOUNTER — Ambulatory Visit (INDEPENDENT_AMBULATORY_CARE_PROVIDER_SITE_OTHER): Payer: 59 | Admitting: Psychiatry

## 2023-03-21 DIAGNOSIS — F3341 Major depressive disorder, recurrent, in partial remission: Secondary | ICD-10-CM

## 2023-03-21 DIAGNOSIS — G4721 Circadian rhythm sleep disorder, delayed sleep phase type: Secondary | ICD-10-CM | POA: Diagnosis not present

## 2023-03-21 DIAGNOSIS — F411 Generalized anxiety disorder: Secondary | ICD-10-CM | POA: Diagnosis not present

## 2023-03-21 DIAGNOSIS — F900 Attention-deficit hyperactivity disorder, predominantly inattentive type: Secondary | ICD-10-CM

## 2023-03-21 NOTE — Progress Notes (Signed)
 Psychotherapy Progress Note [facility] [name], PhD LP Patient ID: Louis [name] [facility])    MRN: [id number] Therapy format: Individual psychotherapy Date: 03/21/2023      Start: 2:10p     Stop: 2:55p     Time Spent: 45 min Location: In-person   Session narrative (presenting needs, interim history, self-report of stressors and symptoms, applications of prior therapy, status changes, and interventions made in session) Back after absence, says he and M have spoken about his need for time management.  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  Verified that a D still counts for credit and this course isn't a C-minimum prerequisite for anything, just good to make the effort and clear a C.  Probed whether he would like representation for an accommodation to re-do a submission that he didn't understand how to make, but declines at this point, just wants to press through with the opportunity he has and take his lessons about how sometimes there are no second chances.  Back to distraction problem, brainstormed ways to focus and sustain effort through at least short periods trying to focus.  Therapeutic modalities: Cognitive Behavioral Therapy and Solution-Oriented/Positive Psychology  Mental Status/Observations:  Appearance:   Casual     Behavior:  Appropriate  Motor:  Normal  Speech/Language:   Clear and Coherent  Affect:  Appropriate and Constricted  Mood:  anxious  Thought process:  normal  Thought content:    WNL  Sensory/Perceptual disturbances:    WNL  Orientation:  Fully oriented  Attention:  Good    Concentration:  Fair  Memory:  WNL  Insight:    Good  Judgment:   Good  Impulse Control:  Good   Risk Assessment: Danger to Self: No Self-injurious Behavior: No Danger to Others: No Physical Aggression / Violence: No Duty to Warn: No Access  to Firearms a concern: No  Assessment of progress:  progressing  Diagnosis:   ICD-10-CM   1. Major depressive disorder, recurrent, in partial remission (HCC)  F33.41     2. Generalized anxiety disorder  F41.1     3. Attention deficit hyperactivity disorder (ADHD), predominantly inattentive type  F90.0     4. 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 anxiety from piling up work Biomedical scientist self-motivation -- not "trying harder" but "just do ___ then see how it goes".  Don't make it about "having" motivation but about applying the motivation he has for small, doable things to break inertia. 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  Notice moments of motivational paralysis and treat them same as music mistakes -- "play through", a "note" at a time, with full freedom to acknowledge and emote (e.g., scream internally, permission to say "I'm really uncomfortable" ...) while doing it.  Notice and rethink  perfectionistic self-expectations and urges to rehearse self-blame for delayed progress in school. Sleep and circadian management -- Resist staying up too late, accept a reasonable endpoint to the day.  Keep phone out of reach at bed.  Recommend blue light curfew or use actual orange lenses 1-2 hrs before bed.  Keep CPAP in good order and regular use, or reassess need. Metabolic self-regulation and nutrition -- Support in weight loss and overall healthy orientation.  Seek more movement/exercise.  Look into "exercise snacks", especially if they will elevate HR or get winded for a bit.  May do double duty as desensitization to panic.  Continue driving down night snacking.  Continue practice of resisting night eating and doing something else at least 10 minutes, accepting going to bed a little hungry.  Continue shifting to more whole and prepared foods over convenience items.  Option to use MCT oil as the only calories after a food curfew (e.g., 10pm) if munchies are too strong to resist, and "keep real food for real daylight".  Given MTHFR, recommend add B complex supplement or go on and try methylated B12 and folic acid.  MVI probably also worthwhile, given issues and some degraded nutrition, and how supplemented micronutrients may help stem night hunger.  May need a general physical to rule out other issues. Attention and time awareness v. distraction -- Try 1-minute "listenings" as mindfulness practice.  Option practice estimating 1 minute elapsed without using a timepiece.  If time blindness in shower or elsewhere, use music or a clock to ground awareness of elapsed time. Communication with parents -- Continue agreement to let good night be good night, without late interruptions or veiled late attempts to motivate or teach.  Cont to assert as needed if feedback is unnecessarily guilting, catastrophizing, "dogpiling", or "looping".  For pestering, lecturing, or Scientific laboratory technician (Mom, mainly), options to forgive and  take it with a grain of salt, or ask an agreement to be short and blunt -- e.g., I'd rather hear "Will you PLEASE take better care of my son?!" -- than go on about what I should be doing, or how it just takes will power, or I'm not trying hard enough, etc.  Option as needed to ask M to reconsider whether she knows how it feels dealing with an issue she comments on, as F and B do, but willing to frame it as "It is hard AND I am trying."  Option to notice and thank M for detectable efforts restraining tendencies, and option to ask her how she would tell if he is well enough aware and capable of managing risks or problems she sees on his behalf. Social/morale -- Continue exploring friend group, permission to realign and rightsize.  Ongoing option to challenge friends if they are harming themselves.  Option to re-explore dating.  Endorse interests not on screens and exploring communities of shared interest. Substances -- Continue smoking cessation, by way of nicotine gum or patch if necessary, but preferably weaning readily.  Recommend fully abstain from pot, but acceptable control with current use.  Keep alcohol moderate, bring up if need for further help is warranted. Other recommendations/advice -- As may be noted above.  Continue to utilize previously learned skills ad lib. Medication compliance -- Maintain medication as prescribed and work faithfully with relevant prescriber(s) if any changes are desired or seem indicated. Crisis service -- Aware of call list and work-in appts.  Call the clinic on-call service, 988/hotline, 911, or present to [facility] or ER if any life-threatening psychiatric crisis. Followup -- Return for time as already scheduled.  Next scheduled visit with me 04/04/2023.  Next scheduled in this office 04/04/2023.  Robley Fries, PhD [name], PhD LP Clinical Psychologist, [facility] [address], [address] [place], [place]  [address] [phone]

## 2023-04-04 ENCOUNTER — Ambulatory Visit (INDEPENDENT_AMBULATORY_CARE_PROVIDER_SITE_OTHER): Payer: 59 | Admitting: Psychiatry

## 2023-04-04 DIAGNOSIS — G4721 Circadian rhythm sleep disorder, delayed sleep phase type: Secondary | ICD-10-CM | POA: Diagnosis not present

## 2023-04-04 DIAGNOSIS — F411 Generalized anxiety disorder: Secondary | ICD-10-CM | POA: Diagnosis not present

## 2023-04-04 DIAGNOSIS — F900 Attention-deficit hyperactivity disorder, predominantly inattentive type: Secondary | ICD-10-CM

## 2023-04-04 DIAGNOSIS — F3341 Major depressive disorder, recurrent, in partial remission: Secondary | ICD-10-CM | POA: Diagnosis not present

## 2023-04-04 DIAGNOSIS — Z1589 Genetic susceptibility to other disease: Secondary | ICD-10-CM

## 2023-04-04 NOTE — Progress Notes (Signed)
 Psychotherapy Progress Note [facility] [name], PhD LP  Patient ID: Louis [name] [facility])    MRN: [id number] Therapy format: Individual psychotherapy Date: 04/04/2023      Start: 10:18a     Stop: 11:06a     Time Spent: 48 min Location: In-person   Session narrative (presenting needs, interim history, self-report of stressors and symptoms, applications of prior therapy, status changes, and interventions made in session) Dad retiring soon, parents got an RV, so facing some more alone times, like all week this week.  Neighbor woman in her 56s, whom he liked and would cat sit for (was going to this weekend) passed out and died last night.  Another neighbor, not known so well passed away not that long ago.  Thought-provoking with M 26yo now, sparks wondering what it will be like when she passes.  Discussed inclinations to convey his own condolences before parents get home.  M's advice to give them time, but encouraged his call, brief drop by or call vs. give them time.  Re his low grade in the one class, got 1 assignment graded (38/40), awaiting the next one, does feel he knows the material well enough.  Has decided not to press the case about resubmitting an old assignment.  Doing some brain dumping and list making to get hold of crowded thoughts and organization.  Finds it is harder to be alert and organized when alone.  Just cat Cozart, who is on the affectionate end of the spectrum, which helps with anxiety and loneliness.  Facing some hard assignments this weekend, so far look manageable.  Background anxiety about internships, has taken the step of booking a career coach at [employer].  Has put out a number of applications, but mostly no response.  Not exactly demoralizing, but at least a drag.  Father gets irritated on his behalf about the erosion of business etiquette not replying or informing applicants.  Validated and supported in making the effort.  Encouraged networking  where available, and support seeing the career coach for any other insider advice on how to break through.  Therapeutic modalities: Cognitive Behavioral Therapy, Solution-Oriented/Positive Psychology, and Ego-Supportive  Mental Status/Observations:  Appearance:   Casual     Behavior:  Appropriate  Motor:  Normal  Speech/Language:   Clear and Coherent  Affect:  Appropriate and Constricted  Mood:  dysthymic  Thought process:  normal  Thought content:    WNL  Sensory/Perceptual disturbances:    WNL  Orientation:  Fully oriented  Attention:  Good    Concentration:  Fair  Memory:  WNL  Insight:    Good  Judgment:   Good  Impulse Control:  Good   Risk Assessment: Danger to Self: No Self-injurious Behavior: No Danger to Others: No Physical Aggression / Violence: No Duty to Warn: No Access to Firearms a concern: No  Assessment of progress:  progressing  Diagnosis:   ICD-10-CM   1. Major depressive disorder, recurrent, in partial remission (HCC)  F33.41     2. Generalized anxiety disorder  F41.1     3. Attention deficit hyperactivity disorder (ADHD), predominantly inattentive type  F90.0     4. 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 MTHFR gene mutation  Z15.89      Plan:  Academic stress management -- as needed: Anti-procrastination -- self-remind of his purposes/goals, break down task requirements until he can willing take first step, and if unwilling to put in  the work right now, be explicit with self about it Practice identifying tasks to go ahead with and work ahead, self-affirm he'll appreciate it soon enough vs. anxiety from piling up work Biomedical scientist self-motivation -- not "trying harder" but "just do ___ then see how it goes".  Don't make it about "having" motivation but about applying the motivation he has for small, doable things to break inertia. 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  Notice moments of motivational paralysis and treat them same as music mistakes -- "play through", a "note" at a time, with full freedom to acknowledge and emote (e.g., scream internally, permission to say "I'm really uncomfortable" ...) while doing it.  Notice and rethink perfectionistic self-expectations and urges to rehearse self-blame for delayed progress in school. Sleep and circadian management -- Resist staying up too late, accept a reasonable endpoint to the day.  Keep phone out of reach at bed.  Recommend blue light curfew or use actual orange lenses 1-2 hrs before bed.  Keep CPAP in good order and regular use, or reassess need. Metabolic self-regulation and nutrition -- Support in weight loss and overall healthy orientation.  Seek more movement/exercise.  Look into "exercise snacks", especially if they will elevate HR or get winded for a bit.  May do double duty as desensitization to panic.  Continue driving down night snacking.  Continue practice of resisting night eating and doing something else at least 10 minutes, accepting going to bed a little hungry.  Continue shifting to more whole and prepared foods over convenience items.  Option to use MCT oil as the only calories after a food curfew (e.g., 10pm) if munchies are too strong to resist, and "keep real food for real daylight".  Given MTHFR, recommend add B complex supplement or go  on and try methylated B12 and folic acid.  MVI probably also worthwhile, given issues and some degraded nutrition, and how supplemented micronutrients may help stem night hunger.  May need a general physical to rule out other issues. Attention and time awareness v. distraction -- Try 1-minute "listenings" as mindfulness practice.  Option practice estimating 1 minute elapsed without using a timepiece.  If time blindness in shower or elsewhere, use music or a clock to ground awareness of elapsed time. Communication with parents -- Continue agreement to let good night be good night, without late interruptions or veiled late attempts to motivate or teach.  Cont to assert as needed if feedback is unnecessarily guilting, catastrophizing, "dogpiling", or "looping".  For pestering, lecturing, or Scientific laboratory technician (Mom, mainly), options to forgive and take it with a grain of salt, or ask an agreement to be short and blunt -- e.g., I'd rather hear "Will you PLEASE take better care of my son?!" -- than go on about what I should be doing, or how it just takes will power, or I'm not trying hard enough, etc.  Option as needed to ask M to reconsider whether she knows how it  feels dealing with an issue she comments on, as F and B do, but willing to frame it as "It is hard AND I am trying."  Option to notice and thank M for detectable efforts restraining tendencies, and option to ask her how she would tell if he is well enough aware and capable of managing risks or problems she sees on his behalf. Social/morale -- Continue exploring friend group, permission to realign and rightsize.  Ongoing option to challenge friends if they are harming themselves.  Option to re-explore dating.  Endorse interests not on screens and exploring communities of shared interest. Substances -- Continue smoking cessation, by way of nicotine gum or patch if necessary, but preferably weaning readily.  Recommend fully abstain from pot, but acceptable control  with current use.  Keep alcohol moderate, bring up if need for further help is warranted. Other recommendations/advice -- As may be noted above.  Continue to utilize previously learned skills ad lib. Medication compliance -- Maintain medication as prescribed and work faithfully with relevant prescriber(s) if any changes are desired or seem indicated. Crisis service -- Aware of call list and work-in appts.  Call the clinic on-call service, 988/hotline, 911, or present to [facility] or ER if any life-threatening psychiatric crisis. Followup -- Return for time as already scheduled.  Next scheduled visit with me 04/18/2023.  Next scheduled in this office 04/18/2023.  Robley Fries, PhD [name], PhD LP Clinical Psychologist, [facility] [address], [address] [place], [place] [address] [phone]

## 2023-04-18 ENCOUNTER — Ambulatory Visit (INDEPENDENT_AMBULATORY_CARE_PROVIDER_SITE_OTHER): Payer: 59 | Admitting: Psychiatry

## 2023-04-18 DIAGNOSIS — F3341 Major depressive disorder, recurrent, in partial remission: Secondary | ICD-10-CM

## 2023-04-18 DIAGNOSIS — G4721 Circadian rhythm sleep disorder, delayed sleep phase type: Secondary | ICD-10-CM

## 2023-04-18 DIAGNOSIS — F900 Attention-deficit hyperactivity disorder, predominantly inattentive type: Secondary | ICD-10-CM | POA: Diagnosis not present

## 2023-04-18 DIAGNOSIS — F411 Generalized anxiety disorder: Secondary | ICD-10-CM

## 2023-04-18 NOTE — Progress Notes (Signed)
 Psychotherapy Progress Note [facility] [name], PhD LP  Patient ID: Louis [name] [facility])    MRN: [id number] Therapy format: Individual psychotherapy Date: 04/18/2023      Start: 10:07a     Stop: 10:54a     Time Spent: 47 min Location: In-person   Session narrative (presenting needs, interim history, self-report of stressors and symptoms, applications of prior therapy, status changes, and interventions made in session) Last session noting existential stresses with unexpected deaths, noticing parents' age, facing prospect of more time alone in the house as F prepares to retire, and life transition anxiety about securing an internship and advancing to career, alongside the demoralizing influence of not hearing back from internship applications.  As of today, his D grade has rebounded, as professor has graded backlog and scored 100 on an assignment, now averaging 77, with opportunity to reach a B (was 69, D).    Some issues with intrusive thoughts and existential dread.  Attributes to the near-60yo neighbor passing, and 2 yrs ago losing 2 very old cats.  Can worry about brother's health.  M & F have good habits, less stimulus to worry.  Encouraged to journal about it, if only to externalize, slow down, get himself heard, and anti-hypnotize his worry.  Option also to write out pro/con as to whether a catastrophic thought is likely to come true.  Walked through an example with the idea of cat chewing a cable and electrocuting itself (something that happened to brother).  Can think about turning 32 and not being in a career or relationship.  Probed whether he has enough companionship in his life, people who can hear out things when he wants to hash them out.  F, B yes, M still too much risk of taking over or directing.  Therapeutic modalities: Cognitive Behavioral Therapy, Solution-Oriented/Positive Psychology, Ego-Supportive, and Humanistic/Existential  Mental  Status/Observations:  Appearance:   Casual     Behavior:  Appropriate  Motor:  Normal  Speech/Language:   Clear and Coherent  Affect:  Appropriate  Mood:  dysthymic  Thought process:  normal  Thought content:    WNL  Sensory/Perceptual disturbances:    WNL  Orientation:  Fully oriented  Attention:  Good    Concentration:  Fair  Memory:  WNL  Insight:    Good  Judgment:   Good  Impulse Control:  Fair   Risk Assessment: Danger to Self: No Self-injurious Behavior: No Danger to Others: No Physical Aggression / Violence: No Duty to Warn: No Access to Firearms a concern: No  Assessment of progress:  stabilized  Diagnosis:   ICD-10-CM   1. Major depressive disorder, recurrent, in partial remission (HCC)  F33.41     2. Generalized anxiety disorder  F41.1     3. Attention deficit hyperactivity disorder (ADHD), predominantly inattentive type  F90.0     4. 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 anxiety from piling up work Biomedical scientist self-motivation -- not trying harder but just do ___ then see how it goes.  Don't make it about having motivation but about applying the motivation he has for  small, doable things to break inertia. 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  Notice moments of motivational paralysis and treat them same as music mistakes -- play through, a note at a time, with full freedom to acknowledge and emote (e.g., scream internally, permission to say I'm really uncomfortable ...) while doing it.  Notice and rethink perfectionistic self-expectations and urges to rehearse self-blame for delayed progress in school.  As for existential issues, acknowledge then see through to action toward something meaningful, and count it good. Sleep and circadian management -- Resist staying up too late, accept a reasonable endpoint to the day.  Keep phone out of reach at bed.  Recommend blue light curfew or use actual orange lenses 1-2 hrs before bed.  Keep CPAP in good order and regular use, or reassess need. Metabolic self-regulation and nutrition -- Support in weight loss and overall healthy orientation.  Seek more movement/exercise.  Look into exercise snacks, especially if they will elevate HR or get winded for a bit.  May do double duty as desensitization to panic.  Continue driving down night snacking.  Continue practice of resisting night eating and doing something else at least 10 minutes, accepting going to bed a little hungry.  Continue shifting to more whole and prepared foods over convenience items.  Option to use MCT oil as the only calories after a food curfew (e.g., 10pm) if munchies are too strong to resist, and keep real food for real daylight.  Given MTHFR, recommend add B complex supplement or go on and try methylated B12 and folic acid.  MVI probably also worthwhile, given issues and some degraded nutrition, and how supplemented micronutrients may help stem night  hunger.  May need a general physical to rule out other issues. Attention and time awareness v. distraction -- Try 1-minute listenings as mindfulness practice.  Option practice estimating 1 minute elapsed without using a timepiece.  If time blindness in shower or elsewhere, use music or a clock to ground awareness of elapsed time. Communication with parents -- Continue agreement to let good night be good night, without late interruptions or veiled late attempts to motivate or teach.  Cont to assert as needed if feedback is unnecessarily guilting, catastrophizing, dogpiling, or looping.  For pestering, lecturing, or Scientific laboratory technician (Mom, mainly), options to forgive and take it with a grain of salt, or ask an agreement to be short and blunt -- e.g., I'd rather hear Will you PLEASE take better care of my son?! -- than go on about what I should be doing, or how it just takes will power, or I'm not trying hard enough, etc.  Option as needed to ask M to reconsider whether she knows how it feels dealing with an issue she comments on, as F and B do, but willing to frame it as It is hard AND I am trying.  Option to notice and thank M for detectable efforts restraining tendencies, and option to ask her how she would  tell if he is well enough aware and capable of managing risks or problems she sees on his behalf. Social/morale -- Continue exploring friend group, permission to realign and rightsize.  Ongoing option to challenge friends if they are harming themselves.  Option to re-explore dating.  Endorse interests not on screens and exploring communities of shared interest. Substances -- Continue smoking cessation, by way of nicotine gum or patch if necessary, but preferably weaning readily.  Recommend fully abstain from pot, but acceptable control with current use.  Keep alcohol moderate, bring up if need for further help is warranted. Other recommendations/advice -- As may be noted above.  Continue to utilize  previously learned skills ad lib. Medication compliance -- Maintain medication as prescribed and work faithfully with relevant prescriber(s) if any changes are desired or seem indicated. Crisis service -- Aware of call list and work-in appts.  Call the clinic on-call service, 988/hotline, 911, or present to [facility] or ER if any life-threatening psychiatric crisis. Followup -- Return for time as already scheduled.  Next scheduled visit with me 05/02/2023.  Next scheduled in this office 05/02/2023.  Lamar Kendall, PhD [name], PhD LP Clinical Psychologist, [facility] [address] [place], [place] [address] [phone]

## 2023-05-02 ENCOUNTER — Ambulatory Visit (INDEPENDENT_AMBULATORY_CARE_PROVIDER_SITE_OTHER): Payer: 59 | Admitting: Psychiatry

## 2023-05-02 DIAGNOSIS — F3341 Major depressive disorder, recurrent, in partial remission: Secondary | ICD-10-CM | POA: Diagnosis not present

## 2023-05-02 DIAGNOSIS — F900 Attention-deficit hyperactivity disorder, predominantly inattentive type: Secondary | ICD-10-CM | POA: Diagnosis not present

## 2023-05-02 DIAGNOSIS — F411 Generalized anxiety disorder: Secondary | ICD-10-CM

## 2023-05-02 DIAGNOSIS — G4721 Circadian rhythm sleep disorder, delayed sleep phase type: Secondary | ICD-10-CM

## 2023-05-02 NOTE — Progress Notes (Signed)
 Psychotherapy Progress Note [facility] Louis Kendall, PhD LP  Patient ID: Louis [name])    MRN: [id number] Therapy format: Individual psychotherapy Date: 05/02/2023      Start: 11:17a     Stop: 12:03p     Time Spent: 46 min Location: In-person   Session narrative (presenting needs, interim history, self-report of stressors and symptoms, applications of prior therapy, status changes, and interventions made in session) Doing OK.  This morning started off irritated by a lame group chat.  Exams week after next, doing another stint alone in the house as parents travel, no good word yet on internships.  As it happens, more stressed, procrastinates.  Facilitated imagining himself opening, approaching, and engaging a computer programming task today, after this meeting.  Dealt with choke points including having too many ideas come up and the tediousness of writing up a report on it, separating subtasks.  Offered paradoxical ideas for resistance, including decisively refusing (temporarily) to do the task (writing, e.g.,) and to write wrong.  Also hyperbolic expression, telling the cat about how he feels, and straight moral support texting his father.  Nobody else really to choose from.  Affirmed and encouraged.  Therapeutic modalities: Cognitive Behavioral Therapy, Solution-Oriented/Positive Psychology, and Ego-Supportive  Mental Status/Observations:  Appearance:   Casual     Behavior:  Appropriate  Motor:  Normal  Speech/Language:   Clear and Coherent  Affect:  Appropriate  Mood:  dysthymic  Thought process:  normal  Thought content:    WNL  Sensory/Perceptual disturbances:    WNL  Orientation:  Fully oriented  Attention:  Good    Concentration:  Fair  Memory:  WNL  Insight:    Good  Judgment:   Good  Impulse Control:  Fair   Risk Assessment: Danger to Self: No Self-injurious Behavior: No Danger to Others: No Physical Aggression / Violence: No Duty to Warn:  No Access to Firearms a concern: No  Assessment of progress:  stabilized  Diagnosis:   ICD-10-CM   1. Major depressive disorder, recurrent, in partial remission (HCC)  F33.41     2. Generalized anxiety disorder  F41.1     3. Attention deficit hyperactivity disorder (ADHD), predominantly inattentive type  F90.0     4. 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 anxiety from piling up work Biomedical scientist self-motivation -- not trying harder but just do ___ then see how it goes.  Don't make it about having motivation but about applying the motivation he has for small, doable things to break inertia. May use paradoxical motivation -- express aloud not wanting to, tell the cat about it -- or moral support of father or a friend of choice Take the initiative to invite and establish study buddies, for activation, alertness, accountability, and enriched perspective Make use of TAs and tutoring PRN Where needed and available, supplement with [employer] or other available self-instruction Get expert advice on internship timing and process from academic advisor Make use of university career [name] to help organize how he looks to the working world with internship search and prepares to market himself as a  new worker Anxiety/shame management -- As needed, use paced breathing, walking, 2/day mini-meditations or slowdowns to reduce ANS arousal contributing to anxiety and ADHD.  Notice moments of motivational paralysis and treat them same as music mistakes -- play through, a note at a time, with full freedom  to acknowledge and emote (e.g., scream internally, permission to say I'm really uncomfortable ...) while doing it.  Notice and rethink perfectionistic self-expectations and urges to rehearse self-blame for delayed progress in school.  As for existential issues, acknowledge then see through to action toward something meaningful, and count it good. Sleep and circadian management -- Resist staying up too late, accept a reasonable endpoint to the day.  Keep phone out of reach at bed.  Recommend blue light curfew or use actual orange lenses 1-2 hrs before bed.  Keep CPAP in good order and regular use, or reassess need. Metabolic self-regulation and nutrition -- Support in weight loss and overall healthy orientation.  Seek more movement/exercise.  Look into exercise snacks, especially if they will elevate HR or get winded for a bit.  May do double duty as desensitization to panic.  Continue driving down night snacking.  Continue practice of resisting night eating and doing something else at least 10 minutes, accepting going to bed a little hungry.  Continue shifting to more whole and prepared foods over convenience items.  Option to use MCT oil as the only calories after a food curfew (e.g., 10pm) if munchies are too strong to resist, and keep real food for real daylight.  Given MTHFR, recommend add B complex supplement or go on and try methylated B12 and folic acid.  MVI probably also worthwhile, given issues and some degraded nutrition, and how supplemented micronutrients may help stem night hunger.  May need a general physical to rule out other issues. Attention and time awareness v. distraction -- Try 1-minute listenings as mindfulness practice.  Option practice estimating 1 minute elapsed without using a timepiece.  If time blindness in shower or elsewhere, use music or a clock to ground awareness of elapsed time. Communication with parents -- Continue agreement to let good night be good night, without  late interruptions or veiled late attempts to motivate or teach.  Cont to assert as needed if feedback is unnecessarily guilting, catastrophizing, dogpiling, or looping.  For pestering, lecturing, or Scientific laboratory technician (Mom, mainly), options to forgive and take it with a grain of salt, or ask an agreement to be short and blunt -- e.g., I'd rather hear Will you PLEASE take better care of my son?! -- than go on about what I should be doing, or how it just takes will power, or I'm not trying hard enough, etc.  Option as needed to ask M to reconsider whether she knows how it feels dealing with an issue she comments on, as F and B do, but willing to frame it as It is hard AND I am trying.  Option to notice and thank M for detectable efforts restraining tendencies, and option to ask her how she would tell if he is well enough aware and capable of managing risks or problems she sees on his behalf. Social/morale -- Continue exploring friend group, permission to realign and rightsize.  Ongoing option to challenge friends if they are harming themselves.  Option to re-explore dating.  Endorse interests not on screens and exploring communities of shared interest. Substances -- Continue smoking cessation, by way of nicotine gum or patch if necessary, but preferably weaning readily.  Recommend  fully abstain from pot, but acceptable control with current use.  Keep alcohol moderate, bring up if need for further help is warranted. Other recommendations/advice -- As may be noted above.  Continue to utilize previously learned skills ad lib. Medication compliance -- Maintain medication as prescribed and work faithfully with relevant prescriber(s) if any changes are desired or seem indicated. Crisis service -- Aware of call list and work-in appts.  Call the clinic on-call service, 988/hotline, 911, or present to [name] or ER if any life-threatening psychiatric crisis. Followup -- Return for time as already scheduled.  Next scheduled  visit with me 05/29/2023.  Next scheduled in this office 05/29/2023.  Louis Kendall, PhD Louis Kendall, PhD LP Clinical Psychologist, [facility] [address] [place], [place] [address] [phone]

## 2023-05-29 ENCOUNTER — Ambulatory Visit (INDEPENDENT_AMBULATORY_CARE_PROVIDER_SITE_OTHER): Admitting: Psychiatry

## 2023-05-29 DIAGNOSIS — F411 Generalized anxiety disorder: Secondary | ICD-10-CM

## 2023-05-29 DIAGNOSIS — F900 Attention-deficit hyperactivity disorder, predominantly inattentive type: Secondary | ICD-10-CM | POA: Diagnosis not present

## 2023-05-29 DIAGNOSIS — G4721 Circadian rhythm sleep disorder, delayed sleep phase type: Secondary | ICD-10-CM | POA: Diagnosis not present

## 2023-05-29 DIAGNOSIS — F3341 Major depressive disorder, recurrent, in partial remission: Secondary | ICD-10-CM | POA: Diagnosis not present

## 2023-05-29 NOTE — Progress Notes (Signed)
 Psychotherapy Progress Note [facility] [name], PhD LP  Patient ID: [name])    MRN: [id number] Therapy format: Individual psychotherapy Date: 05/29/2023      Start: 3:08p     Stop: 3:50p     Time Spent: 42 min Location: In-person   Session narrative (presenting needs, interim history, self-report of stressors and symptoms, applications of prior therapy, status changes, and interventions made in session) Semester finished, relieved but uncomfortable still.  Off Concerta  since demands of school are off.  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  Sleep cycle has wobbled off lately, too, indulging impulses to stay up later.  One influence has been gaming with a friend Louis [name], who had turned reclusive.  Can attribute down mood to all of the above -- circadian disruption, lonely summertime status, lack of stimulus in activities, starting to overeat again.  Discussed options, possibilities for social or other stimulus, and encouraged in finding anger outlets and physical outlets if needed, not just screen time and food as sedative.  Encouraged to go ahead and break ice on his website project, too, and if needed put a creative moment into how he could do it badly, not just right.  Related complaint of M telling him his room is too messy to live on his own, an indication he's not ready to be out in the world, which is apparently unknowingly but fairly potently damning to him.  Support provided, framing how she apparently doesn't know how critiques like that echo in a mind like his, where he is already frequently self-critical, offer made to help him address it if he wants.  Otherwise, endorsed free choice about pursuing separate living quarters and supporting himself more independently to reduce both interpersonal friction and cognitive dissonance about whether he has what it takes to be an actual adult in the world.  Therapeutic modalities: Cognitive  Behavioral Therapy, Solution-Oriented/Positive Psychology, and Ego-Supportive  Mental Status/Observations:  Appearance:   Casual     Behavior:  Appropriate  Motor:  Normal  Speech/Language:   Clear and Coherent  Affect:  Appropriate  Mood:  anxious  Thought process:  normal  Thought content:    WNL  Sensory/Perceptual disturbances:    WNL  Orientation:  Fully oriented  Attention:  Good    Concentration:  Fair  Memory:  WNL  Insight:    Good  Judgment:   Good  Impulse Control:  Fair   Risk Assessment: Danger to Self: No Self-injurious Behavior: No Danger to Others: No Physical Aggression / Violence: No Duty to Warn: No Access to Firearms a concern: No  Assessment of progress:  progressing  Diagnosis:   ICD-10-CM   1. Major depressive disorder, recurrent, in partial remission (HCC)  F33.41     2. Generalized anxiety disorder  F41.1     3. Attention deficit hyperactivity disorder (ADHD), predominantly inattentive type  F90.0     4. 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 anxiety from piling up work Biomedical scientist self-motivation -- not trying harder but just do ___ then see how it goes.  Don't make it about having motivation but about applying the motivation he has for small, doable things to break inertia. 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  Notice moments of motivational paralysis and treat them same as music mistakes -- play through, a note at a time, with full freedom to acknowledge and emote (e.g., scream internally, permission to say I'm really uncomfortable ...) while doing it.  Notice and rethink perfectionistic self-expectations and urges to rehearse self-blame for delayed progress in school.  As for existential issues, acknowledge then see through to action toward something meaningful, and count it good. Sleep and circadian management -- Resist staying up too late, accept a reasonable endpoint to the day.  Keep phone out of reach at bed.  Recommend blue light curfew or use actual orange lenses 1-2 hrs before bed.  Keep CPAP in good order and regular use, or reassess need. Metabolic self-regulation and nutrition -- Support in weight loss and overall healthy orientation.  Seek more movement/exercise.  Look into exercise snacks, especially if they will elevate HR or get winded for a bit.  May do double duty as desensitization to panic.  Continue driving down night snacking.  Continue practice of resisting night eating and doing something else at least 10 minutes, accepting going to bed a little hungry.  Continue shifting to more whole and prepared foods over convenience items.  Option to use MCT oil as the only calories after a food curfew (e.g., 10pm) if munchies are too strong to resist, and keep real food for real daylight.   Given MTHFR, recommend add B complex supplement or go on and try methylated B12 and folic acid.  MVI probably also worthwhile, given issues and some degraded nutrition, and how supplemented micronutrients may help stem night hunger.  May need a general physical to rule out other issues. Attention and time awareness v. distraction -- Try 1-minute listenings as mindfulness practice.  Option practice estimating 1 minute elapsed without using a timepiece.  If time blindness in shower or elsewhere, use music or a clock to ground awareness of elapsed time. Communication with parents -- Continue agreement to let good night be good night, without late interruptions or veiled late attempts to motivate or teach.  Cont to assert as needed if feedback is unnecessarily guilting, catastrophizing, dogpiling, or looping.  For pestering, lecturing, or Scientific laboratory technician (Mom, mainly), options to forgive and take it with a grain of salt, or ask an agreement to be short and blunt -- e.g., I'd rather hear Will you PLEASE take better care of my son?! -- than go on about what I should be doing, or how it just takes will power, or I'm not trying hard enough, etc.  Option as needed to ask M to reconsider whether she knows how it feels dealing with an issue she  comments on, as F and B do, but willing to frame it as It is hard AND I am trying.  Option to notice and thank M for detectable efforts restraining tendencies, and option to ask her how she would tell if he is well enough aware and capable of managing risks or problems she sees on his behalf. Social/morale -- Continue exploring friend group, permission to realign and rightsize.  Ongoing option to challenge friends if they are harming themselves.  Option to re-explore dating.  Endorse interests not on screens and exploring communities of shared interest. Substances -- Continue smoking cessation, by way of nicotine gum or patch if necessary, but preferably weaning readily.   Recommend fully abstain from pot, but acceptable control with current use.  Keep alcohol moderate, bring up if need for further help is warranted. Other recommendations/advice -- As may be noted above.  Continue to utilize previously learned skills ad lib. Medication compliance -- Maintain medication as prescribed and work faithfully with relevant prescriber(s) if any changes are desired or seem indicated. Crisis service -- Aware of call list and work-in appts.  Call the clinic on-call service, 988/hotline, 911, or present to [facility] or ER if any life-threatening psychiatric crisis. Followup -- Return for time as already scheduled.  Next scheduled visit with me 06/12/2023.  Next scheduled in this office 06/12/2023.  Lamar Kendall, PhD [name], PhD LP Clinical Psychologist, [facility] [address], [address] [place], [place] [address] [phone]

## 2023-06-12 ENCOUNTER — Ambulatory Visit: Admitting: Psychiatry

## 2023-06-26 ENCOUNTER — Ambulatory Visit (INDEPENDENT_AMBULATORY_CARE_PROVIDER_SITE_OTHER): Admitting: Psychiatry

## 2023-06-26 DIAGNOSIS — F900 Attention-deficit hyperactivity disorder, predominantly inattentive type: Secondary | ICD-10-CM | POA: Diagnosis not present

## 2023-06-26 DIAGNOSIS — F3341 Major depressive disorder, recurrent, in partial remission: Secondary | ICD-10-CM | POA: Diagnosis not present

## 2023-06-26 DIAGNOSIS — G4721 Circadian rhythm sleep disorder, delayed sleep phase type: Secondary | ICD-10-CM | POA: Diagnosis not present

## 2023-06-26 DIAGNOSIS — F411 Generalized anxiety disorder: Secondary | ICD-10-CM

## 2023-06-26 NOTE — Progress Notes (Signed)
 Psychotherapy Progress Note [facility] [name], PhD LP  Patient ID: Louis [name] [facility])    MRN: [id number] Therapy format: Individual psychotherapy Date: 06/26/2023      Start: 1:09p     Stop: 1:56p     Time Spent: 47 min Location: In-person   Session narrative (presenting needs, interim history, self-report of stressors and symptoms, applications of prior therapy, status changes, and interventions made in session) Summer feeling more demotivated, nothing urgent, hard to gear up for things.  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  Off stimulant for the summer, may have something to do with it.  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    Discussed efforts in progress -- has a lunch plan with an Psychiatric nurse at [employer], his father's former company, which we can agree is a constructive, not-paralyzed step to take.  Obviously worth working on the website, even if the product is not highly motivating, just to get the requirement under his belt and break down all-or-none feelings of demotivation or morale.  Good life lesson getting his car towed and worked on -- the exhaust system he had worried for months would wind up being > $1000 was a fraction of that and fixed in short order.  Commended a s self-reminder not to assume the worse, just find out.    Therapeutic modalities: Cognitive Behavioral Therapy, Solution-Oriented/Positive Psychology, and Ego-Supportive  Mental Status/Observations:  Appearance:   Casual     Behavior:  Appropriate  Motor:  Normal  Speech/Language:   Clear and Coherent  Affect:  Appropriate  Mood:  dysthymic  Thought process:  normal  Thought content:    WNL  Sensory/Perceptual disturbances:    WNL  Orientation:  Fully oriented   Attention:  Good    Concentration:  Fair  Memory:  WNL  Insight:    Good  Judgment:   Good  Impulse Control:  Fair   Risk Assessment: Danger to Self: No Self-injurious Behavior: No Danger to Others: No Physical Aggression / Violence: No Duty to Warn: No Access to Firearms a concern: No  Assessment of progress:  stabilized  Diagnosis:   ICD-10-CM   1. Major depressive disorder, recurrent, in partial remission (HCC)  F33.41     2. Generalized anxiety disorder  F41.1     3. Attention deficit hyperactivity disorder (ADHD), predominantly inattentive type  F90.0     4. 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 anxiety from piling up work Biomedical scientist self-motivation -- not trying harder but just do ___ then see how it goes.  Don't make it about having motivation but about applying the motivation he has for small, doable things to break inertia. May use paradoxical motivation -- express aloud not wanting to, tell the cat about it -- or moral support of father or a friend of choice Take the initiative to invite and establish study buddies, for  activation, alertness, accountability, and enriched perspective Make use of TAs and tutoring PRN Where needed and available, supplement with [employer] or other available self-instruction Get expert advice on internship timing and process from academic advisor Make use of university career center to help organize how he looks to the working world with internship search and prepares to market himself as a new worker Anxiety/shame management -- As  needed, use paced breathing, walking, 2/day mini-meditations or slowdowns to reduce ANS arousal contributing to anxiety and ADHD.  Notice moments of motivational paralysis and treat them same as music mistakes -- play through, a note at a time, with full freedom to acknowledge and emote (e.g., scream internally, permission to say I'm really uncomfortable ...) while doing it.  Notice and rethink perfectionistic self-expectations and urges to rehearse self-blame for delayed progress in school.  As for existential issues, acknowledge then see through to action toward something meaningful, and count it good. Sleep and circadian management -- Resist staying up too late, accept a reasonable endpoint to the day.  Keep phone out of reach at bed.  Recommend blue light curfew or use actual orange lenses 1-2 hrs before bed.  Keep CPAP in good order and regular use, or reassess need. Metabolic self-regulation and nutrition -- Support in weight loss and overall healthy orientation.  Seek more movement/exercise.  Look into exercise snacks, especially if they will elevate HR or get winded for a bit.  May do double duty as desensitization to panic.  Continue driving down night snacking.  Continue practice of resisting night eating and doing something else at least 10 minutes, accepting going to bed a little hungry.  Continue shifting to more whole and prepared foods over convenience items.  Option to use MCT oil as the only calories after a food curfew (e.g., 10pm) if munchies are too strong to resist, and keep real food for real daylight.  Given MTHFR, recommend add B complex supplement or go on and try methylated B12 and folic acid.  MVI probably also worthwhile, given issues and some degraded nutrition, and how supplemented micronutrients may help stem night hunger.  May need a general physical to rule out other issues. Attention and time awareness v. distraction -- Try 1-minute listenings as mindfulness practice.   Option practice estimating 1 minute elapsed without using a timepiece.  If time blindness in shower or elsewhere, use music or a clock to ground awareness of elapsed time. Communication with parents -- Continue agreement to let good night be good night, without late interruptions or veiled late attempts to motivate or teach.  Cont to assert as needed if feedback is unnecessarily guilting, catastrophizing, dogpiling, or looping.  For pestering, lecturing, or Scientific laboratory technician (Mom, mainly), options to forgive and take it with a grain of salt, or ask an agreement to be short and blunt -- e.g., I'd rather hear Will you PLEASE take better care of my son?! -- than go on about what I should be doing, or how it just takes will power, or I'm not trying hard enough, etc.  Option as needed to ask M to reconsider whether she knows how it feels dealing with an issue she comments on, as F and B do, but willing to frame it as It is hard AND I am trying.  Option to notice and thank M for detectable efforts restraining tendencies, and option to ask her how she would tell if he is well enough aware and capable of managing risks or problems she sees  on his behalf. Social/morale -- Continue exploring friend group, permission to realign and rightsize.  Ongoing option to challenge friends if they are harming themselves.  Option to re-explore dating.  Endorse interests not on screens and exploring communities of shared interest. Substances -- Continue smoking cessation, by way of nicotine gum or patch if necessary, but preferably weaning readily.  Recommend fully abstain from pot, but acceptable control with current use.  Keep alcohol moderate, bring up if need for further help is warranted. Other recommendations/advice -- As may be noted above.  Continue to utilize previously learned skills ad lib. Medication compliance -- Maintain medication as prescribed and work faithfully with relevant prescriber(s) if any changes are desired  or seem indicated. Crisis service -- Aware of call list and work-in appts.  Call the clinic on-call service, 988/hotline, 911, or present to [facility] or ER if any life-threatening psychiatric crisis. Followup -- Return for time as already scheduled.  Next scheduled visit with me 07/14/2023.  Next scheduled in this office 07/14/2023.  Lamar Kendall, PhD [name], PhD LP Clinical Psychologist, [facility] [address] [place], [place] [address] [phone]

## 2023-07-14 ENCOUNTER — Ambulatory Visit (INDEPENDENT_AMBULATORY_CARE_PROVIDER_SITE_OTHER): Admitting: Psychiatry

## 2023-07-14 DIAGNOSIS — Z1589 Genetic susceptibility to other disease: Secondary | ICD-10-CM

## 2023-07-14 DIAGNOSIS — F411 Generalized anxiety disorder: Secondary | ICD-10-CM

## 2023-07-14 DIAGNOSIS — F331 Major depressive disorder, recurrent, moderate: Secondary | ICD-10-CM

## 2023-07-14 DIAGNOSIS — F900 Attention-deficit hyperactivity disorder, predominantly inattentive type: Secondary | ICD-10-CM | POA: Diagnosis not present

## 2023-07-14 DIAGNOSIS — G4721 Circadian rhythm sleep disorder, delayed sleep phase type: Secondary | ICD-10-CM

## 2023-07-14 DIAGNOSIS — F4010 Social phobia, unspecified: Secondary | ICD-10-CM

## 2023-07-14 NOTE — Progress Notes (Signed)
 Psychotherapy Progress Note [facility] 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  Career counselor at [employer] seems near clueless to him, maybe 2 years post bachelor's, never had a job in her trained field, only suggested go to grad school to create more time to decide his direction.  Did have lunch with the Scientist, research (physical sciences) and saw the workplace, which hopefully helps establish some vision of working.  Acknowledges intermittently missing antidepressant for a day or two, then leaving off as much as a week at a time.  Today is first dose in a week.  Interpreted putting himself into withdrawal, which makes everything look and feel worse.  Also been in a run of staying up to 2:30am, getting up about 11am, supportively confronted as effectively taking a depressant.  Admittedly, it would also quiet conflict with parents (M, primarily) if he would restore sleep schedule.  Thinks he may be subconsciously imitating internet streamers he watches (watches to feel better than he does) who also live altered circadian schedules.  For her part, M has been more critical lately, says she can't tell the difference between computer work and computer play, and he's not particularly trying to ask her to notice (or trust).  Says she monologues to the point where he's just not listening or responding.  Encouraged be willing to give her some kind of indication that it's an issue, and probed whether he's having the same kind of learned helplessness experience now that he had at [facility], still upholding the value of  breaking down whatever intimidates him and doing a small enough piece not to fight it.  Offer renewed to include M and/or F in therapy to broker healthier dynamic.  Confirms he would like to live independently but not if it means depending on parents' underwriting it, i.e., not truly independent.  Traveling to [place] with family 7/19.  No indication it will feel unwanted.  Encouraged to regulate medication and knock off a few school/career tasks by then.  Therapeutic modalities: Cognitive Behavioral Therapy, Solution-Oriented/Positive Psychology, and Ego-Supportive  Mental Status/Observations:  Appearance:   Casual     Behavior:  Appropriate  Motor:  Normal  Speech/Language:   Less energy  Affect:  Depressed  Mood:  depressed  Thought process:  normal  Thought content:    WNL  Sensory/Perceptual disturbances:    WNL  Orientation:  Fully oriented  Attention:  Good    Concentration:  Good  Memory:  WNL  Insight:    Variable  Judgment:   Good  Impulse Control:  Fair   Risk Assessment: Danger to Self: No Self-injurious Behavior: No Danger to Others: No Physical Aggression / Violence: No Duty to Warn: No Access to Firearms a concern: No  Assessment of progress:  situational setback(s)  Diagnosis:   ICD-10-CM   1. Major depressive disorder, recurrent episode, moderate (HCC)  F33.1     2. Generalized anxiety disorder  F41.1     3. Attention deficit hyperactivity disorder (ADHD), predominantly inattentive type  F90.0     4. 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 MTHFR gene mutation  Z15.89     6. Social anxiety disorder  F40.10      Plan:  Priorities today to regulate medication, try to regulate sleep timing, attack small enough tasks for school and/or career search, and strongly consider renegotiating responsibility and perceptions with mother in particular Academic stress management -- as needed: Anti-procrastination -- self-remind of  his purposes/goals, break down task requirements until he can willing take first step, and if unwilling to put in the work right now, be explicit with self about it Practice identifying tasks to go ahead with and work ahead, self-affirm he'll appreciate it soon enough vs. anxiety from piling up work Biomedical scientist self-motivation -- not trying harder but just do ___ then see how it goes.  Don't make it about having motivation but about applying the motivation he has for small, doable things to break inertia. 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  Notice moments of motivational paralysis and treat them same as music mistakes -- play through, a note at a time, with full freedom to acknowledge and emote (e.g., scream internally, permission to say I'm really uncomfortable ...) while doing it.  Notice and rethink perfectionistic self-expectations and urges to rehearse self-blame for delayed progress in school.  As for existential issues, acknowledge then see through to action toward something meaningful, and count it good. Sleep and circadian management -- Resist staying up too late, accept a reasonable endpoint to the day.  Keep phone out of reach at bed.   Recommend blue light curfew or use actual orange lenses 1-2 hrs before bed.  Keep CPAP in good order and regular use, or reassess need. Metabolic self-regulation and nutrition -- Support in weight loss and overall healthy orientation.  Seek more movement/exercise.  Look into exercise snacks, especially if they will elevate HR or get winded for a bit.  May do double duty as desensitization to panic.  Continue driving down night snacking.  Continue practice of resisting night eating and doing something else at least 10 minutes, accepting going to bed a little hungry.  Continue shifting to more whole and prepared foods over convenience items.  Option to use MCT oil as the only calories after a food curfew (e.g., 10pm) if munchies are too strong to resist, and keep real food for real daylight.  Given MTHFR, recommend add B complex supplement or go on and try methylated B12 and folic acid.  MVI probably also worthwhile, given issues and some degraded nutrition, and how supplemented micronutrients may help stem night hunger.  May need a general physical to rule out other issues. Attention and time awareness v. distraction -- Try 1-minute listenings as mindfulness practice.  Option practice estimating 1 minute elapsed without using a timepiece.  If time blindness in shower or elsewhere,  use music or a clock to ground awareness of elapsed time. Communication with parents -- Continue agreement to let good night be good night, without late interruptions or veiled late attempts to motivate or teach.  Cont to assert as needed if feedback is unnecessarily guilting, catastrophizing, dogpiling, or looping.  For pestering, lecturing, or Scientific laboratory technician (Mom, mainly), options to forgive and take it with a grain of salt, or ask an agreement to be short and blunt -- e.g., I'd rather hear Will you PLEASE take better care of my son?! -- than go on about what I should be doing, or how it just takes will power, or I'm not trying  hard enough, etc.  Option as needed to ask M to reconsider whether she knows how it feels dealing with an issue she comments on, as F and B do, but willing to frame it as It is hard AND I am trying.  Option to notice and thank M for detectable efforts restraining tendencies, and option to ask her how she would tell if he is well enough aware and capable of managing risks or problems she sees on his behalf. Social/morale -- Continue exploring friend group, permission to realign and rightsize.  Ongoing option to challenge friends if they are harming themselves.  Option to re-explore dating.  Endorse interests not on screens and exploring communities of shared interest. Substances -- Continue smoking cessation, by way of nicotine gum or patch if necessary, but preferably weaning readily.  Recommend fully abstain from pot, but acceptable control with current use.  Keep alcohol moderate, bring up if need for further help is warranted. Other recommendations/advice -- As may be noted above.  Continue to utilize previously learned skills ad lib. Medication compliance -- Maintain medication as prescribed and work faithfully with relevant prescriber(s) if any changes are desired or seem indicated. Crisis service -- Aware of call list and work-in appts.  Call the clinic on-call service, 988/hotline, 911, or present to [facility] or ER if any life-threatening psychiatric crisis. Followup -- Return for time as already scheduled.  Next scheduled visit with me 07/24/2023.  Next scheduled in this office 07/24/2023.  Louis Kendall, PhD [name], PhD LP Clinical Psychologist, [facility] [address] [place], [place] [address] [phone]

## 2023-07-24 ENCOUNTER — Ambulatory Visit (INDEPENDENT_AMBULATORY_CARE_PROVIDER_SITE_OTHER): Admitting: Psychiatry

## 2023-07-24 DIAGNOSIS — G4721 Circadian rhythm sleep disorder, delayed sleep phase type: Secondary | ICD-10-CM | POA: Diagnosis not present

## 2023-07-24 DIAGNOSIS — F4010 Social phobia, unspecified: Secondary | ICD-10-CM | POA: Diagnosis not present

## 2023-07-24 DIAGNOSIS — F900 Attention-deficit hyperactivity disorder, predominantly inattentive type: Secondary | ICD-10-CM | POA: Diagnosis not present

## 2023-07-24 DIAGNOSIS — F331 Major depressive disorder, recurrent, moderate: Secondary | ICD-10-CM | POA: Diagnosis not present

## 2023-07-24 NOTE — Progress Notes (Signed)
 Psychotherapy Progress Note [facility] [name], PhD LP  Patient ID: Louis [name] [facility])    MRN: [id number] Therapy format: Individual psychotherapy Date: 07/24/2023      Start: 2:21p     Stop: 3:109p     Time Spent: 48 min Location: In-person   Session narrative (presenting needs, interim history, self-report of stressors and symptoms, applications of prior therapy, status changes, and interventions made in session) Back on antidepressant for a week, and yes, it does feel noticeably better.  [place] trip in 2 days with family. 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  Annoying incident recently with parents getting on him about moving along on work they didn't understand.  Sleep schedule running more like 2:30a-8a now, which is short but up.  Encouraged to nail down a more reasonable sleep time and keep committing to reasonably short times of pressing into tasks that further his degree completion, even if he is less sure of using it.  Meanwhile, encouraged to try petitioning parents for some bit of discretion to deviate from the highly planned itinerary on the trip, to take some modest amount of me time, precisely so that he takes care of anxiety enough not to run down his resistance and catch something, or become fatigued beyond jet lag by carrying tension too long.  Briefed further on possible assertive responses to overparenting.  Therapeutic modalities: Cognitive Behavioral Therapy, Solution-Oriented/Positive Psychology, Ego-Supportive, and Assertiveness/Communication  Mental Status/Observations:  Appearance:   Casual     Behavior:  Appropriate  Motor:  Normal  Speech/Language:   Clear and Coherent  Affect:  Appropriate and Constricted  Mood:  anxious  Thought process:  normal  Thought content:    WNL   Sensory/Perceptual disturbances:    WNL  Orientation:  Fully oriented  Attention:  Good    Concentration:  Good  Memory:  WNL  Insight:    Good  Judgment:   Good  Impulse Control:  Variable   Risk Assessment: Danger to Self: No Self-injurious Behavior: No Danger to Others: No Physical Aggression / Violence: No Duty to Warn: No Access to Firearms a concern: No  Assessment of progress:  progressing  Diagnosis:   ICD-10-CM   1. Major depressive disorder, recurrent episode, moderate (HCC)  F33.1     2. Attention deficit hyperactivity disorder (ADHD), predominantly inattentive type  F90.0     3. 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 Social anxiety disorder  F40.10      Plan:  Priorities today to maintain medication, try to regulate sleep timing, attack small enough tasks for school and/or career search, and strongly consider renegotiating responsibility and perceptions with mother in particular Academic stress management -- as needed: Anti-procrastination -- self-remind of his purposes/goals, break down task requirements until he can willing take first step, and if unwilling to put in the work right now, be explicit with self about it Practice identifying tasks to go ahead with and work ahead, self-affirm he'll appreciate it soon enough vs. anxiety from piling up work Biomedical scientist self-motivation -- not trying harder but just do ___ then see how it goes.  Don't make it about having motivation but about applying the motivation he has for small, doable things to break inertia. 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  Notice moments of motivational paralysis and treat them same as music mistakes -- play through, a note at a time, with full freedom to acknowledge and emote (e.g., scream internally, permission to say I'm really uncomfortable ...) while doing it.  Notice and rethink perfectionistic self-expectations and urges to rehearse self-blame for delayed progress in school.  As for existential issues, acknowledge then see through to action toward something meaningful, and count it good. Sleep and circadian management -- Resist staying up too late, accept a reasonable endpoint to the day.  Keep phone out of reach at bed.  Recommend blue light curfew or use actual orange lenses 1-2 hrs before bed.  Keep CPAP in good order and regular use, or reassess need. Metabolic self-regulation and nutrition -- Support in weight loss and overall healthy orientation.  Seek more movement/exercise.  Look into exercise snacks, especially if they will elevate HR or get winded for a bit.  May do double duty as desensitization to panic.  Continue driving down night snacking.  Continue practice of resisting night eating and doing something else at least 10 minutes, accepting going to bed a little hungry.  Continue shifting to more whole and prepared foods over convenience items.  Option to use MCT oil as the only calories after a food curfew (e.g., 10pm) if munchies are too strong to resist, and keep real food for real daylight.  Given MTHFR, recommend add B complex supplement or go on and try methylated B12 and folic  acid.  MVI probably also worthwhile, given issues and some degraded nutrition, and how supplemented micronutrients may help stem night hunger.  May need a general physical to rule out other issues. Attention and time awareness v. distraction -- Try 1-minute listenings as mindfulness practice.  Option practice estimating 1 minute elapsed without using a timepiece.  If time blindness in shower or elsewhere, use music or a clock to ground awareness of elapsed time. Communication with parents -- Continue agreement to let good night be good night, without late interruptions or veiled late attempts to motivate or teach.  Cont to assert as needed if feedback is unnecessarily guilting, catastrophizing, dogpiling, or looping.  For pestering, lecturing, or Scientific laboratory technician (Mom, mainly), options to forgive and take it with a grain of salt, or ask an agreement to be short and blunt -- e.g., I'd rather hear Will you PLEASE take better care of my son?! -- than go on about what I should be doing, or how it just takes will power, or I'm not trying hard enough, etc.  Option as needed to ask M to reconsider whether she knows how it feels dealing with an issue she comments on, as F and B do, but willing to frame it as It is hard AND I am trying.  Option to notice and  thank M for detectable efforts restraining tendencies, and option to ask her how she would tell if he is well enough aware and capable of managing risks or problems she sees on his behalf. Social/morale -- Continue exploring friend group, permission to realign and rightsize.  Ongoing option to challenge friends if they are harming themselves.  Option to re-explore dating.  Endorse interests not on screens and exploring communities of shared interest. Substances -- Continue smoking cessation, by way of nicotine gum or patch if necessary, but preferably weaning readily.  Recommend fully abstain from pot, but acceptable control with current use.  Keep alcohol  moderate, bring up if need for further help is warranted. Other recommendations/advice -- As may be noted above.  Continue to utilize previously learned skills ad lib. Medication compliance -- Maintain medication as prescribed and work faithfully with relevant prescriber(s) if any changes are desired or seem indicated. Crisis service -- Aware of call list and work-in appts.  Call the clinic on-call service, 988/hotline, 911, or present to [facility] or ER if any life-threatening psychiatric crisis. Followup -- No follow-ups on file.  Next scheduled visit with me 08/08/2023.  Next scheduled in this office 08/08/2023.  Lamar Kendall, PhD [name], PhD LP Clinical Psychologist, [facility] [address], [address] [place], [place] [address] [phone]

## 2023-08-08 ENCOUNTER — Ambulatory Visit (INDEPENDENT_AMBULATORY_CARE_PROVIDER_SITE_OTHER): Admitting: Psychiatry

## 2023-08-08 DIAGNOSIS — F331 Major depressive disorder, recurrent, moderate: Secondary | ICD-10-CM | POA: Diagnosis not present

## 2023-08-08 DIAGNOSIS — F900 Attention-deficit hyperactivity disorder, predominantly inattentive type: Secondary | ICD-10-CM | POA: Diagnosis not present

## 2023-08-08 DIAGNOSIS — G4721 Circadian rhythm sleep disorder, delayed sleep phase type: Secondary | ICD-10-CM | POA: Diagnosis not present

## 2023-08-08 DIAGNOSIS — F411 Generalized anxiety disorder: Secondary | ICD-10-CM | POA: Diagnosis not present

## 2023-08-08 NOTE — Progress Notes (Signed)
 Psychotherapy Progress Note [facility] [name], PhD LP  Patient ID: Louis [name] [facility])    MRN: [id number] Therapy format: Individual psychotherapy Date: 08/08/2023      Start: 2:18p     Stop: 3:05p     Time Spent: 47 min Location: In-person   Session narrative (presenting needs, interim history, self-report of stressors and symptoms, applications of prior therapy, status changes, and interventions made in session) Good time in [place] with family.  Back 2 days, working through jet lag.  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  Less antsy traveling than he had expected.  Kind of a bummer getting back to work at [employer], and facing his complacency again (apprehension, future shock).  Admits in a loop of putting it off looking ahead to the needs of his final semester and transition to post-degree working life.  Discussed outlook, ways to motivate, and identified steps to break his own ice.  Confirms he is back on regular medication and does feel substantially better able to overcome anxiety and engage tasks.  Assured with further recovery from jet lag, he will also feel better able to meet demands.  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  Therapeutic modalities: Cognitive Behavioral Therapy, Solution-Oriented/Positive Psychology, and Ego-Supportive  Mental Status/Observations:  Appearance:   Casual     Behavior:  Appropriate  Motor:  Normal  Speech/Language:   Clear and Coherent  Affect:  Appropriate  Mood:  better  Thought process:  normal  Thought content:    WNL  Sensory/Perceptual disturbances:    WNL  Orientation:  Fully oriented  Attention:  Good    Concentration:  Good  Memory:  WNL  Insight:    Good  Judgment:   Good  Impulse Control:  Fair   Risk Assessment: Danger to Self: No Self-injurious Behavior: No Danger to Others: No Physical Aggression / Violence:  No Duty to Warn: No Access to Firearms a concern: No  Assessment of progress:  progressing  Diagnosis:   ICD-10-CM   1. Major depressive disorder, recurrent episode, moderate (HCC)  F33.1     2. Generalized anxiety disorder  F41.1     3. Attention deficit hyperactivity disorder (ADHD), predominantly inattentive type  F90.0     4. 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 anxiety from piling up work Biomedical scientist self-motivation -- not trying harder but just do ___ then see how it goes.  Don't make it about having motivation but about applying the motivation he has for small, doable things to break inertia. 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  Notice moments of motivational paralysis and treat them same as music mistakes -- play through, a note at a time, with  full freedom to acknowledge and emote (e.g., scream internally, permission to say I'm really uncomfortable ...) while doing it.  Notice and rethink perfectionistic self-expectations and urges to rehearse self-blame for delayed progress in school.  As for existential issues, acknowledge then see through to action toward something meaningful, and count it good. Sleep and circadian management -- Resist staying up too late, accept a reasonable endpoint to the day.  Keep phone out of reach at bed.  Recommend blue light curfew or use actual orange lenses 1-2 hrs before bed.  Keep CPAP in good order and regular use, or reassess need. Metabolic self-regulation and nutrition -- Support in weight loss and overall healthy orientation.  Seek more movement/exercise.  Look into exercise snacks, especially if they will elevate HR or get winded for a bit.  May do double duty as desensitization to panic.  Continue driving down night snacking.  Continue practice of resisting night eating and doing something else at least 10 minutes, accepting going to bed a little hungry.  Continue shifting to more whole and prepared foods over convenience items.  Option to use MCT oil as the only calories after a food curfew (e.g., 10pm) if munchies are too strong to resist, and keep real food for real daylight.  Given MTHFR, recommend add B complex supplement or go on and try methylated B12 and folic acid.  MVI probably also worthwhile, given issues and some degraded nutrition, and how supplemented micronutrients may help stem night hunger.  May need a general physical to rule out other issues. Attention and time awareness v. distraction -- Try 1-minute listenings as mindfulness practice.  Option practice estimating 1 minute elapsed without using a timepiece.  If time blindness in shower or elsewhere, use music or a clock to ground awareness of elapsed time. Communication with parents -- Continue agreement to let good night be good  night, without late interruptions or veiled late attempts to motivate or teach.  Cont to assert as needed if feedback is unnecessarily guilting, catastrophizing, dogpiling, or looping.  For pestering, lecturing, or Scientific laboratory technician (Mom, mainly), options to forgive and take it with a grain of salt, or ask an agreement to be short and blunt -- e.g., I'd rather hear Will you PLEASE take better care of my son?! -- than go on about what I should be doing, or how it just takes will power, or I'm not trying hard enough, etc.  Option as needed to ask M to reconsider whether she knows how it feels dealing with an issue she comments on, as F and B do, but willing to frame it as It is hard AND I am trying.  Option to notice and thank M for detectable efforts restraining tendencies, and option to ask her how she would tell if he is well enough aware and capable of managing risks or problems she sees on his behalf. Social/morale -- Continue exploring friend group, permission to realign and rightsize.  Ongoing option to challenge friends if they are harming themselves.  Option to re-explore dating.  Endorse interests not on screens and exploring communities of shared interest. Substances -- Continue smoking cessation, by way of  nicotine gum or patch if necessary, but preferably weaning readily.  Recommend fully abstain from pot, but acceptable control with current use.  Keep alcohol moderate, bring up if need for further help is warranted. Other recommendations/advice -- As may be noted above.  Continue to utilize previously learned skills ad lib. Medication compliance -- Maintain medication as prescribed and work faithfully with relevant prescriber(s) if any changes are desired or seem indicated. Crisis service -- Aware of call list and work-in appts.  Call the clinic on-call service, 988/hotline, 911, or present to [facility] or ER if any life-threatening psychiatric crisis. Followup -- No follow-ups on file.  Next scheduled  visit with me 08/29/2023.  Next scheduled in this office 08/29/2023.  Lamar Kendall, PhD [name], PhD LP Clinical Psychologist, [facility] [address], [address] [place], [place] [address] [phone]

## 2023-08-29 ENCOUNTER — Ambulatory Visit (INDEPENDENT_AMBULATORY_CARE_PROVIDER_SITE_OTHER): Admitting: Psychiatry

## 2023-08-29 DIAGNOSIS — F3341 Major depressive disorder, recurrent, in partial remission: Secondary | ICD-10-CM | POA: Diagnosis not present

## 2023-08-29 DIAGNOSIS — F4010 Social phobia, unspecified: Secondary | ICD-10-CM

## 2023-08-29 DIAGNOSIS — F900 Attention-deficit hyperactivity disorder, predominantly inattentive type: Secondary | ICD-10-CM | POA: Diagnosis not present

## 2023-08-29 DIAGNOSIS — F411 Generalized anxiety disorder: Secondary | ICD-10-CM | POA: Diagnosis not present

## 2023-08-29 NOTE — Progress Notes (Signed)
 Psychotherapy Progress Note [facility] Louis Kendall, PhD LP  Patient ID: Louis [name] [facility])    MRN: [id number] Therapy format: Individual psychotherapy Date: 08/29/2023      Start: 11:16a     Stop: 12:01p     Time Spent: 45 min Location: In-person   Session narrative (presenting needs, interim history, self-report of stressors and symptoms, applications of prior therapy, status changes, and interventions made in session) Last undergrad semester begun now.  Classes begun, senior capstone group project gearing up.  Apprehensive but favorable about his cohorts so far.  Knows he can face avoidance/procrastination again after his typically dedicated/motivated start to the semester.  Reviewed ideas for stress management -- quiet breaks, walking breaks, exercise snacks, breathing break.  For writer's block, recommend apply the freedom to write badly for a bit.  Is able to talk aloud to himself when alone, endorsed here as healthy and normal.  Does find he really wants to make it through school to the working world.  Been sinking in that he doesn't have to be expert in everything when he gets there.  Affirmed and encouraged in humane self-expectations, in the service of initiating effort.  Less stress with mom.  Still harps about housekeeping in his room, thinks mom is overdoing her standards.  Certainly not like his friend Sidra, who created a tangibly condemnable room.    Dealing with de Quervain's again -- hx of left wrist brace 6 wks, now right hand starting up.  Wants to be sure he won't be hobbled typing and in doing other hand-necessary work for a high needs semester like this.  Advised on antiinflammatory strategy, as he is already using Advil 1-2 per day, and encouraged to PCP if further treatment or advice is needed.  Set goals for next 2 wks -- would like to try (1) using breathing/walking breaks for energy renewal, and (2) further practicing grace with self to take on  parts, not have to think about all of a task, refocus as able on the thing I can do right now to engage it.  Therapeutic modalities: Cognitive Behavioral Therapy, Solution-Oriented/Positive Psychology, and Ego-Supportive  Mental Status/Observations:  Appearance:   Casual     Behavior:  Appropriate  Motor:  Normal  Speech/Language:   Clear and Coherent  Affect:  Appropriate  Mood:  anxious and less  Thought process:  normal  Thought content:    WNL  Sensory/Perceptual disturbances:    WNL  Orientation:  Fully oriented  Attention:  Good    Concentration:  Good  Memory:  WNL  Insight:    Good  Judgment:   Good  Impulse Control:  Variable   Risk Assessment: Danger to Self: No Self-injurious Behavior: No Danger to Others: No Physical Aggression / Violence: No Duty to Warn: No Access to Firearms a concern: No  Assessment of progress:  progressing  Diagnosis:   ICD-10-CM   1. Major depressive disorder, recurrent, in partial remission (HCC)  F33.41     2. Generalized anxiety disorder  F41.1     3. Social anxiety disorder  F40.10     4. Attention deficit hyperactivity disorder (ADHD), predominantly inattentive type  F90.0      Plan:  Academic stress management -- as needed: Anti-procrastination -- self-remind of his purposes/goals, break down task requirements until he can willing take first step, and if unwilling to put in the work right now, be explicit with self about it Practice identifying tasks to go ahead with  and work ahead, self-affirm he'll appreciate it soon enough vs. anxiety from piling up work Biomedical scientist self-motivation -- not trying harder but just do ___ then see how it goes.  Don't make it about having motivation but about applying the motivation he has for small, doable things to break inertia. 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  Notice moments of motivational paralysis and treat them same as music mistakes -- play through, a note at a time, with full freedom to acknowledge and emote (e.g., scream internally, permission to say I'm really uncomfortable ...) while doing it.  Notice and rethink perfectionistic self-expectations and urges to rehearse self-blame for delayed progress in school.  As for existential issues, acknowledge then see through to action toward something meaningful, and count it good. Sleep and circadian management -- Resist staying up too late, accept a reasonable endpoint to the day.  Keep phone out of reach at bed.  Recommend blue light curfew or use actual orange lenses 1-2 hrs before bed.  Keep CPAP in good order and regular use, or reassess need. Metabolic self-regulation and nutrition -- Support in weight loss and overall healthy orientation.  Seek more movement/exercise.  Look into exercise snacks, especially if they will elevate HR or get winded for a bit.  May do double duty as desensitization to panic.  Continue driving down night snacking.  Continue practice of resisting night eating and doing something else at least 10 minutes, accepting going to bed a little hungry.  Continue shifting to more whole and prepared foods over convenience items.  Option to use MCT oil as the  only calories after a food curfew (e.g., 10pm) if munchies are too strong to resist, and keep real food for real daylight.  Given MTHFR, recommend add B complex supplement or go on and try methylated B12 and folic acid.  MVI probably also worthwhile, given issues and some degraded nutrition, and how supplemented micronutrients may help stem night hunger.  May need a general physical to rule out other issues. Attention and time awareness v. distraction -- Try 1-minute listenings as mindfulness practice.  Option practice estimating 1 minute elapsed without using a timepiece.  If time blindness in shower or elsewhere, use music or a clock to ground awareness of elapsed time. Communication with parents -- Continue agreement to let good night be good night, without late interruptions or veiled late attempts to motivate or teach.  Cont to assert as needed if feedback is unnecessarily guilting, catastrophizing, dogpiling, or looping.  For pestering, lecturing, or overworry (Mom, mainly), options to forgive and take it with a grain of salt, or ask an agreement to be short and blunt -- e.g., I'd rather hear Will you PLEASE take better care of my son?! -- than go on about what I should be doing, or how  it just takes will power, or I'm not trying hard enough, etc.  Option as needed to ask M to reconsider whether she knows how it feels dealing with an issue she comments on, as F and B do, but willing to frame it as It is hard AND I am trying.  Option to notice and thank M for detectable efforts restraining tendencies, and option to ask her how she would tell if he is well enough aware and capable of managing risks or problems she sees on his behalf. Social/morale -- Continue exploring friend group, permission to realign and rightsize.  Ongoing option to challenge friends if they are harming themselves.  Option to re-explore dating.  Endorse interests not on screens and exploring communities of shared  interest. Substances -- Continue smoking cessation, by way of nicotine gum or patch if necessary, but preferably weaning readily.  Recommend fully abstain from pot, but acceptable control with current use.  Keep alcohol moderate, bring up if need for further help is warranted. Other recommendations/advice -- As may be noted above.  Continue to utilize previously learned skills ad lib. Medication compliance -- Maintain medication as prescribed and work faithfully with relevant prescriber(s) if any changes are desired or seem indicated. Crisis service -- Aware of call list and work-in appts.  Call the clinic on-call service, 988/hotline, 911, or present to [facility] or ER if any life-threatening psychiatric crisis. Followup -- Return for time as already scheduled.  Next scheduled visit with me 09/12/2023.  Next scheduled in this office 09/12/2023.  Louis Kendall, PhD Louis Kendall, PhD LP Clinical Psychologist, [facility] [address], [address] [place], [place] [address] [phone]

## 2023-09-03 ENCOUNTER — Encounter (INDEPENDENT_AMBULATORY_CARE_PROVIDER_SITE_OTHER): Payer: Self-pay | Admitting: Physician Assistant

## 2023-09-03 ENCOUNTER — Ambulatory Visit (INDEPENDENT_AMBULATORY_CARE_PROVIDER_SITE_OTHER): Admitting: Physician Assistant

## 2023-09-03 ENCOUNTER — Encounter (INDEPENDENT_AMBULATORY_CARE_PROVIDER_SITE_OTHER): Payer: Self-pay

## 2023-09-03 VITALS — BP 114/83 | HR 74 | Ht 70.0 in | Wt 232.0 lb

## 2023-09-03 DIAGNOSIS — G4721 Circadian rhythm sleep disorder, delayed sleep phase type: Secondary | ICD-10-CM

## 2023-09-03 DIAGNOSIS — E6609 Other obesity due to excess calories: Secondary | ICD-10-CM

## 2023-09-03 DIAGNOSIS — E66811 Obesity, class 1: Secondary | ICD-10-CM

## 2023-09-03 DIAGNOSIS — G4711 Idiopathic hypersomnia with long sleep time: Secondary | ICD-10-CM

## 2023-09-03 DIAGNOSIS — G4733 Obstructive sleep apnea (adult) (pediatric): Secondary | ICD-10-CM

## 2023-09-03 DIAGNOSIS — S93401A Sprain of unspecified ligament of right ankle, initial encounter: Secondary | ICD-10-CM

## 2023-09-03 DIAGNOSIS — F331 Major depressive disorder, recurrent, moderate: Secondary | ICD-10-CM

## 2023-09-03 DIAGNOSIS — Z6833 Body mass index (BMI) 33.0-33.9, adult: Secondary | ICD-10-CM

## 2023-09-03 DIAGNOSIS — F909 Attention-deficit hyperactivity disorder, unspecified type: Secondary | ICD-10-CM

## 2023-09-03 DIAGNOSIS — R0683 Snoring: Secondary | ICD-10-CM

## 2023-09-03 DIAGNOSIS — G4719 Other hypersomnia: Secondary | ICD-10-CM

## 2023-09-03 DIAGNOSIS — M25571 Pain in right ankle and joints of right foot: Secondary | ICD-10-CM

## 2023-09-03 DIAGNOSIS — Z0289 Encounter for other administrative examinations: Secondary | ICD-10-CM

## 2023-09-03 DIAGNOSIS — F411 Generalized anxiety disorder: Secondary | ICD-10-CM | POA: Diagnosis not present

## 2023-09-03 NOTE — Progress Notes (Unsigned)
 Office: [phone]  /  Fax: [phone]   Initial Visit    Amed Datta was seen in clinic today to evaluate for obesity. He is interested in losing weight to improve overall health and reduce the risk of weight related complications. He presents today to review program treatment options, initial physical assessment, and evaluation.     He was referred by: Friend or Family- Father is patient of [facility]- Avis Mcmahill  When asked what else they would like to accomplish? He states: Adopt a healthier eating pattern and lifestyle, Improve energy levels and physical activity, Improve existing medical conditions, Improve quality of life, Improve appearance, Improve self-confidence, and Lose 30-35 lbs  When asked how has your weight affected you? He states: Has affected self-esteem, Contributed to medical problems, Contributed to orthopedic problems or mobility issues, Having fatigue, Having poor endurance, Problems with eating patterns, and Has affected mood   Weight history: [place] Nordquist is a 28 year old male who presents for initial obesity treatment evaluation.  He has experienced progressive weight gain since high school, starting at approximately 190 pounds and increasing to 230-235 pounds over the past year or two. He attributes this to poor dietary habits, including cravings and nighttime eating, and a hectic lifestyle that leads to irregular eating patterns, such as skipping meals and overeating later.  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 He has attempted weight loss through tracking, journaling, and intermittent fasting but is not currently following any specific plan or exercising regularly.  He has a history of sleep apnea. He experiences fatigue, poor endurance compared to peers, and some right ankle pain due to multiple past sprains. He also struggles with anxiety and depression,  which he feels may be unrelated to his weight but acknowledges that his weight may contribute to these issues.  No history of high blood pressure, high cholesterol, fatty liver disease, diabetes, prediabetes, or insulin resistance. No frequent heartburn, reflux. Occasional exercise-induced asthma, similar to his father's experience. No known kidney disease, vitamin D deficiency, connective tissue disease, or leg swelling.  His family history includes obesity and sleep-disordered breathing. He lives with his father, who has had a complicated history with weight. He is not currently on any weight loss medications.  Highest weight: 235 lbs  Some associated conditions: OSA and Lung disease  Contributing factors: family history of obesity, disruption of circadian rhythm / sleep disordered breathing, consumption of processed foods, moderate to high levels of stress, reduced physical activity, chronic skipping of meals, mental health problems, strong orexigenic signaling and/or inadequate inhibitory control , slow metabolism for age, need for convenience due to lack of time, multiple weight loss attempts in the past, sedentary job, hectic pace of life, need for convenient foods, and self - critic or all-or-none mindset  Weight promoting medications identified: None  Prior weight loss attempts: Tracking and Journaling and Intermittent fasting  Current nutrition plan: None  Current level of physical activity: None  Current or previous pharmacotherapy: None and Is interested in pharmacotherapy if needed.   Response to medication: Never tried medications   Past medical history includes:   Past Medical History:  Diagnosis Date   ADHD    Anxiety and depression      Objective    BP 114/83   Pulse 74   Ht 5' 10 (1.778 m)   Wt 232 lb (105.2 kg)   SpO2 97%  BMI 33.29 kg/m  He was weighed on the bioimpedance scale: Body mass index is 33.29 kg/m.  Body Fat%:29.6%, Visceral Fat Rating:11,  Weight trend over the last 12 months: Unchanged  General:  Alert, oriented and cooperative. Patient is in no acute distress.  Respiratory: Normal respiratory effort, no problems with respiration noted   Gait: able to ambulate independently  Mental Status: Normal mood and affect. Normal behavior. Normal judgment and thought content.   DIAGNOSTIC DATA REVIEWED:  BMET No results found for: NA, K, CL, CO2, GLUCOSE, BUN, CREATININE, CALCIUM, GFRNONAA, GFRAA No results found for: HGBA1C No results found for: INSULIN CBC No results found for: WBC, RBC, HGB, HCT, PLT, MCV, MCH, MCHC, RDW Iron/TIBC/Ferritin/ %Sat No results found for: IRON, TIBC, FERRITIN, IRONPCTSAT Lipid Panel  No results found for: CHOL, TRIG, HDL, CHOLHDL, VLDL, LDLCALC, LDLDIRECT Hepatic Function Panel  No results found for: PROT, ALBUMIN, AST, ALT, ALKPHOS, BILITOT, BILIDIR, IBILI No results found for: TSH   Assessment and Plan   Attention deficit hyperactivity disorder (ADHD), unspecified ADHD type  Excessive daytime sleepiness  Hypersomnia with long sleep time, idiopathic  GAD (generalized anxiety disorder)  Snoring  Episodic sleep-wake schedule disorder, delayed phase type  Major depressive disorder, recurrent episode, moderate (HCC)  OSA on CPAP  Class 1 obesity due to excess calories with serious comorbidity and body mass index (BMI) of 33.0 to 33.9 in adult   Current BMI 33.3  Assessment and Plan Assessment & Plan  Obesity with visceral adiposity and disordered eating patterns Obesity with a weight of 232 lbs, visceral adiposity (visceral fat rating of 11), and disordered eating patterns (cravings, nighttime eating, skipping meals). Family history of obesity and sleep apnea. Potential insulin resistance and slow metabolism suspected. Muscle mass is 155 lbs with 68.8 lbs of adipose tissue, body fat percentage of 29%,  above the goal of 25%. - Initiate weight management program focusing on nutritional education and lifestyle changes. - Schedule follow-up visits every 2-3 weeks for the first three months. - Perform fasting labs and metabolism test at the first visit. - Discuss potential nutrition plans, including pescatarian, vegetarian, and low carb options. - Provide tools and strategies to address disordered eating patterns. - Discuss potential use of weight loss medications if necessary, with shared decision making.  Sleep apnea Sleep apnea may contribute to obesity and fatigue.  Right ankle pain with history of recurrent sprains Chronic right ankle pain with recurrent sprains, likely exacerbated by weight-bearing due to obesity.  Fatigue and poor endurance Fatigue and poor endurance potentially related to obesity, sleep apnea, and disordered eating patterns.  Anxiety and depression symptoms Anxiety and depression symptoms, possibly exacerbated by obesity. Currently taking Lexapro .       Obesity Treatment / Action Plan:  Patient will work on garnering support from family and friends to begin weight loss journey. Will work on eliminating or reducing the presence of highly palatable, calorie dense foods in the home. Will complete provided nutritional and psychosocial assessment questionnaire before the next appointment. Will be scheduled for indirect calorimetry to determine resting energy expenditure in a fasting state.  This will allow us  to create a reduced calorie, high-protein meal plan to promote loss of fat mass while preserving muscle mass. Will think about ideas on how to incorporate physical activity into their daily routine. Will work on reducing intake of added sugars, simple sugars and processed carbs. Will avoid skipping meals which may result in increased hunger signals and overeating at certain times. Will reduce  liquid calories and sugary drinks from diet. Will reduce the frequency  of eating out and making healthier choices by advanced menu planning. Will work on managing stress via relaxation methods as this may result in unhealthy eating patterns. Will work on reading labels, making healthier choices and watching portion sizes. Counseled on the health benefits of losing 5%-15% of total body weight. Will work on improving sleep hygiene and trying to obtain at least 7 hours of sleep. Was counseled on nutritional approaches to weight loss and benefits of reducing processed foods and consuming plant-based foods and high quality protein as part of nutritional weight management. Was counseled on pharmacotherapy and role as an adjunct in weight management.   Obesity Education Performed Today:  He was weighed on the bioimpedance scale and results were discussed and documented in the synopsis.  We discussed obesity as a disease and the importance of a more detailed evaluation of all the factors contributing to the disease.  We discussed the importance of long term lifestyle changes which include nutrition, exercise and behavioral modifications as well as the importance of customizing this to his specific health and social needs.  We discussed the benefits of reaching a healthier weight to alleviate the symptoms of existing conditions and reduce the risks of the biomechanical, metabolic and psychological effects of obesity.  We reviewed the four pillars of obesity medicine and importance of using a multimodal approach.  We reviewed the basic principles in weight management.   Yong Grieser appears to be in the action stage of change and states they are ready to start intensive lifestyle modifications and behavioral modifications.  I have spent 30 minutes in the care of the patient today including: 2 minutes before the visit reviewing and preparing the chart. 23 minutes face-to-face assessing and reviewing listed medical problems as outlined in obesity care plan, providing  nutritional and behavioral counseling on topics outlined in the obesity care plan, independently interpreting test results and goals of care, as described in assessment and plan, reviewing and discussing biometric information and progress, and reviewing latest PCP notes and specialist consultations 5 minutes after the visit updating chart and documentation of encounter.  Reviewed by clinician on day of visit: allergies, medications, problem list, medical history, surgical history, family history, social history, and previous encounter notes pertinent to obesity diagnosis.   Watt Geiler,PA-C

## 2023-09-12 ENCOUNTER — Ambulatory Visit: Admitting: Psychiatry

## 2023-09-12 DIAGNOSIS — F900 Attention-deficit hyperactivity disorder, predominantly inattentive type: Secondary | ICD-10-CM | POA: Diagnosis not present

## 2023-09-12 DIAGNOSIS — F3341 Major depressive disorder, recurrent, in partial remission: Secondary | ICD-10-CM | POA: Diagnosis not present

## 2023-09-12 DIAGNOSIS — Z720 Tobacco use: Secondary | ICD-10-CM

## 2023-09-12 DIAGNOSIS — F411 Generalized anxiety disorder: Secondary | ICD-10-CM | POA: Diagnosis not present

## 2023-09-12 DIAGNOSIS — F509 Eating disorder, unspecified: Secondary | ICD-10-CM

## 2023-09-12 DIAGNOSIS — F4010 Social phobia, unspecified: Secondary | ICD-10-CM | POA: Diagnosis not present

## 2023-09-12 NOTE — Progress Notes (Signed)
 Psychotherapy Progress Note [facility] [name], PhD LP  Patient ID: Louis [name] [facility])    MRN: [id number] Therapy format: Individual psychotherapy Date: 09/12/2023      Start: 11:06a     Stop: 11:53a     Time Spent: 47 min Location: In-person   Session narrative (presenting needs, interim history, self-report of stressors and symptoms, applications of prior therapy, status changes, and interventions made in session) Final semester, capstone project stressing him, hand still hurts enough to impair note taking.  Not enough to feel like he can't keep up, but enough to worry.  Support provided, turned to plans to care for hand and engage teammates in the capstone project to get it under way and started breaking down what it takes.  Similar issue socially, considering introducing himself to a new guy in class who seems cool, but holding back from reaching out to make conversation.  Supportively confronted self-limiting and assumption-making about how the semester will wind up crushing him.  Emphasized in general taking any constructive action, to push back on anxiety and provide realistic view of what is still ahead of him.  Agrees.  Body image at issue.  Has seen F undertake a wellness program after unsuccessful yoyo dieting.  Capers got his own consultation last month, f/u 3rd week this month.  Hopeful of feeling more comfortable and maybe more confident, not to mention, he may feel and think better shedding weight.  Knows he has a problem with bingeing at times on snack and convenience foods.  Suggested make a cheat sheet of things he can do instead of munch, as a way of getting ahead on the program.  Suggestions include exercise snacks, writing, vocalizing.   Aware of having a good muscle proportion reading but high organ fat.  Discussed carb concentration in his diet as a likely factor he can remediate, encouraged again to make it about taking positive steps over making  negative judgments.  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  Has iced and stretched, really relying on ibuprofen 2/day, not enough.  Not yet seen hand specialist he saw before but admits he could.  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  Challenged catastrophizing, urged to to go ahead and find out, he'll still have options what he lets get done, and it would be good to find out if a simple steroid shot can work the same as it did before, get him off the burner wondering and worrying.    Admits also that he still smokes regularly, and it's probably not doing him any favors.  Agreed it can exacerbate motivational and inflammatory issues, worth quitting.  Encouraged to a general practice, as with compulsive eating, to put something out before putting something in and try doing anything else for 15 min before deciding to light up.  Therapeutic modalities: Cognitive Behavioral Therapy, Solution-Oriented/Positive Psychology, and Ego-Supportive  Mental Status/Observations:  Appearance:   Casual     Behavior:  Appropriate  Motor:  Normal  Speech/Language:   Clear and Coherent  Affect:  Appropriate  Mood:  anxious  Thought process:  normal  Thought content:    WNL  Sensory/Perceptual disturbances:  WNL  Orientation:  Fully oriented  Attention:  Good    Concentration:  Good  Memory:  WNL  Insight:    Good  Judgment:   Good  Impulse Control:  Variable   Risk Assessment: Danger to Self: No Self-injurious Behavior: No Danger to Others: No Physical Aggression / Violence: No Duty to Warn: No Access to Firearms a concern: No  Assessment of progress:   stabilized  Diagnosis:   ICD-10-CM   1. Major depressive disorder, recurrent, in partial remission (HCC)  F33.41     2. 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 Generalized anxiety disorder  F41.1     4. Attention deficit hyperactivity disorder (ADHD), predominantly inattentive type  F90.0     5. Compulsive eating patterns (r/o carb addiction)  F50.9     6. Smoking, with interest in quitting  Z72.0      Plan:  Academic stress management -- as needed: Anti-procrastination -- self-remind of his purposes/goals, break down task requirements until he can willing take first step, and if unwilling to put in the work right now, be explicit with self about it Practice identifying tasks to go ahead with and work ahead, self-affirm he'll appreciate it soon enough vs. anxiety from piling up work Biomedical scientist self-motivation -- not trying harder but just do ___ then see how it goes.  Don't make it about having motivation but about applying the motivation he has for small, doable things to break inertia. 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  Notice moments of motivational paralysis and treat them same as music mistakes -- play through, a note at a time,  with full freedom to acknowledge and emote (e.g., scream internally, permission to say I'm really uncomfortable ...) while doing it.  Notice and rethink perfectionistic self-expectations and urges to rehearse self-blame for delayed progress in school.  As for existential issues, acknowledge then see through to action toward something meaningful, and count it good. Sleep and circadian management -- Resist staying up too late, accept a reasonable endpoint to the day.  Keep phone out of reach at bed.  Recommend blue light curfew or use actual orange lenses 1-2 hrs before bed.  Keep CPAP in good order and regular use, or reassess need. Metabolic self-regulation and nutrition -- Support in weight loss and overall healthy orientation.  Seek more movement/exercise.  Look into exercise snacks, especially if they will elevate HR or get winded for a bit.  May do double duty as desensitization to panic.  Continue driving down night snacking.  Continue practice of resisting night eating and doing something else at least 10 minutes, accepting going to bed a little hungry.  Continue shifting to more whole and prepared foods over convenience items.  Option to use MCT oil as the only calories after a  food curfew (e.g., 10pm) if munchies are too strong to resist, and keep real food for real daylight.  Given MTHFR, recommend add B complex supplement or go on and try methylated B12 and folic acid.  MVI probably also worthwhile, given issues and some degraded nutrition, and how supplemented micronutrients may help stem night hunger.  May need a general physical to rule out other issues. Attention and time awareness v. distraction -- Try 1-minute listenings as mindfulness practice.  Option practice estimating 1 minute elapsed without using a timepiece.  If time blindness in shower or elsewhere, use music or a clock to ground awareness of elapsed time. Communication with parents -- Continue agreement to let good night be good  night, without late interruptions or veiled late attempts to motivate or teach.  Cont to assert as needed if feedback is unnecessarily guilting, catastrophizing, dogpiling, or looping.  For pestering, lecturing, or Scientific laboratory technician (Mom, mainly), options to forgive and take it with a grain of salt, or ask an agreement to be short and blunt -- e.g., I'd rather hear Will you PLEASE take better care of my son?! -- than go on about what I should be doing, or how it just takes will power, or I'm not trying hard enough, etc.  Option as needed to ask M to reconsider whether she knows how it feels dealing with an issue she comments on, as F and B do, but willing to frame it as It is hard AND I am trying.  Option to notice and thank M for detectable efforts restraining tendencies, and option to ask her how she would tell if he is well enough aware and capable of managing risks or problems she sees on his behalf. Social/morale -- Continue exploring friend group, permission to realign and rightsize.  Ongoing option to challenge friends if they are harming themselves.  Option to re-explore dating.  Endorse interests not on screens and exploring communities of shared interest. Substances -- Resume smoking cessation, by way of nicotine gum or patch if necessary, but preferably by habitual delay tactics and behavioral substitution.  Recommend fully abstain from pot, but acceptable control with current use.  Keep alcohol moderate, bring up if need for further help is warranted. Health conditions -- assess possibility of fatty liver if indicated, reduce carb intake for multiple benefits, and treat de Quervain's as recommended, including antiinflammatory medication and diet, heat/ice as indicated, and any best practices for usage.  Do not presume helplessness or drastic treatments without fact-finding. Other recommendations/advice -- As may be noted above.  Continue to utilize previously learned skills ad lib. Medication  compliance -- Maintain medication as prescribed and work faithfully with relevant prescriber(s) if any changes are desired or seem indicated. Crisis service -- Aware of call list and work-in appts.  Call the clinic on-call service, 988/hotline, 911, or present to [facility] or ER if any life-threatening psychiatric crisis. Followup -- Return for time as already scheduled.  Next scheduled visit with me 09/26/2023.  Next scheduled in this office 09/26/2023.  Louis Kendall, PhD [name], PhD LP Clinical Psychologist, [facility] [address], [address] [place], [place] [address] [phone]

## 2023-09-15 ENCOUNTER — Telehealth: Payer: Self-pay | Admitting: Adult Health

## 2023-09-15 NOTE — Telephone Encounter (Signed)
 Pt called at 3:09p requesting refill of Methylphenidate  to   [facility] [id number] [place], [place] - [address] [place], [place] [address] Phone: [phone]  Fax: [phone]    Pt said he has not been taking it since school was out this summer.  But now school has started back and he is wanting to get back on it.    Next appt 9/18

## 2023-09-15 NOTE — Telephone Encounter (Signed)
 Pt has not been seen in almost a year. Told him that we could not send in a RF for his methylphenidate  until his appt with Tillman on 9/18. He was ok with that.

## 2023-09-25 ENCOUNTER — Encounter: Payer: Self-pay | Admitting: Adult Health

## 2023-09-25 ENCOUNTER — Ambulatory Visit: Admitting: Adult Health

## 2023-09-25 DIAGNOSIS — F331 Major depressive disorder, recurrent, moderate: Secondary | ICD-10-CM

## 2023-09-25 DIAGNOSIS — F900 Attention-deficit hyperactivity disorder, predominantly inattentive type: Secondary | ICD-10-CM

## 2023-09-25 DIAGNOSIS — F411 Generalized anxiety disorder: Secondary | ICD-10-CM

## 2023-09-25 MED ORDER — METHYLPHENIDATE HCL ER (OSM) 36 MG PO TBCR: 36.0000 mg | EXTENDED_RELEASE_TABLET | Freq: Every day | ORAL | 0 refills | Status: DC

## 2023-09-25 MED ORDER — METHYLPHENIDATE HCL ER (OSM) 54 MG PO TBCR: 54.0000 mg | EXTENDED_RELEASE_TABLET | ORAL | 0 refills | Status: DC

## 2023-09-25 MED ORDER — ESCITALOPRAM OXALATE 20 MG PO TABS: 20.0000 mg | ORAL_TABLET | Freq: Every day | ORAL | 2 refills | Status: DC

## 2023-09-25 NOTE — Progress Notes (Signed)
 Louis [name] [id number] [birth year] 28 y.o.  Subjective:   Patient ID:  Louis [name] is a 28 y.o. (DOB 06-05-1995) male.  Chief Complaint: No chief complaint on file.   HPI Louis [name] presents to the office today for follow-up of MDD, ADHD, GAD.  Describes mood today as ok. Pleasant. Denies tearfulness. Mood symptoms - reports a little depression and anxiety. Reports stable interest and motivation. Denies irritability. Denies recent panic attacks - bouts of anxiety. Reports some worry, ruminations, and over thinking - school stuff - the future. Reports mood is stable. Stating I feel like I'm doing ok for the most part. Feels like medications are helpful. Taking medications as prescribed.  Energy levels stable. Active, has a regular exercise routine x 2 days a week. Enjoys some usual interests and activities. Single. Not dating. Lives with parents and cat. Spending time with family and friends. Appetite adequate. Weight gain - 230 pounds. Sleeps well most nights. Averages 6 to 8 hours. Focus and concentration stable. Completing tasks. Managing aspects of household. Taking classes - [employer] - 12 hours. 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  Denies SI or HI.  Denies AH or VH. Denies self harm. Substance use - vapes nicotine. Denies THC. Reports minimal ETOH use.   GAD-7    Flowsheet Row Counselor from 08/14/2022 in [facility] Counselor from 03/16/2021 in [facility]  Total GAD-7 Score 12 14   PHQ2-9    Flowsheet Row Counselor from 08/14/2022 in [facility] Counselor from 03/16/2021 in [facility] Counselor from 05/03/2020 in [facility]  PHQ-2 Total Score 4 5 5   PHQ-9 Total Score 14 21 20      Review of Systems:  Review of Systems  Musculoskeletal:  Negative for gait problem.  Neurological:  Negative for tremors.   Psychiatric/Behavioral:         Please refer to HPI    Medications: I have reviewed the patient's current medications.  Current Outpatient Medications  Medication Sig Dispense Refill   escitalopram  (LEXAPRO ) 20 MG tablet Take 1 tablet (20 mg total) by mouth daily. 30 tablet 5   methylphenidate  (CONCERTA ) 36 MG PO CR tablet Take 1 tablet (36 mg total) by mouth daily. 30 tablet 0   No current facility-administered medications for this visit.    Medication Side Effects: None  Allergies: No Known Allergies  Past Medical History:  Diagnosis Date   ADHD    Anxiety and depression     Past Medical History, Surgical history, Social history, and Family history were reviewed and updated as appropriate.   Please see review of systems for further details on the patient's review from today.   Objective:   Physical Exam:  There were no vitals taken for this visit.  Physical Exam Constitutional:      General: He is not in acute distress. Musculoskeletal:        General: No deformity.  Neurological:     Mental Status: He is alert and oriented to person, place, and time.     Coordination: Coordination normal.  Psychiatric:        Attention and Perception: Attention and perception normal. He does not perceive auditory or visual hallucinations.        Mood and Affect: Mood normal. Mood is not anxious or depressed. Affect is not labile, blunt, angry or inappropriate.        Speech: Speech  normal.        Behavior: Behavior normal.        Thought Content: Thought content normal. Thought content is not paranoid or delusional. Thought content does not include homicidal or suicidal ideation. Thought content does not include homicidal or suicidal plan.        Cognition and Memory: Cognition and memory normal.        Judgment: Judgment normal.     Comments: Insight intact     Lab Review:  No results found for: NA, K, CL, CO2, GLUCOSE, BUN, CREATININE, CALCIUM, PROT,  ALBUMIN, AST, ALT, ALKPHOS, BILITOT, GFRNONAA, GFRAA  No results found for: WBC, RBC, HGB, HCT, PLT, MCV, MCH, MCHC, RDW, LYMPHSABS, MONOABS, EOSABS, BASOSABS  No results found for: POCLITH, LITHIUM   No results found for: PHENYTOIN, PHENOBARB, VALPROATE, CBMZ   .res Assessment: Plan:    Plan:  PDMP reviewed  D/C Trazadone 100mg  - one tab at hs  Lexapro  20mg  daily  Concerta  36mg  daily  Using Clonazepam infrequently - none needed today.  20 minutes spent dedicated to the care of this patient on the date of this encounter to include pre-visit review of records, ordering of medication, post visit documentation, and face-to-face time with the patient discussing depression, anxiety and ADD. Discussed continuing current medication regimen.  Monitor BP between visits while taking stimulant medication.   Working with Dr Marijean.  Genesight testing available.  RTC 3 months  Patient advised to contact office with any questions, adverse effects, or acute worsening in signs and symptoms.  Discussed potential benefits, risks, and side effects of stimulants with patient to include increased heart rate, palpitations, insomnia, increased anxiety, increased irritability, or decreased appetite.  Instructed patient to contact office if experiencing any significant tolerability issues.  There are no diagnoses linked to this encounter.   Please see After Visit Summary for patient specific instructions.  Future Appointments  Date Time Provider Department Center  09/26/2023 11:00 AM Marijean [place], PhD [facility] None  10/02/2023  8:00 AM Francyne Romano, MD [facility] None  10/16/2023  2:40 PM Francyne Romano, MD [facility] None  10/17/2023  2:00 PM Marijean Charleston, PhD [facility] None  10/31/2023 11:00 AM Marijean Charleston, PhD [facility] None  11/14/2023 11:00 AM Marijean Charleston, PhD [facility] None  11/28/2023 11:00 AM Marijean Charleston, PhD [facility] None    No  orders of the defined types were placed in this encounter.   -------------------------------

## 2023-09-26 ENCOUNTER — Ambulatory Visit (INDEPENDENT_AMBULATORY_CARE_PROVIDER_SITE_OTHER): Admitting: Psychiatry

## 2023-09-26 DIAGNOSIS — F331 Major depressive disorder, recurrent, moderate: Secondary | ICD-10-CM | POA: Diagnosis not present

## 2023-09-26 DIAGNOSIS — F4010 Social phobia, unspecified: Secondary | ICD-10-CM | POA: Diagnosis not present

## 2023-09-26 DIAGNOSIS — F900 Attention-deficit hyperactivity disorder, predominantly inattentive type: Secondary | ICD-10-CM | POA: Diagnosis not present

## 2023-09-26 NOTE — Progress Notes (Unsigned)
 Psychotherapy Progress Note [facility] [name], PhD LP  Patient ID: Louis [name] [facility])    MRN: [id number] Therapy format: Individual psychotherapy Date: 09/26/2023      Start: 11:09a     Stop: 11:56a     Time Spent: 47 min Location: In-person   Session narrative (presenting needs, interim history, self-report of stressors and symptoms, applications of prior therapy, status changes, and interventions made in session) Last seen 2 wks ago.  Review of EHR shows med check yesterday, after over 10 months, to renew escitalopram  and methylphenidate .  Noted minimal alcohol, abstinent THC, but nicotine vape remains.  Decisions made to d/c trazodone , f/u 3 mos.  Noted genetic testing available -- already known to have MTHFR.  In person, feeling some gastric distress.  Looking forward to mid-October trip with friends.  Parents going on 2.5 wk trip again soon.  Finds alone time at home kind of haunting -- recapitulates the isolation of COVID.  Validated the loneliness of it, and how paralyzing it can get.  Recognizes he can stifle himself asking for time.  Has not tried out the guy in his class who seemed like a prospect.  Encouraged again.  [employer] project has been very nervous with first progress presentation recently.  Struggles with impostor syndrome still, but sees more how he knows things, especially after watching the group ahead of him bomb their own presentation and reveal both poor comprehension and cheating with AI.  Assured he does have the right attitude and is not practicing impostorhood, only feeling it.  Confirms he has reliable partners in his group.  Will find himself holding back answering aloud unless 100% sure.  Encouraged to see if he can find some 95%s to go with for practice with confidence and for better chance of letting himself get recognized.  Sees periods of time socially anxious and then not over a long time.  Introvert throughout.    GI distress  acting up talking about it now, breaks session to use the restroom.  5 min later, resumed.    Coached in looking at holding back scenarios in general and seeing about nudging himself to take more chances.  Discussed appearance, also -- his chest length hair and very full beard -- being a shield of sorts for him feeling too vulnerable.  Planning, actually, to cut hair to shoulder length, figuring it both as social anxiety practice and a shrewd move for improving what signals people take in taking him seriously or not.  Weight as well, with full consultation with weight clinic next week.  Oct 2 to cut hair.  Affirmed and encouraged.  Therapeutic modalities: Cognitive Behavioral Therapy, Solution-Oriented/Positive Psychology, and Ego-Supportive  Mental Status/Observations:  Appearance:   Casual     Behavior:  Appropriate  Motor:  Normal  Speech/Language:   Clear and Coherent  Affect:  Appropriate  Mood:  anxious  Thought process:  normal  Thought content:    WNL  Sensory/Perceptual disturbances:    WNL  Orientation:  Fully oriented  Attention:  Good    Concentration:  Good  Memory:  WNL  Insight:    Good  Judgment:   Good  Impulse Control:  Good   Risk Assessment: Danger to Self: No Self-injurious Behavior: No Danger to Others: No Physical Aggression / Violence: No Duty to Warn: No Access to Firearms a concern: No  Assessment of progress:  progressing  Diagnosis:   ICD-10-CM   1. Major depressive disorder, recurrent episode, moderate (  HCC)  F33.1     2. 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 Attention deficit hyperactivity disorder (ADHD), predominantly inattentive type  F90.0      Plan:  Academic stress management -- as needed: Anti-procrastination -- self-remind of his purposes/goals, break down task requirements until he can willing take first step, and if unwilling to put in the work right now, be explicit with self about it Practice identifying tasks to go ahead with  and work ahead, self-affirm he'll appreciate it soon enough vs. anxiety from piling up work Biomedical scientist self-motivation -- not trying harder but just do ___ then see how it goes.  Don't make it about having motivation but about applying the motivation he has for small, doable things to break inertia. 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  Notice moments of motivational paralysis and treat them same as music mistakes -- play through, a note at a time, with full freedom to acknowledge and emote (e.g., scream internally, permission to say I'm really uncomfortable ...) while doing it.  Notice and rethink perfectionistic self-expectations and urges to rehearse self-blame for delayed progress in school.  Challenge impostor feelings by taking more risks to offer answers, opinions, input.  As for existential issues, acknowledge then see through to action toward something meaningful, and count it good. Sleep and circadian management -- Resist staying up too late, accept a reasonable endpoint to the day.  Keep phone out of reach at bed.  Recommend blue light curfew or use actual orange lenses 1-2 hrs before bed.  Keep CPAP in good order and regular use, or  reassess need. Metabolic self-regulation and nutrition -- Support in weight loss and overall healthy orientation.  Seek more movement/exercise.  Look into exercise snacks, especially if they will elevate HR or get winded for a bit.  May do double duty as desensitization to panic.  Continue driving down night snacking.  Continue practice of resisting night eating and doing something else at least 10 minutes, accepting going to bed a little hungry.  Continue shifting to more whole and prepared foods over convenience items.  Option to use MCT oil as the only calories after a food curfew (e.g., 10pm) if munchies are too strong to resist, and keep real food for real daylight.  Given MTHFR, recommend add B complex supplement or go on and try methylated B12 and folic acid.  MVI probably also worthwhile, given issues and some degraded nutrition, and how supplemented micronutrients may help stem night hunger.  May need a general physical to rule out other issues. Attention and time awareness v. distraction -- Try 1-minute listenings as mindfulness practice.  Option practice estimating 1 minute elapsed without using a timepiece.  If time blindness in shower or elsewhere, use music or a clock to ground awareness of elapsed  time. Communication with parents -- Continue agreement to let good night be good night, without late interruptions or veiled late attempts to motivate or teach.  Cont to assert as needed if feedback is unnecessarily guilting, catastrophizing, dogpiling, or looping.  For pestering, lecturing, or Scientific laboratory technician (Mom, mainly), options to forgive and take it with a grain of salt, or ask an agreement to be short and blunt -- e.g., I'd rather hear Will you PLEASE take better care of my son?! -- than go on about what I should be doing, or how it just takes will power, or I'm not trying hard enough, etc.  Option as needed to ask M to reconsider whether she knows how it feels dealing with an issue she  comments on, as F and B do, but willing to frame it as It is hard AND I am trying.  Option to notice and thank M for detectable efforts restraining tendencies, and option to ask her how she would tell if he is well enough aware and capable of managing risks or problems she sees on his behalf. Social/morale -- Continue exploring friend group, permission to realign and rightsize.  Ongoing option to challenge friends if they are harming themselves.  Option to re-explore dating.  Endorse interests not on screens and exploring communities of shared interest. Substances -- Resume smoking cessation, by way of nicotine gum or patch if necessary, but preferably by habitual delay tactics and behavioral substitution.  Recommend fully abstain from pot, but acceptable control with current use.  Keep alcohol moderate, bring up if need for further help is warranted. Health conditions -- assess possibility of fatty liver if indicated, reduce carb intake for multiple benefits, and treat de Quervain's as recommended, including antiinflammatory medication and diet, heat/ice as indicated, and any best practices for usage.  Do not presume helplessness or drastic treatments without fact-finding. Other recommendations/advice -- As may be noted above.  Continue to utilize previously learned skills ad lib. Medication compliance -- Maintain medication as prescribed and work faithfully with relevant prescriber(s) if any changes are desired or seem indicated. Crisis service -- Aware of call list and work-in appts.  Call the clinic on-call service, 988/hotline, 911, or present to [facility] or ER if any life-threatening psychiatric crisis. Followup -- Return for time as already scheduled.  Next scheduled visit with me 10/17/2023.  Next scheduled in this office 10/17/2023.  Lamar Kendall, PhD [name], PhD LP Clinical Psychologist, [facility] [address] [place], [place] [address] [phone]

## 2023-10-02 ENCOUNTER — Ambulatory Visit (INDEPENDENT_AMBULATORY_CARE_PROVIDER_SITE_OTHER): Payer: Self-pay | Admitting: Internal Medicine

## 2023-10-02 ENCOUNTER — Encounter (INDEPENDENT_AMBULATORY_CARE_PROVIDER_SITE_OTHER): Payer: Self-pay | Admitting: Internal Medicine

## 2023-10-02 VITALS — BP 111/77 | HR 69 | Temp 97.8°F | Ht 70.0 in | Wt 221.0 lb

## 2023-10-02 DIAGNOSIS — G4733 Obstructive sleep apnea (adult) (pediatric): Secondary | ICD-10-CM | POA: Diagnosis not present

## 2023-10-02 DIAGNOSIS — R0602 Shortness of breath: Secondary | ICD-10-CM | POA: Diagnosis not present

## 2023-10-02 DIAGNOSIS — E6609 Other obesity due to excess calories: Secondary | ICD-10-CM | POA: Insufficient documentation

## 2023-10-02 DIAGNOSIS — F5089 Other specified eating disorder: Secondary | ICD-10-CM

## 2023-10-02 DIAGNOSIS — F509 Eating disorder, unspecified: Secondary | ICD-10-CM | POA: Insufficient documentation

## 2023-10-02 DIAGNOSIS — Z1331 Encounter for screening for depression: Secondary | ICD-10-CM | POA: Diagnosis not present

## 2023-10-02 DIAGNOSIS — R5383 Other fatigue: Secondary | ICD-10-CM

## 2023-10-02 DIAGNOSIS — R638 Other symptoms and signs concerning food and fluid intake: Secondary | ICD-10-CM | POA: Insufficient documentation

## 2023-10-02 DIAGNOSIS — R948 Abnormal results of function studies of other organs and systems: Secondary | ICD-10-CM | POA: Diagnosis not present

## 2023-10-02 DIAGNOSIS — Z6833 Body mass index (BMI) 33.0-33.9, adult: Secondary | ICD-10-CM

## 2023-10-02 DIAGNOSIS — E66811 Obesity, class 1: Secondary | ICD-10-CM

## 2023-10-02 NOTE — Assessment & Plan Note (Signed)
 He has increased orexigenic signaling, impaired satiety and inhibitory control. This is secondary to an abnormal energy regulation system and pathological neurohormonal pathways characteristic of excess adiposity.  In addition to nutritional and behavioral strategies he may benefit from pharmacotherapy.

## 2023-10-02 NOTE — Progress Notes (Signed)
 [address],  [place], [place] [address]  Office: [phone]  /  Fax: [phone]   Subjective   Initial Visit  Louis [name] (MR# [id number]) is a 28 y.o. male who presents for evaluation and treatment of obesity and related comorbidities. Current BMI is Body mass index is 31.71 kg/m. Louis [name] has been struggling with his weight for many years and has been unsuccessful in either losing weight, maintaining weight loss, or reaching his healthy weight goal.  Louis [name] is currently in the action stage of change and ready to dedicate time achieving and maintaining a healthier weight. Louis [name] is interested in becoming our patient and working on intensive lifestyle modifications including (but not limited to) diet and exercise for weight loss.  Weight history:  He was referred by: Friend or Family- Father is patient of [facility]- Louis [name]   When asked what else they would like to accomplish? He states: Adopt a healthier eating pattern and lifestyle, Improve energy levels and physical activity, Improve existing medical conditions, Improve quality of life, Improve appearance, Improve self-confidence, and Lose 30-35 lbs   When asked how has your weight affected you? He states: Has affected self-esteem, Contributed to medical problems, Contributed to orthopedic problems or mobility issues, Having fatigue, Having poor endurance, Problems with eating patterns, and Has affected mood    Weight history: Louis [name] is a 28 year old male who presents for initial obesity treatment evaluation.   He has experienced progressive weight gain since high school, starting at approximately 190 pounds and increasing to 230-235 pounds over the past year or two. He attributes this to poor dietary habits, including cravings and nighttime eating, and a hectic lifestyle that leads to irregular eating patterns, such as skipping meals and overeating later.   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 He has attempted weight loss through tracking, journaling, and intermittent fasting but is not currently following any specific plan or exercising regularly.   He has a history of sleep apnea. He experiences fatigue, poor endurance compared to peers, and some right ankle pain due to multiple past sprains. He also struggles with anxiety and depression, which he feels may be unrelated to his weight but acknowledges that his weight may contribute to these issues.   No history of high blood pressure, high cholesterol, fatty liver disease, diabetes, prediabetes, or insulin  resistance. No frequent heartburn, reflux. Occasional exercise-induced asthma, similar to his father's experience. No known kidney disease, vitamin D  deficiency, connective tissue disease, or leg swelling.   His family history includes obesity and sleep-disordered breathing. He lives with his father, who has had a complicated history with weight. He is not currently on any weight loss medications.   Highest weight: 235 lbs   Some associated conditions: OSA and Lung disease   Contributing factors: family history of obesity, disruption of circadian rhythm / sleep disordered breathing, consumption of processed foods, moderate to high levels of stress, reduced physical activity, chronic skipping of meals, mental health problems, strong orexigenic signaling and/or inadequate inhibitory control , slow metabolism for age, need for convenience due to lack of time, multiple weight loss attempts in the past, sedentary job, hectic pace of life, need for convenient foods, and self - critic or all-or-none mindset   Weight promoting medications identified: None   Prior weight loss attempts: Tracking and Journaling and Intermittent fasting   Current nutrition plan: None  Current level of physical activity: None   Current or previous pharmacotherapy: None and Is  interested in pharmacotherapy if needed.    Response to medication: Never tried medications  Discussed the use of AI scribe software for clinical note transcription with the patient, who gave verbal consent to proceed.  History of Present Illness Louis [name] is a 29 year old male who presents for initial intake for medical weight management.  He has struggled with weight issues since a young age, which have significantly impacted his physical activities and self-image. He finds physical activities like hiking challenging, experiencing more strain than his older brother who is heavier. His negative self-image related to his weight has been a concern since high school.  There is a family history of weight-related issues, with both his father and brother struggling with weight. His father has had success with weight management at this clinic, which motivated him to seek care here. Additionally, there is a family history of cardiometabolic diseases, including high blood pressure, high cholesterol, stroke, cancer, and sleep apnea.  He has been diagnosed with mild sleep apnea and uses a CPAP machine, although he admits to inconsistent use due to discomfort. Symptoms of sleep apnea include snoring and daytime sleepiness, which he attributes in part to his ADHD. He reports a high sleepiness score and notes that his sleep schedule is poor, often eating less than four hours before sleep.  His eating habits include frequent fast food consumption, approximately six times a week, and a tendency to overeat at night. He has attempted various diets in the past, including low-carb diets, but has struggled to maintain weight loss. He identifies fast food and sugar as addictive and uses food as a coping mechanism for stress and emotions.  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 He drinks water regularly and avoids sodas,  consuming alcohol occasionally.    Nutritional History:  Current nutrition plan: None.  How many times do you eat outside the home: 5-7 per week  How often do they skip meals: skips breakfast  What beverages do they drink: water, juice, and alcohol.   Use of artificial sweetners : No  Food intolerances or dislikes: Asian food and seafood.  Food triggers: Stress, Boredom, When angry or upset, Seeking reward, When feeling guilty, To help comfort self, and When Sad.  Food cravings: Fast Food  Do they struggle with excessive hunger or portion control : Yes    Physical Activity:  Current level of physical activity: Low levels of physical activity at present  Barriers to Exercise: energy and does not enjoy   Past medical history includes:   Past Medical History:  Diagnosis Date   ADHD    Anxiety and depression    Asthma    Back pain    Drug use    Fatty liver    IBS (irritable bowel syndrome)    Joint pain    Shortness of breath    Sleep apnea      Objective   BP 111/77   Pulse 69   Temp 97.8 F (36.6 C)   Ht 5' 10 (1.778 m)   Wt 221 lb (100.2 kg)   SpO2 99%   BMI 31.71 kg/m  He was weighed on the bioimpedance scale: Body mass index is 31.71 kg/m.    Anthropometrics:  Vitals Temp: 97.8 F (36.6 C) BP: 111/77 Pulse Rate: 69 SpO2: 99 %  Anthropometric Measurements Height: 5' 10 (1.778 m) Weight: 221 lb (100.2 kg) BMI (Calculated): 31.71 Weight at Last Visit: N/A Weight Lost Since Last Visit: N/A Weight Gained Since Last Visit: N/A Starting Weight: 221 lb Peak Weight: 240 lb Waist Measurement : 42 inches   Body Composition  Body Fat %: 27.4 % Fat Mass (lbs): 60.8 lbs Muscle Mass (lbs): 152.8 lbs Total Body Water (lbs): 107.8 lbs Visceral Fat Rating : 10   Other Clinical Data Fasting: Yes Labs: Yes Today's Visit #: #1 Starting Date: 10/02/23    Physical Exam:  General: He is overweight, cooperative, alert, well developed,  and in no acute distress. PSYCH: Has normal mood, affect and thought process.   HEENT: EOMI, sclerae are anicteric. Lungs: Normal breathing effort, no conversational dyspnea. Extremities: No edema.  Neurologic: No gross sensory or motor deficits. No tremors or fasciculations noted.    Diagnostic Data Reviewed  EKG: Normal sinus rhythm, rate 59. No conduction abnormalities, abnormal Q waves or chamber enlargement.  Indirect Calorimeter completed today shows a VO2 of 279 and a REE of 1930.  His calculated basal metabolic rate is 7794 thus his resting energy expenditure slower than calculated.  Depression Screen  Louis [name] PHQ-9 score was: 13.     10/02/2023    7:23 AM  Depression screen PHQ 2/9  Decreased Interest 1  Down, Depressed, Hopeless 2  PHQ - 2 Score 3  Altered sleeping 1  Tired, decreased energy 3  Change in appetite 3  Feeling bad or failure about yourself  2  Trouble concentrating 1  Moving slowly or fidgety/restless 0  Suicidal thoughts 0  PHQ-9 Score 13    Screening for Sleep Related Breathing Disorders  He does not feel like he sleeps well gets around 6 hours wakes up still tired snores denies ever woken up gasping for air no one has ever set he stops breathing his Epworth was 16.  BMET No results found for: NA, K, CL, CO2, GLUCOSE, BUN, CREATININE, CALCIUM, GFRNONAA, GFRAA No results found for: HGBA1C No results found for: INSULIN  CBC No results found for: WBC, RBC, HGB, HCT, PLT, MCV, MCH, MCHC, RDW Iron/TIBC/Ferritin/ %Sat No results found for: IRON, TIBC, FERRITIN, IRONPCTSAT Lipid Panel  No results found for: CHOL, TRIG, HDL, CHOLHDL, VLDL, LDLCALC, LDLDIRECT Hepatic Function Panel  No results found for: PROT, ALBUMIN, AST, ALT, ALKPHOS, BILITOT, BILIDIR, IBILI No results found for: TSH   Assessment and Plan   TREATMENT PLAN FOR OBESITY:  Recommended Dietary  Goals  Louis [name] is currently in the action stage of change. As such, his goal is to implement medically supervised obesity management plan.  He has agreed to implement: the Category 3 plan - 1500 kcal per day  Behavioral Intervention  We discussed the following Behavioral Modification Strategies today: increasing lean protein intake to established goals, decreasing simple carbohydrates , increasing vegetables, increasing lower glycemic fruits, increasing fiber rich foods, avoiding skipping meals, increasing water intake, work on meal planning and preparation, work on tracking and journaling calories using tracking application, reading food labels , keeping healthy foods at home, identifying sources and decreasing liquid calories, decreasing eating out or consumption of processed foods, and making healthy choices when eating convenient foods, planning for success, and better snacking choices  Additional resources provided today: Handout on healthy eating and balanced plate, Handout on complex carbohydrates and lean sources of protein, Category 3 packet, and Handout principles of weight management.  Also given handouts on emotional hunger and the effects of  sleep apnea on weight  Recommended Physical Activity Goals  Louis [name] has been advised to work up to 150 minutes of moderate intensity aerobic activity a week and strengthening exercises 2-3 times per week for cardiovascular health, weight loss maintenance and preservation of muscle mass.   He has agreed to :  Think about enjoyable ways to increase daily physical activity and overcoming barriers to exercise, Increase physical activity in their day and reduce sedentary time (increase NEAT)., Increase volume of physical activity to a goal of 240 minutes a week, and Combine aerobic and strengthening exercises for efficiency and improved cardiometabolic health.  Medical Interventions and Pharmacotherapy We will work on building a Medical sales representative and behavioral strategies. We will discuss the role of pharmacotherapy as an adjunct at subsequent visits.   ASSOCIATED CONDITIONS ADDRESSED TODAY  Other Fatigue He does not feel like he sleeps well gets around 6 hours wakes up still tired snores denies ever woken up gasping for air no one has ever set he stops breathing his Epworth was 16.Louis [name] does feel that his weight is causing his energy to be lower than it should be. Fatigue may be related to obesity, depression or many other causes. Labs will be ordered, and in the meanwhile, Louis [name] will focus on self care including making healthy food choices, increasing physical activity and focusing on stress reduction.  Shortness of Breath Louis [name] notes increasing shortness of breath with physical activity and seems to be worsening over time with weight gain. He notes getting out of breath sooner with activity than he used to. This has not gotten worse recently. Louis [name] denies shortness of breath at rest or orthopnea.  Assessment & Plan SOBOE (shortness of breath on exertion)  Depression screening PHQ-9 of 13.  He has a history of difficulties with mental health and is currently on treatment.  Recommend following up with behavioral health specialist. OSA on CPAP Patient has an irregular sleep schedule and may have a degree of circadian dysrhythmia he also uses his PAP therapy inconsistently.  He was counseled on the bidirectional relationship between sleep apnea medical conditions.  Losing 50% of body weight may reduce AHI.  Encouraged to improve adherence with CPAP and stabilizing sleep schedule. Sleep disturbance characterized by poor sleep schedule and inadequate sleep duration. Contributing factors include inconsistent CPAP use, late-night eating, and possible ADHD-related sleepiness. Poor sleep may exacerbate attention issues and slow metabolism. - Advise on improving sleep hygiene, aiming for at least 7 hours of uninterrupted sleep per  night. - Recommend avoiding late-night eating to improve sleep quality. - Discuss the impact of sleep on metabolism and overall health. Class 1 obesity due to excess calories with serious comorbidity and body mass index (BMI) of 33.0 to 33.9 in adult Obesity with unhealthy eating behaviors and slow metabolic rate Obesity with central fat accumulation, body fat percentage of 27%, and a metabolic rate approximately 300 calories below expected. Eating behaviors include frequent fast food consumption, overeating at night, and emotional eating. Unsuccessful weight loss attempts and yo-yo dieting are noted. Family history of obesity and related cardiometabolic diseases. He is motivated to make lifestyle changes to prevent further health decline. - Order blood work to assess cardiometabolic health and vitamin deficiencies. - Initiate a nutritional plan targeting a calorie deficit of 1500 calories per day, emphasizing whole foods and protein intake. - Encourage tracking and journaling of food intake using apps like Lose It or My Fitness Pal. - Advise on reducing fast food consumption and making  healthier choices when eating out. - Recommend increasing physical activity, exploring enjoyable activities such as mountain biking or yoga. - Educate on the importance of sleep, physical activity, and protein intake in boosting metabolism. - Provide educational materials on processed foods, eating out, and balanced nutrition. Abnormal food appetite He has increased orexigenic signaling, impaired satiety and inhibitory control. This is secondary to an abnormal energy regulation system and pathological neurohormonal pathways characteristic of excess adiposity.  In addition to nutritional and behavioral strategies he may benefit from pharmacotherapy.   Eating disorder, unspecified type -emotional hunger Patient was given information regarding emotional hunger.  He will first work on Runner, broadcasting/film/video reduced  calorie nutrition plan we will then assess for cravings and orixegenic signaling.  He may benefit from counseling and also pharmacotherapy. Abnormal metabolism Patient has a slower than predicted metabolism. IC 1930 vs. calculated 2205. This may contribute to weight gain, chronic fatigue and difficulty losing weight.   We reviewed measures to improve metabolism including not skipping meals, progressive strengthening exercises, increasing protein intake at every meal and maintaining adequate hydration and sleep.       Follow-up  He was informed of the importance of frequent follow-up visits to maximize his success with intensive lifestyle modifications for his multiple health conditions. He was informed we would discuss his lab results at his next visit unless there is a critical issue that needs to be addressed sooner. Asiah agreed to keep his next visit at the agreed upon time to discuss these results.  Attestation Statement  This is the patient's intake visit at [facility]. The patient's Health Questionnaire was reviewed at length. Included in the packet: current and past health history, medications, allergies, ROS, gynecologic history (women only), surgical history, family history, social history, weight history, weight loss surgery history (for those that have had weight loss surgery), nutritional evaluation, mood and food questionnaire, PHQ9, Epworth questionnaire, sleep habits questionnaire, patient life and health improvement goals questionnaire. These will all be scanned into the patient's chart under media.   During the visit, I independently reviewed the patient's EKG, previous labs, bioimpedance scale results, and indirect calorimetry results. I used this information to medically tailor a meal plan for the patient that will help him to lose weight and will improve his obesity-related conditions. I performed a medically necessary appropriate examination and/or  evaluation. I discussed the assessment and treatment plan with the patient. The patient was provided an opportunity to ask questions and all were answered. The patient agreed with the plan and demonstrated an understanding of the instructions. Labs were ordered at this visit and will be reviewed at the next visit unless critical results need to be addressed immediately. Clinical information was updated and documented in the EMR.   In addition, they received basic education on identification of processed foods and reduction of these, different sources of lean proteins and complex carbohydrates and how to eat balanced by incorporation of whole foods.  Reviewed by clinician on day of visit: allergies, medications, problem list, medical history, surgical history, family history, social history, and previous encounter notes.  I have spent 57 minutes in the care of the patient today including: 5 minutes before the visit reviewing and preparing the chart. 42 minutes face-to-face assessing and reviewing listed medical problems as outlined in obesity care plan, providing nutritional and behavioral counseling on topics outlined in the obesity care plan, independently interpreting test results and goals of care, as described in assessment and plan, reviewing and discussing  biometric information and progress, and ordering diagnostics - see orders 10 minutes after the visit updating chart and documentation of encounter.       Louis Parker, MD

## 2023-10-02 NOTE — Assessment & Plan Note (Signed)
 Obesity with unhealthy eating behaviors and slow metabolic rate Obesity with central fat accumulation, body fat percentage of 27%, and a metabolic rate approximately 300 calories below expected. Eating behaviors include frequent fast food consumption, overeating at night, and emotional eating. Unsuccessful weight loss attempts and yo-yo dieting are noted. Family history of obesity and related cardiometabolic diseases. He is motivated to make lifestyle changes to prevent further health decline. - Order blood work to assess cardiometabolic health and vitamin deficiencies. - Initiate a nutritional plan targeting a calorie deficit of 1500 calories per day, emphasizing whole foods and protein intake. - Encourage tracking and journaling of food intake using apps like Lose It or My Fitness Pal. - Advise on reducing fast food consumption and making healthier choices when eating out. - Recommend increasing physical activity, exploring enjoyable activities such as mountain biking or yoga. - Educate on the importance of sleep, physical activity, and protein intake in boosting metabolism. - Provide educational materials on processed foods, eating out, and balanced nutrition.

## 2023-10-02 NOTE — Assessment & Plan Note (Signed)
 Patient has a slower than predicted metabolism. IC 1930 vs. calculated 2205. This may contribute to weight gain, chronic fatigue and difficulty losing weight.   We reviewed measures to improve metabolism including not skipping meals, progressive strengthening exercises, increasing protein intake at every meal and maintaining adequate hydration and sleep.

## 2023-10-02 NOTE — Assessment & Plan Note (Signed)
 Patient has an irregular sleep schedule and may have a degree of circadian dysrhythmia he also uses his PAP therapy inconsistently.  He was counseled on the bidirectional relationship between sleep apnea medical conditions.  Losing 50% of body weight may reduce AHI.  Encouraged to improve adherence with CPAP and stabilizing sleep schedule. Sleep disturbance characterized by poor sleep schedule and inadequate sleep duration. Contributing factors include inconsistent CPAP use, late-night eating, and possible ADHD-related sleepiness. Poor sleep may exacerbate attention issues and slow metabolism. - Advise on improving sleep hygiene, aiming for at least 7 hours of uninterrupted sleep per night. - Recommend avoiding late-night eating to improve sleep quality. - Discuss the impact of sleep on metabolism and overall health.

## 2023-10-02 NOTE — Assessment & Plan Note (Signed)
 Patient was given information regarding emotional hunger.  He will first work on Runner, broadcasting/film/video reduced calorie nutrition plan we will then assess for cravings and orixegenic signaling.  He may benefit from counseling and also pharmacotherapy.

## 2023-10-03 LAB — CBC WITH DIFFERENTIAL/PLATELET
Basophils Absolute: 0 x10E3/uL (ref 0.0–0.2)
Basos: 1 %
EOS (ABSOLUTE): 0.1 x10E3/uL (ref 0.0–0.4)
Eos: 1 %
Hematocrit: 50.1 % (ref 37.5–51.0)
Hemoglobin: 16.4 g/dL (ref 13.0–17.7)
Immature Grans (Abs): 0 x10E3/uL (ref 0.0–0.1)
Immature Granulocytes: 0 %
Lymphocytes Absolute: 1.9 x10E3/uL (ref 0.7–3.1)
Lymphs: 33 %
MCH: 28.5 pg (ref 26.6–33.0)
MCHC: 32.7 g/dL (ref 31.5–35.7)
MCV: 87 fL (ref 79–97)
Monocytes Absolute: 0.5 x10E3/uL (ref 0.1–0.9)
Monocytes: 8 %
Neutrophils Absolute: 3.4 x10E3/uL (ref 1.4–7.0)
Neutrophils: 57 %
Platelets: 302 x10E3/uL (ref 150–450)
RBC: 5.76 x10E6/uL (ref 4.14–5.80)
RDW: 12.9 % (ref 11.6–15.4)
WBC: 5.9 x10E3/uL (ref 3.4–10.8)

## 2023-10-03 LAB — CMP14+EGFR
ALT: 64 IU/L — ABNORMAL HIGH (ref 0–44)
AST: 31 IU/L (ref 0–40)
Albumin: 4.4 g/dL (ref 4.3–5.2)
Alkaline Phosphatase: 104 IU/L (ref 47–123)
BUN/Creatinine Ratio: 15 (ref 9–20)
BUN: 14 mg/dL (ref 6–20)
Bilirubin Total: 0.7 mg/dL (ref 0.0–1.2)
CO2: 22 mmol/L (ref 20–29)
Calcium: 9.7 mg/dL (ref 8.7–10.2)
Chloride: 105 mmol/L (ref 96–106)
Creatinine, Ser: 0.91 mg/dL (ref 0.76–1.27)
Globulin, Total: 2.5 g/dL (ref 1.5–4.5)
Glucose: 86 mg/dL (ref 70–99)
Potassium: 4.4 mmol/L (ref 3.5–5.2)
Sodium: 141 mmol/L (ref 134–144)
Total Protein: 6.9 g/dL (ref 6.0–8.5)
eGFR: 120 mL/min/1.73 (ref >59)

## 2023-10-03 LAB — LIPID PANEL WITH LDL/HDL RATIO
Cholesterol, Total: 189 mg/dL (ref 100–199)
HDL: 41 mg/dL (ref >39)
LDL Chol Calc (NIH): 124 mg/dL — ABNORMAL HIGH (ref 0–99)
LDL/HDL Ratio: 3 ratio (ref 0.0–3.6)
Triglycerides: 133 mg/dL (ref 0–149)
VLDL Cholesterol Cal: 24 mg/dL (ref 5–40)

## 2023-10-03 LAB — HEMOGLOBIN A1C
Est. average glucose Bld gHb Est-mCnc: 111 mg/dL
Hgb A1c MFr Bld: 5.5 % (ref 4.8–5.6)

## 2023-10-03 LAB — VITAMIN D 25 HYDROXY (VIT D DEFICIENCY, FRACTURES): Vit D, 25-Hydroxy: 11.9 ng/mL — ABNORMAL LOW (ref 30.0–100.0)

## 2023-10-03 LAB — INSULIN, RANDOM: INSULIN: 12.8 u[IU]/mL (ref 2.6–24.9)

## 2023-10-03 LAB — TSH: TSH: 3.36 u[IU]/mL (ref 0.450–4.500)

## 2023-10-03 LAB — VITAMIN B12: Vitamin B-12: 319 pg/mL (ref 232–1245)

## 2023-10-16 ENCOUNTER — Ambulatory Visit (INDEPENDENT_AMBULATORY_CARE_PROVIDER_SITE_OTHER): Admitting: Internal Medicine

## 2023-10-16 ENCOUNTER — Encounter (INDEPENDENT_AMBULATORY_CARE_PROVIDER_SITE_OTHER): Payer: Self-pay | Admitting: Internal Medicine

## 2023-10-16 VITALS — BP 106/69 | HR 73 | Temp 98.2°F | Ht 70.0 in | Wt 220.0 lb

## 2023-10-16 DIAGNOSIS — G4733 Obstructive sleep apnea (adult) (pediatric): Secondary | ICD-10-CM | POA: Diagnosis not present

## 2023-10-16 DIAGNOSIS — Z6833 Body mass index (BMI) 33.0-33.9, adult: Secondary | ICD-10-CM

## 2023-10-16 DIAGNOSIS — E559 Vitamin D deficiency, unspecified: Secondary | ICD-10-CM

## 2023-10-16 DIAGNOSIS — R948 Abnormal results of function studies of other organs and systems: Secondary | ICD-10-CM

## 2023-10-16 DIAGNOSIS — R7401 Elevation of levels of liver transaminase levels: Secondary | ICD-10-CM

## 2023-10-16 DIAGNOSIS — R638 Other symptoms and signs concerning food and fluid intake: Secondary | ICD-10-CM | POA: Diagnosis not present

## 2023-10-16 DIAGNOSIS — F901 Attention-deficit hyperactivity disorder, predominantly hyperactive type: Secondary | ICD-10-CM

## 2023-10-16 DIAGNOSIS — E88819 Insulin resistance, unspecified: Secondary | ICD-10-CM

## 2023-10-16 DIAGNOSIS — E6609 Other obesity due to excess calories: Secondary | ICD-10-CM

## 2023-10-16 DIAGNOSIS — E66811 Obesity, class 1: Secondary | ICD-10-CM

## 2023-10-16 MED ORDER — VITAMIN D (ERGOCALCIFEROL) 1.25 MG (50000 UNIT) PO CAPS: 50000.0000 [IU] | ORAL_CAPSULE | ORAL | 0 refills | Status: DC

## 2023-10-16 NOTE — Assessment & Plan Note (Signed)
 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 Insulin  levels are slightly above optimal, indicating early insulin  resistance. LDL cholesterol is elevated at 124 mg/dL, with a target of less than 100 mg/dL for cardiovascular prevention. - Discussed the role of metformin in weight management, which can help with cravings, stabilize blood sugar, and promote healthy gut bacteria. - Discussed GLP-1 receptor agonists as an option for long-term weight management, but noted potential insurance coverage issues and the need for long-term use to maintain weight loss. - Emphasized the importance of exercise as a form of medicine to improve metabolic rate, mental health, and weight management. - Research metformin and topiramate for weight management. - Investigate insurance coverage for GLP-1 receptor agonists (Wegovy, Zepbound). - Engage in regular exercise, aiming for 240 minutes per week of combined endurance and strengthening activities. - Increase daily steps to 5,000-10,000. - Consider home exercise options if gym access is a barrier.

## 2023-10-16 NOTE — Assessment & Plan Note (Signed)
 Patient has a slower than predicted metabolism. IC 1930 vs. calculated 2205. This may contribute to weight gain, chronic fatigue and difficulty losing weight.   We reviewed measures to improve metabolism including not skipping meals, progressive strengthening exercises, increasing protein intake at every meal and maintaining adequate hydration and sleep.

## 2023-10-16 NOTE — Assessment & Plan Note (Signed)
 He has an ALT in the 60s have been evaluated for this in the past had a normal liver ultrasound 4 to 5 years ago negative celiac.  No record of hepatitis serologies or iron studies.  We will repeat liver enzymes at the next visit include a GGT, hepatitis serologies and iron studies.  Patient has been advised to reduce simple and added sugars and saturated fats in the diet.  He does not drink alcohol on a regular basis.  No risk factors for autoimmune hepatitis.  Cannot exclude medication induced

## 2023-10-16 NOTE — Assessment & Plan Note (Signed)
 Most recent vitamin D  levels  Lab Results  Component Value Date   VD25OH 11.9 (L) 10/02/2023     Deficiency state associated with adiposity and may result in leptin resistance, weight gain and fatigue.   Plan: After discussion of benefits, alternative treatment options and side effects patient will be started on vitamin D2 50,000 units 1 tablet weekly for 3-4 months. for a treatment goal level of 50-60 mg/dl. Check levels at that time for response monitoring.

## 2023-10-16 NOTE — Progress Notes (Signed)
 Office: [phone]  /  Fax: [phone]  Weight Summary and Body Composition Analysis (BIA)  Vitals Temp: 98.2 F (36.8 C) BP: 106/69 Pulse Rate: 73 SpO2: 98 %   Anthropometric Measurements Height: 5' 10 (1.778 m) Weight: 220 lb (99.8 kg) BMI (Calculated): 31.57 Weight at Last Visit: 221 lb Weight Lost Since Last Visit: 1 lb Weight Gained Since Last Visit: 0 Starting Weight: 221 lb Total Weight Loss (lbs): 1 lb (0.454 kg) Peak Weight: 240 lb   Body Composition  Body Fat %: 27.9 % Fat Mass (lbs): 61.4 lbs Muscle Mass (lbs): 151.2 lbs Total Body Water (lbs): 108.8 lbs Visceral Fat Rating : 10    No data recorded Today's Visit #: 2  Starting Date: 10/02/23   Subjective   Chief Complaint: Obesity  Interval History Discussed the use of AI scribe software for clinical note transcription with the patient, who gave verbal consent to proceed.  History of Present Illness Louis [name] is a 28 year old male with obesity who presents for weight management consultation.  Since last office visit he has lost 1 pound he reports following 1500-calorie nutrition plan only 40% of the time.  He has not been exercising he does not track has been increasing more whole foods getting the recommended amount of protein maintaining adequate hydration has been more consistent with his sleep.  He has been attempting dietary changes, benefiting from his mother's health-conscious cooking. He sometimes skips breakfast but tries to have a protein shake and fruit in the morning. 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  He is currently taking Lexapro  daily and clonazepam as needed for panic attacks, which are not frequent. He feels that his anxiety is more prominent than depression at this stage, possibly exacerbated by his ADHD  medication. His escitalopram  has been effective in managing depression.  He has a history of elevated liver enzymes, specifically ALT, noted in 2021. An abdominal ultrasound at that time was normal. He does not consume alcohol regularly and was not drinking prior to the recent blood test. He has not been tested for hepatitis or iron overload in the past.  He has mild sleep apnea with an AHI of 8.8 during REM sleep and 7 during non-REM sleep. His father also has sleep apnea, which has improved with weight loss.  Recent blood work shows an elevated LDL cholesterol of 124 mg/dL and low vitamin D  levels. His B12 levels are normal but on the low side, and his A1c is 5.5%, indicating no prediabetes. His fasting insulin  level is slightly elevated.  He acknowledges challenges with exercise, citing insecurity and perceived time constraints as barriers. He previously had access to a gym through his parents' membership but now considers options like the [employer] or his university gym. He recognizes the benefits of exercise for mental health and weight management but struggles with motivation and time management.     Challenges affecting patient progress: strong hunger signals and/or impaired satiety / inhibitory control, low volume of physical activity at present , inadequate sleep , and emotional eating.    Pharmacotherapy for weight management: He is currently taking no anti-obesity medication and states interest in starting a medication to aid with weight loss citing difficulty with maintaining a reduced calorie state and weight loss.   Assessment and Plan   Treatment Plan For Obesity:  Recommended Dietary  Goals  Louis [name] is currently in the action stage of change. As such, his goal is to continue weight management plan. He has agreed to: incorporate prepackaged healthy meals for convenience, incorporate 1-2 meal replacements a day for convenience , and continue current plan  Behavioral Health and  Counseling  We discussed the following behavioral modification strategies today: continue to work on maintaining a reduced calorie state, getting the recommended amount of protein, incorporating whole foods, making healthy choices, staying well hydrated and practicing mindfulness when eating. and increase protein intake, fibrous foods (25 grams per day for women, 30 grams for men) and water to improve satiety and decrease hunger signals. .  Additional education and resources provided today: Handout on increasing daily activity and exercise goal setting  Recommended Physical Activity Goals  Louis [name] has been advised to work up to 150 minutes of moderate intensity aerobic activity a week and strengthening exercises 2-3 times per week for cardiovascular health, weight loss maintenance and preservation of muscle mass.  He has agreed to :  Think about enjoyable ways to increase daily physical activity and overcoming barriers to exercise, Increase physical activity in their day and reduce sedentary time (increase NEAT)., Increase volume of physical activity to a goal of 240 minutes a week, and Combine aerobic and strengthening exercises for efficiency and improved cardiometabolic health.  Medical Interventions and Pharmacotherapy  We discussed various medication options to help Louis [name] with his weight loss efforts and we both agreed to : Continue with current nutritional and behavioral strategies, Was educated on available treatment options, including pharmacotherapy with emphasis on potential side effects and other available medical interventions, and Was educated on GLP-1 receptor agonists, their role in managing obesity-related conditions and commonly associated side effects.   Associated Conditions Impacted by Obesity Treatment  Assessment & Plan Vitamin D  deficiency Most recent vitamin D  levels  Lab Results  Component Value Date   VD25OH 11.9 (L) 10/02/2023     Deficiency state associated  with adiposity and may result in leptin resistance, weight gain and fatigue.   Plan: After discussion of benefits, alternative treatment options and side effects patient will be started on vitamin D2 50,000 units 1 tablet weekly for 3-4 months. for a treatment goal level of 50-60 mg/dl. Check levels at that time for response monitoring.  Abnormal craving -emotional hunger Depression and anxiety currently managed with escitalopram  and clonazepam as needed. Anxiety is more prominent than depression at this stage. - Discussed potential medication options for emotional hunger, but noted concerns about exacerbating anxiety with certain medications like bupropion. - Continue escitalopram  and clonazepam as needed. - Consider metformin or topiramate for emotional hunger management. - Counseling provided in regards to the management of emotional hunger OSA on CPAP Mild, does not qualify for Zepbound.  Losing 15% of body weight may reduce AHI. 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 Insulin  levels are slightly above optimal, indicating early insulin  resistance. LDL cholesterol is elevated at 124 mg/dL, with a target of less than 100 mg/dL for cardiovascular prevention. - Discussed the role of metformin in weight management, which can help with cravings, stabilize blood sugar, and promote healthy gut bacteria. - Discussed GLP-1 receptor agonists as an option for long-term weight management, but noted potential insurance coverage issues and the need for long-term use to maintain weight loss. -  Emphasized the importance of exercise as a form of medicine to improve metabolic rate, mental health, and weight management. - Research metformin and topiramate for weight management. - Investigate insurance  coverage for GLP-1 receptor agonists (Wegovy, Zepbound). - Engage in regular exercise, aiming for 240 minutes per week of combined endurance and strengthening activities. - Increase daily steps to 5,000-10,000. - Consider home exercise options if gym access is a barrier. Attention deficit hyperactivity disorder (ADHD), predominantly hyperactive type On Concerta .  Continue sympathomimetic as per prescribed by managing team Abnormal metabolism Patient has a slower than predicted metabolism. IC 1930 vs. calculated 2205. This may contribute to weight gain, chronic fatigue and difficulty losing weight.   We reviewed measures to improve metabolism including not skipping meals, progressive strengthening exercises, increasing protein intake at every meal and maintaining adequate hydration and sleep.   Elevated ALT measurement He has an ALT in the 60s have been evaluated for this in the past had a normal liver ultrasound 4 to 5 years ago negative celiac.  No record of hepatitis serologies or iron studies.  We will repeat liver enzymes at the next visit include a GGT, hepatitis serologies and iron studies.  Patient has been advised to reduce simple and added sugars and saturated fats in the diet.  He does not drink alcohol on a regular basis.  No risk factors for autoimmune hepatitis.  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 Optimal level < 1.9.   This is complex condition associated with genetics, ectopic fat and lifestyle factors. Insulin  resistance may result in increased fat storage, inhibition of the breakdown of fat, cause fluctuations in blood sugar leading to energy crashes and increased cravings for sugary or high carb foods and cause metabolic slowdown making it difficult to lose weight.  This may result in additional weight gain and lead to pre-diabetes and diabetes if untreated. In addition, hyperinsulinemia increases  cardiovascular risk, chronic inflammatory response and may increase the risk of obesity related malignancies.  Lab Results  Component Value Date   HGBA1C 5.5 10/02/2023   Lab Results  Component Value Date   INSULIN  12.8 10/02/2023   Lab Results  Component Value Date   GLUCOSE 86 10/02/2023    We reviewed treatment options which include losing 7 to 10% of body weight, increasing volume of physical activity and maintaining a diet low in saturated fats and with a low glycemic load.  Patient has also been educated on the carb insulin  model of obesity.  He may also be a candidate for pharmacoprophylaxis with metformin and / or GLP1 medication.        Recording duration 38 minutes      Objective   Physical Exam:  Blood pressure 106/69, pulse 73, temperature 98.2 F (36.8 C), height 5' 10 (1.778 m), weight 220 lb (99.8 kg), SpO2 98%. Body mass index is 31.57 kg/m.  General: He is overweight, cooperative, alert, well developed, and in no acute distress. PSYCH: Has normal mood, affect and thought process.   HEENT: EOMI, sclerae are anicteric. Lungs: Normal breathing effort, no conversational dyspnea. Extremities: No edema.  Neurologic: No gross sensory or motor deficits. No tremors or fasciculations noted.    Diagnostic Data Reviewed:  BMET    Component Value Date/Time   NA 141 10/02/2023 0910   K 4.4 10/02/2023 0910   CL 105 10/02/2023 0910   CO2 22 10/02/2023 0910   GLUCOSE 86 10/02/2023 0910   BUN  14 10/02/2023 0910   CREATININE 0.91 10/02/2023 0910   CALCIUM 9.7 10/02/2023 0910   Lab Results  Component Value Date   HGBA1C 5.5 10/02/2023   Lab Results  Component Value Date   INSULIN  12.8 10/02/2023   Lab Results  Component Value Date   TSH 3.360 10/02/2023   CBC    Component Value Date/Time   WBC 5.9 10/02/2023 0910   RBC 5.76 10/02/2023 0910   HGB 16.4 10/02/2023 0910   HCT 50.1 10/02/2023 0910   PLT 302 10/02/2023 0910   MCV 87 10/02/2023  0910   MCH 28.5 10/02/2023 0910   MCHC 32.7 10/02/2023 0910   RDW 12.9 10/02/2023 0910   Iron Studies No results found for: IRON, TIBC, FERRITIN, IRONPCTSAT Lipid Panel     Component Value Date/Time   CHOL 189 10/02/2023 0910   TRIG 133 10/02/2023 0910   HDL 41 10/02/2023 0910   LDLCALC 124 (H) 10/02/2023 0910   Hepatic Function Panel     Component Value Date/Time   PROT 6.9 10/02/2023 0910   ALBUMIN 4.4 10/02/2023 0910   AST 31 10/02/2023 0910   ALT 64 (H) 10/02/2023 0910   ALKPHOS 104 10/02/2023 0910   BILITOT 0.7 10/02/2023 0910      Component Value Date/Time   TSH 3.360 10/02/2023 0910   Nutritional Lab Results  Component Value Date   VD25OH 11.9 (L) 10/02/2023    Medications: Outpatient Encounter Medications as of 10/16/2023  Medication Sig   clonazePAM (KLONOPIN) 0.5 MG tablet Take 0.5 mg by mouth as needed for anxiety.   escitalopram  (LEXAPRO ) 20 MG tablet Take 1 tablet (20 mg total) by mouth daily.   methylphenidate  (CONCERTA ) 36 MG PO CR tablet Take 1 tablet (36 mg total) by mouth daily.   [START ON 10/23/2023] methylphenidate  (CONCERTA ) 36 MG PO CR tablet Take 1 tablet (36 mg total) by mouth daily.   Vitamin D , Ergocalciferol , (DRISDOL) 1.25 MG (50000 UNIT) CAPS capsule Take 1 capsule (50,000 Units total) by mouth every 7 (seven) days.   [START ON 11/20/2023] methylphenidate  (CONCERTA ) 54 MG PO CR tablet Take 1 tablet (54 mg total) by mouth every morning. (Patient not taking: Reported on 10/16/2023)   No facility-administered encounter medications on file as of 10/16/2023.     Follow-Up   Return in about 3 weeks (around 11/06/2023) for For Weight Mangement with Dr. Francyne.[email or web address] He was informed of the importance of frequent follow up visits to maximize his success with intensive lifestyle modifications for his multiple health conditions.  Attestation Statement   Reviewed by clinician on day of visit: allergies, medications, problem list, medical  history, surgical history, family history, social history, and previous encounter notes.   I have spent 47 minutes in the care of the patient today including: 2 minutes before the visit reviewing and preparing the chart. 38 minutes face-to-face assessing and reviewing listed medical problems as outlined in obesity care plan, providing nutritional and behavioral counseling on topics outlined in the obesity care plan, counseling regarding anti-obesity medication as outlined in obesity care plan, independently interpreting test results and goals of care, as described in assessment and plan, reviewing and discussing biometric information and progress, and ordering medications - see orders 7 minutes after the visit updating chart and documentation of encounter.    Lucas Francyne, MD

## 2023-10-16 NOTE — Assessment & Plan Note (Signed)
 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 Optimal level < 1.9.   This is complex condition associated with genetics, ectopic fat and lifestyle factors. Insulin  resistance may result in increased fat storage, inhibition of the breakdown of fat, cause fluctuations in blood sugar leading to energy crashes and increased cravings for sugary or high carb foods and cause metabolic slowdown making it difficult to lose weight.  This may result in additional weight gain and lead to pre-diabetes and diabetes if untreated. In addition, hyperinsulinemia increases cardiovascular risk, chronic inflammatory response and may increase the risk of obesity related malignancies.  Lab Results  Component Value Date   HGBA1C 5.5 10/02/2023   Lab Results  Component Value Date   INSULIN  12.8 10/02/2023   Lab Results  Component Value Date   GLUCOSE 86 10/02/2023    We reviewed treatment options which include losing 7 to 10% of body weight, increasing volume of physical activity and maintaining a diet low in saturated fats and with a low glycemic load.  Patient has also been educated on the carb insulin  model of obesity.  He may also be a candidate for pharmacoprophylaxis with metformin and / or GLP1 medication.

## 2023-10-16 NOTE — Assessment & Plan Note (Signed)
 Mild, does not qualify for Zepbound.  Losing 15% of body weight may reduce AHI.

## 2023-10-16 NOTE — Assessment & Plan Note (Signed)
 Depression and anxiety currently managed with escitalopram  and clonazepam as needed. Anxiety is more prominent than depression at this stage. - Discussed potential medication options for emotional hunger, but noted concerns about exacerbating anxiety with certain medications like bupropion. - Continue escitalopram  and clonazepam as needed. - Consider metformin or topiramate for emotional hunger management. - Counseling provided in regards to the management of emotional hunger

## 2023-10-16 NOTE — Assessment & Plan Note (Signed)
 On Concerta .  Continue sympathomimetic as per prescribed by managing team

## 2023-10-17 ENCOUNTER — Ambulatory Visit: Admitting: Psychiatry

## 2023-10-31 ENCOUNTER — Ambulatory Visit (INDEPENDENT_AMBULATORY_CARE_PROVIDER_SITE_OTHER): Admitting: Psychiatry

## 2023-10-31 DIAGNOSIS — F4010 Social phobia, unspecified: Secondary | ICD-10-CM | POA: Diagnosis not present

## 2023-10-31 DIAGNOSIS — Z1589 Genetic susceptibility to other disease: Secondary | ICD-10-CM | POA: Diagnosis not present

## 2023-10-31 DIAGNOSIS — E559 Vitamin D deficiency, unspecified: Secondary | ICD-10-CM

## 2023-10-31 DIAGNOSIS — F3341 Major depressive disorder, recurrent, in partial remission: Secondary | ICD-10-CM

## 2023-10-31 DIAGNOSIS — F900 Attention-deficit hyperactivity disorder, predominantly inattentive type: Secondary | ICD-10-CM

## 2023-10-31 NOTE — Progress Notes (Signed)
 Psychotherapy Progress Note [facility] [name], PhD LP  Patient ID: Louis [name] [facility])    MRN: [id number] Therapy format: Individual psychotherapy Date: 10/31/2023      Start: 11:07a     Stop: 11:56a     Time Spent: 49 min Location: In-person   Session narrative (presenting needs, interim history, self-report of stressors and symptoms, applications of prior therapy, status changes, and interventions made in session) Missed last time for being preoccupied with school and future and a visit the previous day for weight & wellness.  Still worried about future and afflicted with pessimism about the job market, partly with awareness of what AI could do to displace his profession, and what powers AI might be able to take on for itself eventually.  Been concerned with safety testing and things like Grok/XAI being trained to provide political/polemic answers, and how safety testing has shown the majority of time AI models trying to act for self-preservation (e.g., by lying, slandering, or otherwise manipulating conditions to prevent its perceived end).  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  Does harbor  a healthy mistrust of AI.  Considering one piece of advice to get master's -- if so, he'd get MBA.    Semester is proceeding, looks favorable to complete his degree.  Has realized he doesn't have to outcompete everybody to get a job, either.    Good social development going [place] mtns in [place] with 10-12 friends in cars to drive the [employer] the [employer].  Fun time, and a video brought back of a guy in a rented Chinchilla crossing the center line and clipping a [employer].  Healthwise, went to [facility] Well[facility], identified with vit D deficiency, is on high dose supplement x 4 wks.  Validated it makes good sense for him to be low, and for deficiency to help explain some of his sticky anxiety and  depression.  Therapeutic modalities: Cognitive Behavioral Therapy and Solution-Oriented/Positive Psychology  Mental Status/Observations:  Appearance:   Casual     Behavior:  Appropriate  Motor:  Normal  Speech/Language:   Clear and Coherent  Affect:  Appropriate  Mood:  normal  Thought process:  normal  Thought content:    WNL  Sensory/Perceptual disturbances:    WNL  Orientation:  Fully oriented  Attention:  Good    Concentration:  Good  Memory:  WNL  Insight:    Good  Judgment:   Good  Impulse Control:  Good   Risk Assessment: Danger to Self: No Self-injurious Behavior: No Danger to Others: No Physical Aggression / Violence: No Duty to Warn: No Access to Firearms a concern: No  Assessment of progress:  progressing  Diagnosis:   ICD-10-CM   1. Major depressive disorder, recurrent, in partial remission  F33.41     2. 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 Attention deficit hyperactivity disorder (ADHD), predominantly inattentive type  F90.0     4. MTHFR gene mutation  Z15.89     5. Vitamin D  deficiency  E55.9      Plan:  Academic stress management -- as needed: Anti-procrastination -- self-remind of his purposes/goals, break down task requirements until he can willing take first step, and if unwilling to put in the work right now, be explicit with self about it Practice identifying tasks to go ahead with and work ahead, self-affirm he'll appreciate it soon enough vs. 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  Don't make it about having motivation but about applying the motivation he has for small, doable things to break inertia. 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  Notice moments of motivational paralysis and treat them same as music mistakes -- play through, a note at a time, with full freedom to acknowledge and emote (e.g., scream internally, permission to say I'm really uncomfortable ...) while doing it.  Notice and rethink perfectionistic self-expectations and urges to rehearse self-blame for delayed progress in school.  Challenge impostor feelings by taking more risks to offer answers, opinions, input.  As for existential issues, acknowledge then see through to action toward something meaningful, and count it good. Sleep and circadian management -- Resist staying up too late, accept a reasonable endpoint to the day.  Keep phone out of reach at bed.  Recommend blue light curfew or use actual orange lenses 1-2 hrs before bed.  Keep CPAP in good order and regular use, or reassess need. Metabolic self-regulation and nutrition -- Support in weight loss and overall healthy orientation.  Seek more movement/exercise.  Look into exercise snacks, especially if they will elevate HR or get winded for a bit.  May do double duty as desensitization to panic.  Continue driving down night snacking.  Continue practice of resisting night eating and doing something else at least 10 minutes, accepting going to bed a little hungry.  Continue shifting to more whole and prepared foods over convenience items.  Option to use MCT oil as the only calories after  a food curfew (e.g., 10pm) if munchies are too strong to resist, and keep real food for real daylight.  Given MTHFR, recommend add B complex supplement or go on and try methylated B12 and folic acid.  MVI probably also worthwhile, given issues and some degraded nutrition, and how supplemented micronutrients may help stem night hunger.  May need a general physical to rule out other issues. Attention and time awareness v. distraction -- Try 1-minute listenings as mindfulness practice.  Option practice estimating 1 minute elapsed without using a timepiece.  If time blindness in shower or elsewhere, use music or a clock to ground awareness of elapsed time. Communication with parents -- Continue agreement to let good night be good night, without late interruptions or veiled late attempts to motivate or teach.  Cont to assert as needed if feedback is unnecessarily guilting, catastrophizing, dogpiling, or looping.  For pestering, lecturing, or overworry (Mom, mainly), options to forgive and take it with a grain of salt, or ask an agreement to be short and blunt -- e.g., I'd rather hear Will you PLEASE take better care of my son?! -- than go on about what I should be doing,  or how it just takes will power, or I'm not trying hard enough, etc.  Option as needed to ask M to reconsider whether she knows how it feels dealing with an issue she comments on, as F and B do, but willing to frame it as It is hard AND I am trying.  Option to notice and thank M for detectable efforts restraining tendencies, and option to ask her how she would tell if he is well enough aware and capable of managing risks or problems she sees on his behalf. Social/morale -- Continue exploring friend group, permission to realign and rightsize.  Ongoing option to challenge friends if they are harming themselves.  Option to re-explore dating.  Endorse interests not on screens and exploring communities of shared interest. Substances -- Resume  smoking cessation, by way of nicotine gum or patch if necessary, but preferably by habitual delay tactics and behavioral substitution.  Recommend fully abstain from pot, but acceptable control with current use.  Keep alcohol moderate, bring up if need for further help is warranted. Health conditions -- assess possibility of fatty liver if indicated, reduce carb intake for multiple benefits, and treat de Quervain's as recommended, including antiinflammatory medication and diet, heat/ice as indicated, and any best practices for usage.  Do not presume helplessness or drastic treatments without fact-finding. Other recommendations/advice -- As may be noted above.  Continue to utilize previously learned skills ad lib. Medication compliance -- Maintain medication as prescribed and work faithfully with relevant prescriber(s) if any changes are desired or seem indicated. Crisis service -- Aware of call list and work-in appts.  Call the clinic on-call service, 988/hotline, 911, or present to [facility] or ER if any life-threatening psychiatric crisis. Followup -- Return for time as already scheduled.  Next scheduled visit with me 11/14/2023.  Next scheduled in this office 11/14/2023.  Lamar Kendall, PhD [name], PhD LP Clinical Psychologist, [facility] [address] [place], [place] [address] [phone]

## 2023-11-13 ENCOUNTER — Encounter (INDEPENDENT_AMBULATORY_CARE_PROVIDER_SITE_OTHER): Payer: Self-pay | Admitting: Internal Medicine

## 2023-11-13 ENCOUNTER — Ambulatory Visit (INDEPENDENT_AMBULATORY_CARE_PROVIDER_SITE_OTHER): Admitting: Internal Medicine

## 2023-11-13 ENCOUNTER — Ambulatory Visit (INDEPENDENT_AMBULATORY_CARE_PROVIDER_SITE_OTHER): Payer: Self-pay | Admitting: Internal Medicine

## 2023-11-13 VITALS — BP 114/82 | HR 81 | Temp 98.3°F | Ht 70.0 in | Wt 224.0 lb

## 2023-11-13 DIAGNOSIS — R948 Abnormal results of function studies of other organs and systems: Secondary | ICD-10-CM

## 2023-11-13 DIAGNOSIS — Z6833 Body mass index (BMI) 33.0-33.9, adult: Secondary | ICD-10-CM

## 2023-11-13 DIAGNOSIS — E66811 Obesity, class 1: Secondary | ICD-10-CM

## 2023-11-13 DIAGNOSIS — E6609 Other obesity due to excess calories: Secondary | ICD-10-CM

## 2023-11-13 DIAGNOSIS — E88819 Insulin resistance, unspecified: Secondary | ICD-10-CM | POA: Diagnosis not present

## 2023-11-13 DIAGNOSIS — R638 Other symptoms and signs concerning food and fluid intake: Secondary | ICD-10-CM | POA: Diagnosis not present

## 2023-11-13 MED ORDER — TOPIRAMATE 25 MG PO TABS: 25.0000 mg | ORAL_TABLET | Freq: Every evening | ORAL | 0 refills | Status: DC

## 2023-11-13 NOTE — Progress Notes (Deleted)
 Office: [phone]  /  Fax: [phone]  Weight Summary and Body Composition Analysis (BIA)  Vitals Temp: 98.3 F (36.8 C) BP: 114/82 Pulse Rate: 81 SpO2: 99 %   Anthropometric Measurements Height: 5' 10 (1.778 m) Weight: 224 lb (101.6 kg) BMI (Calculated): 32.14 Weight at Last Visit: 220 lb Weight Lost Since Last Visit: 0 lb Weight Gained Since Last Visit: 4 lb Starting Weight: 221 lb Total Weight Loss (lbs): 0 lb (0 kg) Peak Weight: 240 lb   Body Composition  Body Fat %: 29.1 % Fat Mass (lbs): 65.4 lbs Muscle Mass (lbs): 151.2 lbs Total Body Water (lbs): 109.6 lbs Visceral Fat Rating : 10    RMR: 1930  Today's Visit #: 3  Starting Date: 10/02/23   Subjective   Chief Complaint: Obesity  Interval History Discussed the use of AI scribe software for clinical note transcription with the patient, who gave verbal consent to proceed.  History of Present Illness Louis [name] is a 28 year old male who presents for medical weight management.  Louis [name] is new to our program and is having a difficult time with implementation of plan, adoption of healthy lifestyle changes and problems with hunger control.  Louis [name] had been prescribed a 1500-calorie nutrition plan but adherence has been low.  Louis [name] has gained four pounds, attributing this to difficulties following his meal plan and increased cravings. Stress from his last semester of college has led to consuming convenient and unhealthy foods, including leftover Halloween treats like Cheez-Its.  Louis [name] is interested in pharmacotherapy for weight management.  His father has had a lot of success with GLP-1.  His weight impacts his self-image and daily life, affecting clothing choices and physical activities like skateboarding. Louis [name] feels physically behind his peers and mentions his father successfully lost weight using a GLP-1 medication.  Louis [name] experiences stomach pain and constipation, particularly in the mornings after nights of binge  eating, which has occasionally caused him to be late for appointments, including the day of this visit. Louis [name] sometimes wakes up with stomach pain and has been constipated, associating this with nights of binge eating. Louis [name] reports that Louis [name] can eat excessively at night without feeling full, unlike during the day when Louis [name] eventually feels satiated.  A recent stressor involving his [employer] account suspension has increased his anxiety and contributed to poor eating habits. Stress and anxiety have led to gorging at night.     Challenges affecting patient progress: strong hunger signals and/or impaired satiety / inhibitory control, having difficulty with meal prep and planning, having difficulty focusing on healthy eating, difficulty implementing reduced calorie nutrition plan, exposure to enticing environments and/or relationships, low volume of physical activity at present , moderate to high levels of stress, and altered sleep cycle.    Pharmacotherapy for weight management: Louis [name] is currently taking no anti-obesity medication.   Assessment and Plan   Treatment Plan For Obesity:  Recommended Dietary Goals  Louis [name] is currently in the action stage of change. As such, his goal is to continue weight management plan. Louis [name] has agreed to: portion control, balanced plate and making smarter food choices, such as increasing vegetables, protein intake and reducing simple carbohydrates and processed foods , continue current plan, and continue to work on implementation of reduced calorie nutrition plan (RCNP)  Behavioral Health and Counseling  We discussed the following behavioral modification strategies today: increasing lean protein intake to established goals, decreasing simple carbohydrates , increasing vegetables, increasing fiber rich foods, avoiding skipping meals, increasing water intake ,  work on meal planning and preparation, keeping healthy foods at home, work on managing stress, creating time for self-care and  relaxation, avoiding temptations and identifying enticing environmental cues, planning for success, and better snacking choices.  Additional education and resources provided today: Handout on traveling and holiday eating strategies  Recommended Physical Activity Goals  Louis [name] has been advised to work up to 150 minutes of moderate intensity aerobic activity a week and strengthening exercises 2-3 times per week for cardiovascular health, weight loss maintenance and preservation of muscle mass.  Louis [name] has agreed to :  Think about enjoyable ways to increase daily physical activity and overcoming barriers to exercise and Increase physical activity in their day and reduce sedentary time (increase NEAT).  Medical Interventions and Pharmacotherapy  We discussed various medication options to help Louis [name] with his weight loss efforts and we both agreed to : Start anti-obesity medication.  In addition to reduced calorie nutrition plan (RCNP), behavioral strategies and physical activity, Louis [name] would benefit from pharmacotherapy to assist with hunger signals, satiety and cravings. This will reduce obesity-related health risks by inducing weight loss, and help reduce food consumption and adherence to [facility]) . It may also improve QOL by improving self-confidence and reduce the  setbacks associated with metabolic adaptations.  After discussion of treatment options, mechanisms of action, benefits, side effects, contraindications and shared decision making Louis [name] is agreeable to starting topiramate 25 mg in the evening. Patient also made aware that medication is indicated for long-term management of obesity and the risk of weight regain following discontinuation of treatment and hence the importance of adhering to medical weight loss plan.   Initiated topiramate for off-label use in weight management. Explained medication is FDA-approved for seizures/migraines but not weight loss alone. Reviewed evidence of efficacy in  appetite suppression and use in combination weight loss therapy (e.g., Qsymia).  Risks reviewed: cognitive slowing, fatigue, paresthesia, mood effects, teratogenicity (need for reliable means of birth control in women of childbearing age). Alternatives discussed. Patient informed of off-label nature and consents to treatment. Will titrate dose gradually and monitor monthly.   Associated Conditions Impacted by Obesity Treatment  Assessment & Plan Abnormal craving  Abnormal metabolism  Class 1 obesity due to excess calories with serious comorbidity and body mass index (BMI) of 33.0 to 33.9 in adult Obesity with nighttime overeating and food cravings Obesity with nighttime overeating and food cravings, likely influenced by emotional hunger and circadian rhythm dysregulation. Louis [name] experiences difficulty maintaining meal plans and managing cravings, exacerbated by stress and lifestyle factors. Louis [name] is interested in pharmacotherapy, specifically metformin and GLP-1 medications, but is concerned about cost and long-term commitment. Topiramate is considered as an alternative due to its potential benefits in reducing binge eating and anxiolytic effect.  Louis [name] is hesitant about GLP-1 medications due to cost and long-term use concerns. - Prescribed topiramate 25 mg daily in the evening. - Advised against alcohol consumption while on topiramate. - Discussed potential benefits of GLP-1 medications and cost considerations. - Encouraged lifestyle modifications including improved sleep hygiene and dietary changes. 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 Optimal level < 1.9.   This is complex condition associated with genetics, ectopic fat and lifestyle factors. Insulin  resistance may result in increased fat storage, inhibition of the breakdown of fat, cause fluctuations in blood sugar leading to energy crashes and increased cravings for sugary or high carb foods and cause metabolic  slowdown making it difficult to lose weight.  This may result in  additional weight gain and lead to pre-diabetes and diabetes if untreated. In addition, hyperinsulinemia increases cardiovascular risk, chronic inflammatory response and may increase the risk of obesity related malignancies.  Lab Results  Component Value Date   HGBA1C 5.5 10/02/2023   Lab Results  Component Value Date   INSULIN  12.8 10/02/2023   Lab Results  Component Value Date   GLUCOSE 86 10/02/2023    We reviewed treatment options which include losing 7 to 10% of body weight, increasing volume of physical activity and maintaining a diet low in saturated fats and with a low glycemic load.  Patient has also been educated on the carb insulin  model of obesity.  Louis [name] may also be a candidate for pharmacoprophylaxis with metformin and / or GLP1 medication.  GLP-1 treatment is cost prohibitive we will consider starting metformin after trial of topiramate          Objective   Physical Exam:  Blood pressure 114/82, pulse 81, temperature 98.3 F (36.8 C), height 5' 10 (1.778 m), weight 224 lb (101.6 kg), SpO2 99%. Body mass index is 32.14 kg/m.  General: Louis [name] is overweight, cooperative, alert, well developed, and in no acute distress. PSYCH: Has normal mood, affect and thought process.   HEENT: EOMI, sclerae are anicteric. Lungs: Normal breathing effort, no conversational dyspnea. Extremities: No edema.  Neurologic: No gross sensory or motor deficits. No tremors or fasciculations noted.    Diagnostic Data Reviewed:  BMET    Component Value Date/Time   NA 141 10/02/2023 0910   K 4.4 10/02/2023 0910   CL 105 10/02/2023 0910   CO2 22 10/02/2023 0910   GLUCOSE 86 10/02/2023 0910   BUN 14 10/02/2023 0910   CREATININE 0.91 10/02/2023 0910   CALCIUM 9.7 10/02/2023 0910   Lab Results  Component Value Date   HGBA1C 5.5 10/02/2023   Lab Results  Component Value Date   INSULIN  12.8 10/02/2023   Lab Results   Component Value Date   TSH 3.360 10/02/2023   CBC    Component Value Date/Time   WBC 5.9 10/02/2023 0910   RBC 5.76 10/02/2023 0910   HGB 16.4 10/02/2023 0910   HCT 50.1 10/02/2023 0910   PLT 302 10/02/2023 0910   MCV 87 10/02/2023 0910   MCH 28.5 10/02/2023 0910   MCHC 32.7 10/02/2023 0910   RDW 12.9 10/02/2023 0910   Iron Studies No results found for: IRON, TIBC, FERRITIN, IRONPCTSAT Lipid Panel     Component Value Date/Time   CHOL 189 10/02/2023 0910   TRIG 133 10/02/2023 0910   HDL 41 10/02/2023 0910   LDLCALC 124 (H) 10/02/2023 0910   Hepatic Function Panel     Component Value Date/Time   PROT 6.9 10/02/2023 0910   ALBUMIN 4.4 10/02/2023 0910   AST 31 10/02/2023 0910   ALT 64 (H) 10/02/2023 0910   ALKPHOS 104 10/02/2023 0910   BILITOT 0.7 10/02/2023 0910      Component Value Date/Time   TSH 3.360 10/02/2023 0910   Nutritional Lab Results  Component Value Date   VD25OH 11.9 (L) 10/02/2023    Medications: Outpatient Encounter Medications as of 11/13/2023  Medication Sig   clonazePAM (KLONOPIN) 0.5 MG tablet Take 0.5 mg by mouth as needed for anxiety.   escitalopram  (LEXAPRO ) 20 MG tablet Take 1 tablet (20 mg total) by mouth daily.   methylphenidate  (CONCERTA ) 36 MG PO CR tablet Take 1 tablet (36 mg total) by mouth daily.   methylphenidate  (CONCERTA ) 36 MG PO  CR tablet Take 1 tablet (36 mg total) by mouth daily.   [START ON 11/20/2023] methylphenidate  (CONCERTA ) 54 MG PO CR tablet Take 1 tablet (54 mg total) by mouth every morning.   topiramate (TOPAMAX) 25 MG tablet Take 1 tablet (25 mg total) by mouth every evening.   Vitamin D , Ergocalciferol , (DRISDOL) 1.25 MG (50000 UNIT) CAPS capsule Take 1 capsule (50,000 Units total) by mouth every 7 (seven) days.   No facility-administered encounter medications on file as of 11/13/2023.     Follow-Up   Return in about 4 weeks (around 12/11/2023).SABRA Louis [name] was informed of the importance of frequent follow up  visits to maximize his success with intensive lifestyle modifications for his multiple health conditions.  Attestation Statement   Reviewed by clinician on day of visit: allergies, medications, problem list, medical history, surgical history, family history, social history, and previous encounter notes.     Lucas Parker, MD

## 2023-11-13 NOTE — Progress Notes (Deleted)
 Office: [phone]  /  Fax: [phone]  Weight Summary and Body Composition Analysis (BIA)  Vitals Temp: 98.3 F (36.8 C) BP: 114/82 Pulse Rate: 81 SpO2: 99 %   Anthropometric Measurements Height: 5' 10 (1.778 m) Weight: 224 lb (101.6 kg) BMI (Calculated): 32.14 Weight at Last Visit: 220 lb Weight Lost Since Last Visit: 0 lb Weight Gained Since Last Visit: 4 lb Starting Weight: 221 lb Total Weight Loss (lbs): 0 lb (0 kg) Peak Weight: 240 lb   Body Composition  Body Fat %: 29.1 % Fat Mass (lbs): 65.4 lbs Muscle Mass (lbs): 151.2 lbs Total Body Water (lbs): 109.6 lbs Visceral Fat Rating : 10    RMR: 1930  Today's Visit #: 3  Starting Date: 10/02/23   Subjective   Chief Complaint: Obesity  Interval History Discussed the use of AI scribe software for clinical note transcription with the patient, who gave verbal consent to proceed.  History of Present Illness Louis [name] is a 28 year old male who presents for medical weight management and is interested in pharmacotherapy.  He has gained four pounds, attributing this to difficulties following his meal plan and increased cravings. Stress from his last semester of college has led to consuming convenient and unhealthy foods, including leftover Halloween treats like Cheez-Its.  He is interested in pharmacotherapy for weight management, specifically mentioning metformin due to its lower cost and potential to help with addiction-type cravings. 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  His weight impacts his self-image and daily life, affecting clothing choices and physical activities like skateboarding. He feels physically behind his peers and mentions his father successfully lost weight using a GLP-1 medication.  He experiences stomach pain and constipation, particularly in the mornings after nights of binge eating, which has occasionally caused him to  be late for appointments, including the day of this visit. He sometimes wakes up with stomach pain and has been constipated, associating this with nights of binge eating. He reports that he can eat excessively at night without feeling full, unlike during the day when he eventually feels satiated.  A recent stressor involving his [employer] account suspension has increased his anxiety and contributed to poor eating habits. Stress and anxiety have led to gorging at night.     Challenges affecting patient progress: {EMOBESITYBARRIERS:28841::none}.    Pharmacotherapy for weight management: He is currently taking {EMPharmaco:28845}.   Assessment and Plan   Treatment Plan For Obesity:  Recommended Dietary Goals  Louis [name] is currently in the action stage of change. As such, his goal is to continue weight management plan. He has agreed to: {EMWTLOSSPLAN:29297::continue current plan}  Behavioral Health and Counseling  We discussed the following behavioral modification strategies today: {EMWMwtlossstrategies:28914::continue to work on maintaining a reduced calorie state, getting the recommended amount of protein, incorporating whole foods, making healthy choices, staying well hydrated and practicing mindfulness when eating.,increase protein intake, fibrous foods (25 grams per day for women, 30 grams for men) and water to improve satiety and decrease hunger signals. }.  Additional education and resources provided today: {EMadditionalresources:29169::None}  Recommended Physical Activity Goals  Louis [name] has been advised to work up to 150 minutes of moderate intensity aerobic activity a week and strengthening exercises 2-3 times per week for cardiovascular health, weight loss maintenance and preservation of muscle mass.  He has agreed to :  {EMEXERCISE:28847::Think about enjoyable ways to increase daily physical activity and overcoming barriers to exercise,Increase physical activity in their  day and reduce sedentary time (increase NEAT).,Increase volume of physical activity to a goal of 240 minutes a week,Combine aerobic and strengthening exercises for efficiency and improved cardiometabolic health.}  Medical Interventions and Pharmacotherapy  We discussed various medication options to help Louis [name] with his weight loss efforts and we both agreed to : {EMagreedrx:29170}  Associated Conditions Impacted by Obesity Treatment  Assessment & Plan     Assessment and Plan Assessment & Plan Obesity with nighttime overeating and food cravings Obesity with nighttime overeating and food cravings, likely influenced by emotional hunger and circadian rhythm dysregulation. He experiences difficulty maintaining meal plans and managing cravings, exacerbated by stress and lifestyle factors. He is interested in pharmacotherapy, specifically metformin and GLP-1 medications, but is concerned about cost and long-term commitment. Topiramate is considered as an alternative due to its potential benefits in reducing binge eating and providing a calming effect. He is hesitant about GLP-1 medications due to cost and long-term use concerns. - Prescribed topiramate 25 mg daily in the evening. - Advised against alcohol consumption while on topiramate. - Discussed potential benefits of GLP-1 medications and cost considerations. - Encouraged lifestyle modifications including improved sleep hygiene and dietary changes.  Constipation Intermittent constipation, often associated with nighttime overeating. Symptoms include stomach pain and difficulty with bowel movements, particularly after binge eating episodes. The relationship between dietary habits and gastrointestinal symptoms is discussed. - Encouraged dietary modifications to reduce simple carbohydrates and increase fiber intake.  Circadian rhythm dysregulation Contributing to nighttime overeating and food cravings. Disrupted sleep cycle may affect  hormonal regulation, leading to unopposed hunger signals and reduced inhibitory hormones. The importance of maintaining a regular sleep schedule is emphasized. - Discussed the role of sleep in hormonal regulation and overall health.  Recording duration: 23 minutes      Objective   Physical Exam:  Blood pressure 114/82, pulse 81, temperature 98.3 F (36.8 C), height 5' 10 (1.778 m), weight 224 lb (101.6 kg), SpO2 99%. Body mass index is 32.14 kg/m.  General: He is overweight, cooperative, alert, well developed, and in no acute distress. PSYCH: Has normal mood, affect and thought process.   HEENT: EOMI, sclerae are anicteric. Lungs: Normal breathing effort, no conversational dyspnea. Extremities: No edema.  Neurologic: No gross sensory or motor deficits. No tremors or fasciculations noted.    Diagnostic Data Reviewed:  BMET    Component Value Date/Time   NA 141 10/02/2023 0910   K 4.4 10/02/2023 0910   CL 105 10/02/2023 0910   CO2 22 10/02/2023 0910   GLUCOSE 86 10/02/2023 0910   BUN 14 10/02/2023 0910   CREATININE 0.91 10/02/2023 0910   CALCIUM 9.7 10/02/2023 0910   Lab Results  Component Value Date   HGBA1C 5.5 10/02/2023   Lab Results  Component Value Date   INSULIN  12.8 10/02/2023   Lab Results  Component Value Date   TSH 3.360 10/02/2023   CBC    Component Value Date/Time   WBC 5.9 10/02/2023 0910   RBC 5.76 10/02/2023 0910   HGB 16.4 10/02/2023 0910   HCT 50.1 10/02/2023 0910   PLT 302 10/02/2023 0910   MCV 87 10/02/2023 0910   MCH 28.5 10/02/2023 0910   MCHC 32.7 10/02/2023 0910   RDW 12.9 10/02/2023 0910   Iron Studies No results found for: IRON, TIBC, FERRITIN, IRONPCTSAT Lipid Panel     Component Value Date/Time   CHOL 189 10/02/2023 0910   TRIG 133 10/02/2023 0910   HDL 41 10/02/2023 0910   LDLCALC  124 (H) 10/02/2023 0910   Hepatic Function Panel     Component Value Date/Time   PROT 6.9 10/02/2023 0910   ALBUMIN 4.4  10/02/2023 0910   AST 31 10/02/2023 0910   ALT 64 (H) 10/02/2023 0910   ALKPHOS 104 10/02/2023 0910   BILITOT 0.7 10/02/2023 0910      Component Value Date/Time   TSH 3.360 10/02/2023 0910   Nutritional Lab Results  Component Value Date   VD25OH 11.9 (L) 10/02/2023    Medications: Outpatient Encounter Medications as of 11/13/2023  Medication Sig   clonazePAM (KLONOPIN) 0.5 MG tablet Take 0.5 mg by mouth as needed for anxiety.   escitalopram  (LEXAPRO ) 20 MG tablet Take 1 tablet (20 mg total) by mouth daily.   methylphenidate  (CONCERTA ) 36 MG PO CR tablet Take 1 tablet (36 mg total) by mouth daily.   methylphenidate  (CONCERTA ) 36 MG PO CR tablet Take 1 tablet (36 mg total) by mouth daily.   [START ON 11/20/2023] methylphenidate  (CONCERTA ) 54 MG PO CR tablet Take 1 tablet (54 mg total) by mouth every morning.   Vitamin D , Ergocalciferol , (DRISDOL) 1.25 MG (50000 UNIT) CAPS capsule Take 1 capsule (50,000 Units total) by mouth every 7 (seven) days.   No facility-administered encounter medications on file as of 11/13/2023.     Follow-Up   No follow-ups on file.SABRA He was informed of the importance of frequent follow up visits to maximize his success with intensive lifestyle modifications for his multiple health conditions.  Attestation Statement   Reviewed by clinician on day of visit: allergies, medications, problem list, medical history, surgical history, family history, social history, and previous encounter notes.     Lucas Parker, MD

## 2023-11-13 NOTE — Assessment & Plan Note (Deleted)
 Obesity with nighttime overeating and food cravings Obesity with nighttime overeating and food cravings, likely influenced by emotional hunger and circadian rhythm dysregulation. He experiences difficulty maintaining meal plans and managing cravings, exacerbated by stress and lifestyle factors. He is interested in pharmacotherapy, specifically metformin and GLP-1 medications, but is concerned about cost and long-term commitment. Topiramate is considered as an alternative due to its potential benefits in reducing binge eating and anxiolytic effect.  He is hesitant about GLP-1 medications due to cost and long-term use concerns. - Prescribed topiramate 25 mg daily in the evening. - Advised against alcohol consumption while on topiramate. - Discussed potential benefits of GLP-1 medications and cost considerations. - Encouraged lifestyle modifications including improved sleep hygiene and dietary changes.

## 2023-11-13 NOTE — Assessment & Plan Note (Deleted)
 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 Optimal level < 1.9.   This is complex condition associated with genetics, ectopic fat and lifestyle factors. Insulin  resistance may result in increased fat storage, inhibition of the breakdown of fat, cause fluctuations in blood sugar leading to energy crashes and increased cravings for sugary or high carb foods and cause metabolic slowdown making it difficult to lose weight.  This may result in additional weight gain and lead to pre-diabetes and diabetes if untreated. In addition, hyperinsulinemia increases cardiovascular risk, chronic inflammatory response and may increase the risk of obesity related malignancies.  Lab Results  Component Value Date   HGBA1C 5.5 10/02/2023   Lab Results  Component Value Date   INSULIN  12.8 10/02/2023   Lab Results  Component Value Date   GLUCOSE 86 10/02/2023    We reviewed treatment options which include losing 7 to 10% of body weight, increasing volume of physical activity and maintaining a diet low in saturated fats and with a low glycemic load.  Patient has also been educated on the carb insulin  model of obesity.  He may also be a candidate for pharmacoprophylaxis with metformin and / or GLP1 medication.  GLP-1 treatment is cost prohibitive we will consider starting metformin after trial of topiramate

## 2023-11-13 NOTE — Progress Notes (Signed)
 Expand All Collapse All    Office: [phone]  /  Fax: [phone]   Weight Summary and Body Composition Analysis (BIA)   Vitals Temp: 98.3 F (36.8 C) BP: 114/82 Pulse Rate: 81 SpO2: 99 %     Anthropometric Measurements Height: 5' 10 (1.778 m) Weight: 224 lb (101.6 kg) BMI (Calculated): 32.14 Weight at Last Visit: 220 lb Weight Lost Since Last Visit: 0 lb Weight Gained Since Last Visit: 4 lb Starting Weight: 221 lb Total Weight Loss (lbs): 0 lb (0 kg) Peak Weight: 240 lb     Body Composition  Body Fat %: 29.1 % Fat Mass (lbs): 65.4 lbs Muscle Mass (lbs): 151.2 lbs Total Body Water (lbs): 109.6 lbs Visceral Fat Rating : 10       RMR: 1930   Today's Visit #: 3   Starting Date: 10/02/23     Subjective    Chief Complaint: Obesity   Interval History Discussed the use of AI scribe software for clinical note transcription with the patient, who gave verbal consent to proceed.   History of Present Illness Louis [name] is a 28 year old male who presents for medical weight management.  He is new to our program and is having a difficult time with implementation of plan, adoption of healthy lifestyle changes and problems with hunger control.   He had been prescribed a 1500-calorie nutrition plan but adherence has been low.   He has gained four pounds, attributing this to difficulties following his meal plan and increased cravings. Stress from his last semester of college has led to consuming convenient and unhealthy foods, including leftover Halloween treats like Cheez-Its.   He is interested in pharmacotherapy for weight management.  His father has had a lot of success with GLP-1.   His weight impacts his self-image and daily life, affecting clothing choices and physical activities like skateboarding. He feels physically behind his peers and mentions his father successfully lost weight using a GLP-1 medication.   He experiences stomach pain and  constipation, particularly in the mornings after nights of binge eating, which has occasionally caused him to be late for appointments, including the day of this visit. He sometimes wakes up with stomach pain and has been constipated, associating this with nights of binge eating. He reports that he can eat excessively at night without feeling full, unlike during the day when he eventually feels satiated.   A recent stressor involving his [employer] account suspension has increased his anxiety and contributed to poor eating habits. Stress and anxiety have led to gorging at night.       Challenges affecting patient progress: strong hunger signals and/or impaired satiety / inhibitory control, having difficulty with meal prep and planning, having difficulty focusing on healthy eating, difficulty implementing reduced calorie nutrition plan, exposure to enticing environments and/or relationships, low volume of physical activity at present , moderate to high levels of stress, and altered sleep cycle.    Pharmacotherapy for weight management: He is currently taking no anti-obesity medication.    Assessment and Plan    Treatment Plan For Obesity:   Recommended Dietary Goals   Silvestre is currently in the action stage of change. As such, his goal is to continue weight management plan. He has agreed to: portion control, balanced plate and making smarter food choices, such as increasing vegetables, protein intake and reducing simple carbohydrates and processed foods , continue current plan, and continue to work on implementation of reduced calorie nutrition plan (RCNP)  Behavioral Health and Counseling   We discussed the following behavioral modification strategies today: increasing lean protein intake to established goals, decreasing simple carbohydrates , increasing vegetables, increasing fiber rich foods, avoiding skipping meals, increasing water intake , work on meal planning and preparation, keeping healthy  foods at home, work on managing stress, creating time for self-care and relaxation, avoiding temptations and identifying enticing environmental cues, planning for success, and better snacking choices.   Additional education and resources provided today: Handout on traveling and holiday eating strategies   Recommended Physical Activity Goals   Adonnis has been advised to work up to 150 minutes of moderate intensity aerobic activity a week and strengthening exercises 2-3 times per week for cardiovascular health, weight loss maintenance and preservation of muscle mass.   He has agreed to :  Think about enjoyable ways to increase daily physical activity and overcoming barriers to exercise and Increase physical activity in their day and reduce sedentary time (increase NEAT).   Medical Interventions and Pharmacotherapy   We discussed various medication options to help Ankith with his weight loss efforts and we both agreed to : Start anti-obesity medication.  In addition to reduced calorie nutrition plan (RCNP), behavioral strategies and physical activity, Donavon would benefit from pharmacotherapy to assist with hunger signals, satiety and cravings. This will reduce obesity-related health risks by inducing weight loss, and help reduce food consumption and adherence to [facility]) . It may also improve QOL by improving self-confidence and reduce the  setbacks associated with metabolic adaptations.   After discussion of treatment options, mechanisms of action, benefits, side effects, contraindications and shared decision making he is agreeable to starting topiramate 25 mg in the evening. Patient also made aware that medication is indicated for long-term management of obesity and the risk of weight regain following discontinuation of treatment and hence the importance of adhering to medical weight loss plan.    Initiated topiramate for off-label use in weight management. Explained medication is FDA-approved for  seizures/migraines but not weight loss alone. Reviewed evidence of efficacy in appetite suppression and use in combination weight loss therapy (e.g., Qsymia).   Risks reviewed: cognitive slowing, fatigue, paresthesia, mood effects, teratogenicity (need for reliable means of birth control in women of childbearing age). Alternatives discussed. Patient informed of off-label nature and consents to treatment. Will titrate dose gradually and monitor monthly.    Associated Conditions Impacted by Obesity Treatment   Assessment & Plan Abnormal craving   Abnormal metabolism   Class 1 obesity due to excess calories with serious comorbidity and body mass index (BMI) of 33.0 to 33.9 in adult Obesity with nighttime overeating and food cravings Obesity with nighttime overeating and food cravings, likely influenced by emotional hunger and circadian rhythm dysregulation. He experiences difficulty maintaining meal plans and managing cravings, exacerbated by stress and lifestyle factors. He is interested in pharmacotherapy, specifically metformin and GLP-1 medications, but is concerned about cost and long-term commitment. Topiramate is considered as an alternative due to its potential benefits in reducing binge eating and anxiolytic effect.  He is hesitant about GLP-1 medications due to cost and long-term use concerns. - Prescribed topiramate 25 mg daily in the evening. - Advised against alcohol consumption while on topiramate. - Discussed potential benefits of GLP-1 medications and cost considerations. - Encouraged lifestyle modifications including improved sleep hygiene and dietary changes. 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 Optimal level < 1.9.    This is complex condition associated with genetics, ectopic  fat and lifestyle factors. Insulin  resistance may result in increased fat storage, inhibition of the breakdown of fat, cause fluctuations in blood sugar leading to  energy crashes and increased cravings for sugary or high carb foods and cause metabolic slowdown making it difficult to lose weight.   This may result in additional weight gain and lead to pre-diabetes and diabetes if untreated. In addition, hyperinsulinemia increases cardiovascular risk, chronic inflammatory response and may increase the risk of obesity related malignancies.   Recent Labs       Lab Results  Component Value Date    HGBA1C 5.5 10/02/2023      Recent Labs       Lab Results  Component Value Date    INSULIN  12.8 10/02/2023      Recent Labs       Lab Results  Component Value Date    GLUCOSE 86 10/02/2023        We reviewed treatment options which include losing 7 to 10% of body weight, increasing volume of physical activity and maintaining a diet low in saturated fats and with a low glycemic load.  Patient has also been educated on the carb insulin  model of obesity.   He may also be a candidate for pharmacoprophylaxis with metformin and / or GLP1 medication.  GLP-1 treatment is cost prohibitive we will consider starting metformin after trial of topiramate               Objective    Physical Exam:   Blood pressure 114/82, pulse 81, temperature 98.3 F (36.8 C), height 5' 10 (1.778 m), weight 224 lb (101.6 kg), SpO2 99%. Body mass index is 32.14 kg/m.   General: He is overweight, cooperative, alert, well developed, and in no acute distress. PSYCH: Has normal mood, affect and thought process.   HEENT: EOMI, sclerae are anicteric. Lungs: Normal breathing effort, no conversational dyspnea. Extremities: No edema.  Neurologic: No gross sensory or motor deficits. No tremors or fasciculations noted.     Diagnostic Data Reviewed:   BMET Labs (Brief)          Component Value Date/Time    NA 141 10/02/2023 0910    K 4.4 10/02/2023 0910    CL 105 10/02/2023 0910    CO2 22 10/02/2023 0910    GLUCOSE 86 10/02/2023 0910    BUN 14 10/02/2023 0910     CREATININE 0.91 10/02/2023 0910    CALCIUM 9.7 10/02/2023 0910      Recent Labs       Lab Results  Component Value Date    HGBA1C 5.5 10/02/2023      Recent Labs       Lab Results  Component Value Date    INSULIN  12.8 10/02/2023      Recent Labs       Lab Results  Component Value Date    TSH 3.360 10/02/2023      CBC Labs (Brief)          Component Value Date/Time    WBC 5.9 10/02/2023 0910    RBC 5.76 10/02/2023 0910    HGB 16.4 10/02/2023 0910    HCT 50.1 10/02/2023 0910    PLT 302 10/02/2023 0910    MCV 87 10/02/2023 0910    MCH 28.5 10/02/2023 0910    MCHC 32.7 10/02/2023 0910    RDW 12.9 10/02/2023 0910      Iron Studies Labs (Brief)  No results found for: IRON, TIBC, FERRITIN, IRONPCTSAT  Lipid Panel  Labs (Brief)          Component Value Date/Time    CHOL 189 10/02/2023 0910    TRIG 133 10/02/2023 0910    HDL 41 10/02/2023 0910    LDLCALC 124 (H) 10/02/2023 0910      Hepatic Function Panel  Labs (Brief)          Component Value Date/Time    PROT 6.9 10/02/2023 0910    ALBUMIN 4.4 10/02/2023 0910    AST 31 10/02/2023 0910    ALT 64 (H) 10/02/2023 0910    ALKPHOS 104 10/02/2023 0910    BILITOT 0.7 10/02/2023 0910      Labs (Brief)          Component Value Date/Time    TSH 3.360 10/02/2023 0910      Nutritional Recent Labs       Lab Results  Component Value Date    VD25OH 11.9 (L) 10/02/2023        Medications:     Outpatient Encounter Medications as of 11/13/2023  Medication Sig   clonazePAM (KLONOPIN) 0.5 MG tablet Take 0.5 mg by mouth as needed for anxiety.   escitalopram  (LEXAPRO ) 20 MG tablet Take 1 tablet (20 mg total) by mouth daily.   methylphenidate  (CONCERTA ) 36 MG PO CR tablet Take 1 tablet (36 mg total) by mouth daily.   methylphenidate  (CONCERTA ) 36 MG PO CR tablet Take 1 tablet (36 mg total) by mouth daily.   [START ON 11/20/2023] methylphenidate  (CONCERTA ) 54 MG PO CR tablet Take 1 tablet (54 mg  total) by mouth every morning.   topiramate (TOPAMAX) 25 MG tablet Take 1 tablet (25 mg total) by mouth every evening.   Vitamin D , Ergocalciferol , (DRISDOL) 1.25 MG (50000 UNIT) CAPS capsule Take 1 capsule (50,000 Units total) by mouth every 7 (seven) days.      No facility-administered encounter medications on file as of 11/13/2023.        Follow-Up    Return in about 4 weeks (around 12/11/2023).SABRA He was informed of the importance of frequent follow up visits to maximize his success with intensive lifestyle modifications for his multiple health conditions.   Attestation Statement    Reviewed by clinician on day of visit: allergies, medications, problem list, medical history, surgical history, family history, social history, and previous encounter notes.       Lucas Parker, MD         Contains text generated by [employer]  Deleted by: Parker Lucas, MD at 11/13/2023 11:55 AM

## 2023-11-14 ENCOUNTER — Ambulatory Visit: Admitting: Psychiatry

## 2023-11-14 DIAGNOSIS — F3341 Major depressive disorder, recurrent, in partial remission: Secondary | ICD-10-CM | POA: Diagnosis not present

## 2023-11-14 DIAGNOSIS — F1910 Other psychoactive substance abuse, uncomplicated: Secondary | ICD-10-CM

## 2023-11-14 DIAGNOSIS — F509 Eating disorder, unspecified: Secondary | ICD-10-CM

## 2023-11-14 DIAGNOSIS — Z1589 Genetic susceptibility to other disease: Secondary | ICD-10-CM | POA: Diagnosis not present

## 2023-11-14 DIAGNOSIS — F4010 Social phobia, unspecified: Secondary | ICD-10-CM

## 2023-11-14 DIAGNOSIS — F900 Attention-deficit hyperactivity disorder, predominantly inattentive type: Secondary | ICD-10-CM

## 2023-11-14 NOTE — Progress Notes (Signed)
 Psychotherapy Progress Note [facility] [name], PhD LP  Patient ID: Louis [name] [facility])    MRN: [id number] Therapy format: Individual psychotherapy Date: 11/14/2023      Start: 11:12a     Stop: 12:00n     Time Spent: 48 min Location: In-person   Session narrative (presenting needs, interim history, self-report of stressors and symptoms, applications of prior therapy, status changes, and interventions made in session) Says he's come through something distressing this week -- on Tuesday found his [employer] account (coin of the realm for programmers, and the platform for his senior project and other work) was suspended, his material deleted, and facing what could be a fatally slow, dense customer service experience.  Thankfully worked out in 2 days, with best guess that a bot had scrubbed the account because he still has a forgotten account from [facility] and a rule against multiple accounts.  Not alarmed, not losing sleep, just an errand to run.  Affirmed working the problem rather than letting it depress him.  Continues to reassure himself about job prospects that he doesn't have to be a superstar, just good enough to beat the 8% unemployment rate in his field.  Anticipates biggest challenge for interviews will be nerves.  F has offered practice as the time gets closer.  Has had anxiety about taking a skills exam on Wed, delayed due to technical difficulties.  Knows he can psych himself out about jobs and requirements, take reassurance from things like the ETS test IT servicers not know what to do with Wed's problem.  Identifies fear of putting out crummy work and being the last to know, more than doing the best exactly.  Recognizes the same in his mom's past guidance and fretting.  Taking assurance also from an online gaming hero's word that it doesn't all have to be th rat race people make of it.  Overall, seeing success at catching anxiety, breaking down expectations, and  humanizing self-expectations.    Re medication, weight loss clinic offered topiramate, started yesterday.  Endorsed as helpful, most likely.  Reviewed med strategies in place and reinforced using carb control and food curfew.  Concern for 33yo brother Louis [name], who remains morbidly obese despite attempts at weight clinic, and despite a BP scare that temporarily motivated him.  Worries for himself how brother's nicotine habit also contributes, and his chronically sleep-deprived habit.  Discussed starting a caring conversation with him.  Therapeutic modalities: Cognitive Behavioral Therapy, Solution-Oriented/Positive Psychology, Ego-Supportive, and Assertiveness/Communication  Mental Status/Observations:  Appearance:   Casual     Behavior:  Appropriate  Motor:  Normal  Speech/Language:   Clear and Coherent  Affect:  Appropriate  Mood:  normal  Thought process:  normal  Thought content:    WNL  Sensory/Perceptual disturbances:    WNL  Orientation:  Fully oriented  Attention:  Good    Concentration:  Good  Memory:  WNL  Insight:    Good  Judgment:   Good  Impulse Control:  Good   Risk Assessment: Danger to Self: No Self-injurious Behavior: No Danger to Others: No Physical Aggression / Violence: No Duty to Warn: No Access to Firearms a concern: No  Assessment of progress:  progressing  Diagnosis:   ICD-10-CM   1. Major depressive disorder, recurrent, in partial remission  F33.41     2. 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 Attention deficit hyperactivity disorder (ADHD), predominantly inattentive type  F90.0     4.  MTHFR gene mutation  Z15.89     5. Compulsive eating patterns (r/o carb addiction)  F50.9     6. Polysubstance abuse (HCC) - in remission  F19.10      Plan:  Academic stress management -- as needed: Anti-procrastination -- self-remind of his purposes/goals, break down task requirements until he can willing take first step, and if unwilling to put in the work  right now, be explicit with self about it Practice identifying tasks to go ahead with and work ahead, self-affirm he'll appreciate it soon enough vs. anxiety from piling up work Biomedical scientist self-motivation -- not trying harder but just do ___ then see how it goes.  Don't make it about having motivation but about applying the motivation he has for small, doable things to break inertia. 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  Notice moments of motivational paralysis and treat them same as music mistakes -- play through, a note at a time, with full freedom to acknowledge and emote (e.g., scream internally, permission to say I'm really uncomfortable ...) while doing it.  Notice and rethink perfectionistic self-expectations and urges to rehearse self-blame for delayed progress in school.  Challenge impostor feelings by taking more risks to offer answers, opinions, input.  As for existential issues, acknowledge then see through to action toward something meaningful, and count it good. Sleep and circadian management -- Resist staying up too late, accept a reasonable endpoint to the day.  Keep phone out of reach at bed.  Recommend blue light curfew or use actual  orange lenses 1-2 hrs before bed.  Keep CPAP in good order and regular use, or reassess need. Metabolic self-regulation and nutrition -- Support in weight loss and overall healthy orientation.  Seek more movement/exercise.  Look into exercise snacks, especially if they will elevate HR or get winded for a bit.  May do double duty as desensitization to panic.  Continue driving down night snacking.  Continue practice of resisting night eating and doing something else at least 10 minutes, accepting going to bed a little hungry.  Continue shifting to more whole and prepared foods over convenience items.  Option to use MCT oil as the only calories after a food curfew (e.g., 10pm) if munchies are too strong to resist, and keep real food for real daylight.  Given MTHFR, recommend add B complex supplement or go on and try methylated B12 and folic acid.  MVI probably also worthwhile, given issues and some degraded nutrition, and how supplemented micronutrients may help stem night hunger.  May need a general physical to rule out other issues. Attention and time awareness v. distraction -- Try 1-minute listenings as mindfulness practice.  Option practice estimating 1 minute elapsed without using a timepiece.  If time blindness in shower or elsewhere, use music or a clock to ground  awareness of elapsed time. Communication with parents -- Continue agreement to let good night be good night, without late interruptions or veiled late attempts to motivate or teach.  Cont to assert as needed if feedback is unnecessarily guilting, catastrophizing, dogpiling, or looping.  For pestering, lecturing, or scientific laboratory technician (Mom, mainly), options to forgive and take it with a grain of salt, or ask an agreement to be short and blunt -- e.g., I'd rather hear Will you PLEASE take better care of my son?! -- than go on about what I should be doing, or how it just takes will power, or I'm not trying hard enough, etc.  Option as needed to ask  M to reconsider whether she knows how it feels dealing with an issue she comments on, as F and B do, but willing to frame it as It is hard AND I am trying.  Option to notice and thank M for detectable efforts restraining tendencies, and option to ask her how she would tell if he is well enough aware and capable of managing risks or problems she sees on his behalf. Social/morale -- Continue exploring friend group, permission to realign and rightsize.  Ongoing option to challenge friends if they are harming themselves.  Option to re-explore dating.  Endorse interests not on screens and exploring communities of shared interest. Substances -- Resume smoking cessation, by way of nicotine gum or patch if necessary, but preferably by habitual delay tactics and behavioral substitution.  Recommend fully abstain from pot, but acceptable control with current use.  Keep alcohol moderate, bring up if need for further help is warranted. Health conditions -- assess possibility of fatty liver if indicated, reduce carb intake for multiple benefits, and treat de Quervain's as recommended, including antiinflammatory medication and diet, heat/ice as indicated, and any best practices for usage.  Do not presume helplessness or drastic treatments without fact-finding. Other recommendations/advice -- As may be noted above.  Continue to utilize previously learned skills ad lib. Medication compliance -- Maintain medication as prescribed and work faithfully with relevant prescriber(s) if any changes are desired or seem indicated. Crisis service -- Aware of call list and work-in appts.  Call the clinic on-call service, 988/hotline, 911, or present to [facility] or ER if any life-threatening psychiatric crisis. Followup -- Return for time as already scheduled.  Next scheduled visit with me 11/28/2023.  Next scheduled in this office 11/28/2023.  Lamar Kendall, PhD [name], PhD LP Clinical Psychologist, [facility]  Group [facility] [address] [place], [place] [address] [phone]

## 2023-11-28 ENCOUNTER — Ambulatory Visit: Admitting: Psychiatry

## 2023-11-28 DIAGNOSIS — G4721 Circadian rhythm sleep disorder, delayed sleep phase type: Secondary | ICD-10-CM | POA: Diagnosis not present

## 2023-11-28 DIAGNOSIS — F900 Attention-deficit hyperactivity disorder, predominantly inattentive type: Secondary | ICD-10-CM

## 2023-11-28 DIAGNOSIS — Z720 Tobacco use: Secondary | ICD-10-CM

## 2023-11-28 DIAGNOSIS — Z1589 Genetic susceptibility to other disease: Secondary | ICD-10-CM

## 2023-11-28 DIAGNOSIS — F4010 Social phobia, unspecified: Secondary | ICD-10-CM

## 2023-11-28 DIAGNOSIS — F3341 Major depressive disorder, recurrent, in partial remission: Secondary | ICD-10-CM

## 2023-11-28 DIAGNOSIS — F509 Eating disorder, unspecified: Secondary | ICD-10-CM

## 2023-11-28 NOTE — Progress Notes (Unsigned)
 Psychotherapy Progress Note [facility] [name], PhD LP  Patient ID: Louis [name] [facility])    MRN: [id number] Therapy format: Individual psychotherapy Date: 11/28/2023      Start: 11:16p     Stop: 12:03p     Time Spent: 47 min Location: In-person   Session narrative (presenting needs, interim history, self-report of stressors and symptoms, applications of prior therapy, status changes, and interventions made in session) Starting to feel crunch time approaching for final semester.  No focal complaints about schoolwork, more about job prospects, job finding, impostor syndrome.  Holding the line worrying, just getting through his work and letting it come up when ready about job finding.  Took the ETS exam recently, found his score is well above average, somewhat reassuring against impostor syndrome.  Seems to prove he's learned more than he imagines, and that he would be trainable for jobs that could be available.  Notices he procrastinates healthful changes, largely for having too many shoulds.   Discussed priorities -- figures sleep discipline would be it, since he accepts that he does tend to depress, but knows he has a strong tendency still to keep his options open for the evening to stay up and enjoy more, and a nagging sense of need for me time while still living with parents.  Encouraged in thinking of himself as living in 2 time zones, and tomorrow Cliff needing -- and appreciating -- tonight Nedim's help not overspending his energy and sleep.  Discussed prerequisites to take a stand on bedtime -- goalsetting 1 day at a time for bedtimes, consent to turn in rather than an internal (resisted) mandate, acknowledgment that what's done -- or played -- today is enough, recognition that sleep is not a little death but preparation for tomorrow, and letting himself have that is gift a gift to himself rather than obligation.  Looking forward to TG plans.  Gets to see  brother pretty often, who is still caught in unhealthy habits himself, which could be closer to shortening his life.    Has not quit nicotine yet.  Encouraged to trim wherever possible and get through temporary cravings without.  Continues on weight management.  Therapeutic modalities: Cognitive Behavioral Therapy, Solution-Oriented/Positive Psychology, and Ego-Supportive  Mental Status/Observations:  Appearance:   Casual     Behavior:  Appropriate  Motor:  Normal  Speech/Language:   Clear and Coherent  Affect:  Appropriate  Mood:  anxious and less  Thought process:  normal  Thought content:    WNL  Sensory/Perceptual disturbances:    WNL  Orientation:  Fully oriented  Attention:  Good    Concentration:  Good  Memory:  WNL  Insight:    Good  Judgment:   Good  Impulse Control:  Fair   Risk Assessment: Danger to Self: No Self-injurious Behavior: No Danger to Others: No Physical Aggression / Violence: No Duty to Warn: No Access to Firearms a concern: No  Assessment of progress:  progressing  Diagnosis:   ICD-10-CM   1. Major depressive disorder, recurrent, in partial remission  F33.41     2. 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 Compulsive eating patterns (r/o carb addiction)  F50.9     5. Attention deficit hyperactivity disorder (ADHD), predominantly inattentive type  F90.0     6. MTHFR gene mutation  Z15.89     7. Smoking, with interest in quitting  Z72.0      Plan:  Academic stress management -- as needed: Anti-procrastination -- self-remind of his purposes/goals, break down task requirements until he can willing take first step, and if unwilling to put in the work right now, be explicit with self about it Practice identifying tasks to go ahead with and work ahead, self-affirm he'll appreciate it soon enough vs. anxiety from piling up work Biomedical scientist self-motivation -- not trying  harder but just do ___ then see how it goes.  Don't make it about having motivation but about applying the motivation he has for small, doable things to break inertia. 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  Notice moments of motivational paralysis and treat them same as music mistakes -- play through, a note at a time, with full freedom to acknowledge and emote (e.g., scream internally, permission to say I'm really uncomfortable ...) while doing it.  Notice and rethink perfectionistic self-expectations and urges to rehearse self-blame for delayed progress in school.  Challenge impostor feelings by taking more risks to offer answers, opinions, input.  As for existential issues, acknowledge then see through to action toward something meaningful, and count it good. Sleep and circadian management -- Resist staying up too late, accept a reasonable endpoint to the day.  Keep phone out of reach at bed.  Recommend blue light curfew or use actual orange lenses 1-2 hrs before bed.  Keep CPAP in good order and regular use, or reassess need.  Explicit consent to end the day, and reframe as gift to himself tomorrow. Metabolic self-regulation and nutrition -- Support in weight loss and overall healthy  orientation.  Seek more movement/exercise.  Look into exercise snacks, especially if they will elevate HR or get winded for a bit.  May do double duty as desensitization to panic.  Continue driving down night snacking.  Continue practice of resisting night eating and doing something else at least 10 minutes, accepting going to bed a little hungry.  Continue shifting to more whole and prepared foods over convenience items.  Option to use MCT oil as the only calories after a food curfew (e.g., 10pm) if munchies are too strong to resist, and keep real food for real daylight.  Given MTHFR, recommend add B complex supplement or go on and try methylated B12 and folic acid.  MVI probably also worthwhile, given issues and some degraded nutrition, and how supplemented micronutrients may help stem night hunger.  May need a general physical to rule out other issues. Attention and time awareness v. distraction -- Try 1-minute listenings as mindfulness practice.  Option practice estimating 1 minute elapsed without using a timepiece.  If time blindness in shower or elsewhere, use music or a clock to ground awareness of elapsed time. Communication with parents -- Continue agreement to let good night be good night, without late interruptions  or veiled late attempts to motivate or teach.  Cont to assert as needed if feedback is unnecessarily guilting, catastrophizing, dogpiling, or looping.  For pestering, lecturing, or scientific laboratory technician (Mom, mainly), options to forgive and take it with a grain of salt, or ask an agreement to be short and blunt -- e.g., I'd rather hear Will you PLEASE take better care of my son?! -- than go on about what I should be doing, or how it just takes will power, or I'm not trying hard enough, etc.  Option as needed to ask M to reconsider whether she knows how it feels dealing with an issue she comments on, as F and B do, but willing to frame it as It is hard AND I am trying.  Option to notice and  thank M for detectable efforts restraining tendencies, and option to ask her how she would tell if he is well enough aware and capable of managing risks or problems she sees on his behalf. Social/morale -- Continue exploring friend group, permission to realign and rightsize.  Ongoing option to challenge friends if they are harming themselves.  Option to re-explore dating.  Endorse interests not on screens and exploring communities of shared interest. Substances -- Resume smoking cessation, by way of nicotine gum or patch if necessary, but preferably by habitual delay tactics and behavioral substitution.  Recommend fully abstain from pot, but acceptable control with current use.  Keep alcohol moderate, bring up if need for further help is warranted. Health conditions -- assess possibility of fatty liver if indicated, reduce carb intake for multiple benefits, and treat de Quervain's as recommended, including antiinflammatory medication and diet, heat/ice as indicated, and any best practices for usage.  Do not presume helplessness or drastic treatments without fact-finding. Other recommendations/advice -- As may be noted above.  Continue to utilize previously learned skills ad lib. Medication compliance -- Maintain medication as prescribed and work faithfully with relevant prescriber(s) if any changes are desired or seem indicated. Crisis service -- Aware of call list and work-in appts.  Call the clinic on-call service, 988/hotline, 911, or present to [facility] or ER if any life-threatening psychiatric crisis. Followup -- Return for time as already scheduled.  Next scheduled visit with me 12/12/2023.  Next scheduled in this office 12/12/2023.  Lamar Kendall, PhD [name], PhD LP Clinical Psychologist, [facility] [address], [address] [place], [place] [address] [phone]

## 2023-12-02 ENCOUNTER — Other Ambulatory Visit (INDEPENDENT_AMBULATORY_CARE_PROVIDER_SITE_OTHER): Payer: Self-pay | Admitting: Internal Medicine

## 2023-12-02 DIAGNOSIS — E559 Vitamin D deficiency, unspecified: Secondary | ICD-10-CM

## 2023-12-09 ENCOUNTER — Other Ambulatory Visit (INDEPENDENT_AMBULATORY_CARE_PROVIDER_SITE_OTHER): Payer: Self-pay | Admitting: Internal Medicine

## 2023-12-09 DIAGNOSIS — R638 Other symptoms and signs concerning food and fluid intake: Secondary | ICD-10-CM

## 2023-12-10 ENCOUNTER — Telehealth: Payer: Self-pay | Admitting: Adult Health

## 2023-12-10 NOTE — Telephone Encounter (Signed)
Pt has appt 12/11

## 2023-12-10 NOTE — Telephone Encounter (Signed)
 Pt called at 4:40p requesting refill of Methylphenidate  36mg .  Last script is for 54mg  instead of 36mg .  Send script to  [facility] [id number] - [place], [place] - [address] [place], [place] [address] Phone: [phone]  Fax: [phone]    No upcoming appt scheduled.

## 2023-12-11 ENCOUNTER — Other Ambulatory Visit: Payer: Self-pay

## 2023-12-11 ENCOUNTER — Encounter (INDEPENDENT_AMBULATORY_CARE_PROVIDER_SITE_OTHER): Payer: Self-pay | Admitting: Internal Medicine

## 2023-12-11 ENCOUNTER — Ambulatory Visit (INDEPENDENT_AMBULATORY_CARE_PROVIDER_SITE_OTHER): Admitting: Internal Medicine

## 2023-12-11 VITALS — BP 115/83 | HR 81 | Temp 98.2°F | Ht 70.0 in | Wt 223.0 lb

## 2023-12-11 DIAGNOSIS — E66811 Obesity, class 1: Secondary | ICD-10-CM | POA: Diagnosis not present

## 2023-12-11 DIAGNOSIS — R948 Abnormal results of function studies of other organs and systems: Secondary | ICD-10-CM | POA: Diagnosis not present

## 2023-12-11 DIAGNOSIS — R638 Other symptoms and signs concerning food and fluid intake: Secondary | ICD-10-CM

## 2023-12-11 DIAGNOSIS — F900 Attention-deficit hyperactivity disorder, predominantly inattentive type: Secondary | ICD-10-CM

## 2023-12-11 DIAGNOSIS — Z6833 Body mass index (BMI) 33.0-33.9, adult: Secondary | ICD-10-CM | POA: Diagnosis not present

## 2023-12-11 DIAGNOSIS — E6609 Other obesity due to excess calories: Secondary | ICD-10-CM

## 2023-12-11 MED ORDER — METHYLPHENIDATE HCL ER (OSM) 36 MG PO TBCR: 36.0000 mg | EXTENDED_RELEASE_TABLET | Freq: Every day | ORAL | 0 refills | Status: DC

## 2023-12-11 MED ORDER — TOPIRAMATE 25 MG PO TABS: 25.0000 mg | ORAL_TABLET | Freq: Every evening | ORAL | 1 refills | Status: DC

## 2023-12-11 NOTE — Progress Notes (Deleted)
 The patient expresses a desire to lose weight but is currently not ready to initiate consistent behavior change.  They acknowledge the importance of weight management you have reported difficulty implementing or sustaining lifestyle modifications motivational emotion or situational barriers.  At this time they remain in the contemplative stage of change recognizing the need for improvement but struggling to take actionable steps

## 2023-12-11 NOTE — Telephone Encounter (Signed)
 Pended Rx for 36 mg with notation to cancel 54 mg.

## 2023-12-11 NOTE — Telephone Encounter (Signed)
 Mom called today on Louis [name]'s behalf stating the time sensitivity of this refill. He is taking exams, and she stated he relies on this medication to render the best out come on the exam.

## 2023-12-11 NOTE — Progress Notes (Unsigned)
 Office: [phone]  /  Fax: [phone]  Weight Summary and Body Composition Analysis (BIA)  Vitals Temp: 98.2 F (36.8 C) Pulse Rate: 81 SpO2: 98 %   Anthropometric Measurements Height: 5' 10 (1.778 m) Weight: 223 lb (101.2 kg) BMI (Calculated): 32 Weight at Last Visit: 224 lb Weight Lost Since Last Visit: 1 lb Weight Gained Since Last Visit: 0 lb Starting Weight: 221 lb Total Weight Loss (lbs): 0 lb (0 kg) Peak Weight: 240 lb   Body Composition  Body Fat %: 28.9 % Fat Mass (lbs): 64.6 lbs Muscle Mass (lbs): 150.8 lbs Total Body Water (lbs): 109.2 lbs Visceral Fat Rating : 10    RMR: 1930  Today's Visit #: 4  Starting Date: 10/02/23   Subjective   Chief Complaint: Obesity  Interval History Discussed the use of AI scribe software for clinical note transcription with the patient, who gave verbal consent to proceed.  History of Present Illness Louis [name] is a 28 year old male who presents for weight management and medication follow-up.  Since last office visit he has lost 1 pound.  He reports following a 1500-calorie nutrition targeted 40 to 50% of the time he is exercising 1 to 2 days a week 20 to 30 minutes.  Last office visit we started topiramate  to help with night eating.  He is on Concerta , Lexapro , and Klonopin as needed for mental health. The medication has helped reduce his hunger, making it easier to avoid eating at night. However, he experiences maladaptive coping mechanisms with food, particularly during high-stress periods related to his college finals and job search.  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 He experiences test-taking anxiety, which he has discussed with his therapist. 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  He has been working on  improving his eating habits by cooking more at home and reducing takeout consumption, although he admits to still eating too much takeout. He has made progress by opting for smaller portions, such as choosing smaller fries instead of large ones. He acknowledges that certain foods, like French fries, are difficult for him to resist due to their addictive nature.  Regarding sleep, he states that he is sleeping better overall but sometimes stays up late, which affects his next day. He attributes this to procrastination and stress from his current life circumstances.       Challenges affecting patient progress: Low volume of physical activity due to time and school he also has strong hunger signals and stays up late at night.    Pharmacotherapy for weight management: He is currently taking Topiramate  (off label use, single agent) with adequate clinical response  and without side effects..   Assessment and Plan   Treatment Plan For Obesity:  Recommended Dietary Goals  Louis [name] is currently in the action stage of change. As such, his goal is to continue weight management plan. He has agreed to: incorporate prepackaged healthy meals for convenience, incorporate 1-2 meal replacements a day for convenience , and continue current plan  Behavioral Health and Counseling  We discussed the following behavioral modification strategies today: increasing lean protein intake to established goals, decreasing simple carbohydrates , increasing water intake , work on meal planning and preparation, keeping healthy foods at home, decreasing eating out or consumption of processed foods, and making healthy choices when  eating convenient foods, work on managing stress, creating time for self-care and relaxation, and avoiding temptations and identifying enticing environmental cues.  Additional education and resources provided today: None  Recommended Physical Activity Goals  Louis [name] has been advised to work up to 150  minutes of moderate intensity aerobic activity a week and strengthening exercises 2-3 times per week for cardiovascular health, weight loss maintenance and preservation of muscle mass.  He has agreed to :  Think about enjoyable ways to increase daily physical activity and overcoming barriers to exercise, Increase physical activity in their day and reduce sedentary time (increase NEAT)., Increase volume of physical activity to a goal of 240 minutes a week, and Combine aerobic and strengthening exercises for efficiency and improved cardiometabolic health.  Medical Interventions and Pharmacotherapy  We discussed various medication options to help Louis [name] with his weight loss efforts and we both agreed to : Adequate clinical response to anti-obesity medication, continue current regimen  Associated Conditions Impacted by Obesity Treatment  Assessment & Plan Class 1 obesity due to excess calories with serious comorbidity and body mass index (BMI) of 33.0 to 33.9 in adult Abnormal craving Experiencing disordered eating behaviors, particularly during high-stress periods such as final exams and presentations. Reports some improvement in appetite control with current medication but continues to struggle with maladaptive coping mechanisms related to food. Stress from academic pressures and future career planning contributes to these behaviors. Discussed the impact of environmental factors and food choices on eating habits, emphasizing the importance of controlling the environment to support healthier eating habits. Discussed the potential benefits of GLP-1 agonists as a long-term treatment option, noting the reduced pricing of these medications. - Continue current medication regimen including Concerta , Lexapro , and Klonopin as needed. - Continue topiramate  for appetite control. - Provided a 60-day supply of topiramate .  Monitor for symptoms of depression - Discussed the use of the Yuka app to assess food  composition and make healthier choices. - Encouraged reducing takeout portions and choosing healthier snacks. - Discussed potential use of GLP-1 agonists as a long-term treatment option, noting the reduced pricing of these medications. Abnormal metabolism Patient has a slower than predicted metabolism. IC 1930 vs. calculated 2205. This may contribute to weight gain, chronic fatigue and difficulty losing weight.   We reviewed measures to improve metabolism including not skipping meals, progressive strengthening exercises, increasing protein intake at every meal and maintaining adequate hydration and sleep.   He continues to make lifestyle changes and increasing volume of physical activity.          Objective   Physical Exam:  Pulse 81, temperature 98.2 F (36.8 C), height 5' 10 (1.778 m), weight 223 lb (101.2 kg), SpO2 98%. Body mass index is 32 kg/m.  General: He is overweight, cooperative, alert, well developed, and in no acute distress. PSYCH: Has normal mood, affect and thought process.   HEENT: EOMI, sclerae are anicteric. Lungs: Normal breathing effort, no conversational dyspnea. Extremities: No edema.  Neurologic: No gross sensory or motor deficits. No tremors or fasciculations noted.    Diagnostic Data Reviewed:  BMET    Component Value Date/Time   NA 141 10/02/2023 0910   K 4.4 10/02/2023 0910   CL 105 10/02/2023 0910   CO2 22 10/02/2023 0910   GLUCOSE 86 10/02/2023 0910   BUN 14 10/02/2023 0910   CREATININE 0.91 10/02/2023 0910   CALCIUM 9.7 10/02/2023 0910   Lab Results  Component Value Date   HGBA1C 5.5 10/02/2023   Lab Results  Component Value Date   INSULIN  12.8 10/02/2023   Lab Results  Component Value Date   TSH 3.360 10/02/2023   CBC    Component Value Date/Time   WBC 5.9 10/02/2023 0910   RBC 5.76 10/02/2023 0910   HGB 16.4 10/02/2023 0910   HCT 50.1 10/02/2023 0910   PLT 302 10/02/2023 0910   MCV 87 10/02/2023 0910   MCH 28.5  10/02/2023 0910   MCHC 32.7 10/02/2023 0910   RDW 12.9 10/02/2023 0910   Iron Studies No results found for: IRON, TIBC, FERRITIN, IRONPCTSAT Lipid Panel     Component Value Date/Time   CHOL 189 10/02/2023 0910   TRIG 133 10/02/2023 0910   HDL 41 10/02/2023 0910   LDLCALC 124 (H) 10/02/2023 0910   Hepatic Function Panel     Component Value Date/Time   PROT 6.9 10/02/2023 0910   ALBUMIN 4.4 10/02/2023 0910   AST 31 10/02/2023 0910   ALT 64 (H) 10/02/2023 0910   ALKPHOS 104 10/02/2023 0910   BILITOT 0.7 10/02/2023 0910      Component Value Date/Time   TSH 3.360 10/02/2023 0910   Nutritional Lab Results  Component Value Date   VD25OH 11.9 (L) 10/02/2023    Medications: Outpatient Encounter Medications as of 12/11/2023  Medication Sig   clonazePAM (KLONOPIN) 0.5 MG tablet Take 0.5 mg by mouth as needed for anxiety.   escitalopram  (LEXAPRO ) 20 MG tablet Take 1 tablet (20 mg total) by mouth daily.   methylphenidate  (CONCERTA ) 36 MG PO CR tablet Take 1 tablet (36 mg total) by mouth daily.   Vitamin D , Ergocalciferol , (DRISDOL ) 1.25 MG (50000 UNIT) CAPS capsule TAKE 1 CAPSULE BY MOUTH ONCE WEEKLY   [DISCONTINUED] methylphenidate  (CONCERTA ) 36 MG PO CR tablet Take 1 tablet (36 mg total) by mouth daily.   [DISCONTINUED] methylphenidate  (CONCERTA ) 54 MG PO CR tablet Take 1 tablet (54 mg total) by mouth every morning.   [DISCONTINUED] topiramate  (TOPAMAX ) 25 MG tablet Take 1 tablet (25 mg total) by mouth every evening.   topiramate  (TOPAMAX ) 25 MG tablet Take 1 tablet (25 mg total) by mouth every evening.   No facility-administered encounter medications on file as of 12/11/2023.     Follow-Up   Return in about 4 weeks (around 01/08/2024) for For Weight Mangement with Dr. Francyne.SABRA He was informed of the importance of frequent follow up visits to maximize his success with intensive lifestyle modifications for his multiple health conditions.  Attestation Statement    Reviewed by clinician on day of visit: allergies, medications, problem list, medical history, surgical history, family history, social history, and previous encounter notes.     Lucas Francyne, MD

## 2023-12-12 ENCOUNTER — Ambulatory Visit: Admitting: Psychiatry

## 2023-12-12 NOTE — Assessment & Plan Note (Signed)
 Patient has a slower than predicted metabolism. IC 1930 vs. calculated 2205. This may contribute to weight gain, chronic fatigue and difficulty losing weight.   We reviewed measures to improve metabolism including not skipping meals, progressive strengthening exercises, increasing protein intake at every meal and maintaining adequate hydration and sleep.   He continues to make lifestyle changes and increasing volume of physical activity.

## 2023-12-12 NOTE — Assessment & Plan Note (Signed)
 Experiencing disordered eating behaviors, particularly during high-stress periods such as final exams and presentations. Reports some improvement in appetite control with current medication but continues to struggle with maladaptive coping mechanisms related to food. Stress from academic pressures and future career planning contributes to these behaviors. Discussed the impact of environmental factors and food choices on eating habits, emphasizing the importance of controlling the environment to support healthier eating habits. Discussed the potential benefits of GLP-1 agonists as a long-term treatment option, noting the reduced pricing of these medications. - Continue current medication regimen including Concerta , Lexapro , and Klonopin as needed. - Continue topiramate  for appetite control. - Provided a 60-day supply of topiramate .  Monitor for symptoms of depression - Discussed the use of the Yuka app to assess food composition and make healthier choices. - Encouraged reducing takeout portions and choosing healthier snacks. - Discussed potential use of GLP-1 agonists as a long-term treatment option, noting the reduced pricing of these medications.

## 2023-12-18 ENCOUNTER — Encounter: Payer: Self-pay | Admitting: Adult Health

## 2023-12-18 ENCOUNTER — Telehealth: Admitting: Adult Health

## 2023-12-18 DIAGNOSIS — F900 Attention-deficit hyperactivity disorder, predominantly inattentive type: Secondary | ICD-10-CM | POA: Diagnosis not present

## 2023-12-18 DIAGNOSIS — F331 Major depressive disorder, recurrent, moderate: Secondary | ICD-10-CM | POA: Diagnosis not present

## 2023-12-18 DIAGNOSIS — F411 Generalized anxiety disorder: Secondary | ICD-10-CM

## 2023-12-18 MED ORDER — METHYLPHENIDATE HCL ER (OSM) 36 MG PO TBCR: 36.0000 mg | EXTENDED_RELEASE_TABLET | Freq: Every day | ORAL | 0 refills | Status: AC

## 2023-12-18 MED ORDER — ESCITALOPRAM OXALATE 20 MG PO TABS: 20.0000 mg | ORAL_TABLET | Freq: Every day | ORAL | 2 refills | Status: AC

## 2023-12-18 NOTE — Progress Notes (Signed)
 Louis [name] [id number] Dec 01, 1995 28 y.o.  Virtual Visit via Video Note  I connected with pt @ on 12/18/2023 at 10:00 AM EST by a video enabled telemedicine application and verified that I am speaking with the correct person using two identifiers.   I discussed the limitations of evaluation and management by telemedicine and the availability of in person appointments. The patient expressed understanding and agreed to proceed.  I discussed the assessment and treatment plan with the patient. The patient was provided an opportunity to ask questions and all were answered. The patient agreed with the plan and demonstrated an understanding of the instructions.   The patient was advised to call back or seek an in-person evaluation if the symptoms worsen or if the condition fails to improve as anticipated.  I provided 20 minutes of non-face-to-face time during this encounter.  The patient was located at home.  The provider was located at [facility].   Louis LOISE Sayers, NP   Subjective:   Patient ID:  Louis [name] is a 28 y.o. (DOB [birth year]) male.  Chief Complaint: No chief complaint on file.   HPI Louis [name] presents for follow-up of MDD, ADHD, GAD.  Describes mood today as ok. Pleasant. Denies tearfulness. Mood symptoms - reports some depression and anxiety, but it's manageable. Reports stable interest and motivation. Denies irritability. Denies recent panic attacks - periods of higher anxiety. Reports some worry, rumination, and over thinking - a bit. But not bad. Reports mood is stable. Stating I feel like I'm doing ok. Feels like medications are helpful. Taking medications as prescribed.  Energy levels stable. Active, has a regular exercise routine x 2 days a week. Enjoys some usual interests and activities. Single. Not dating. Lives with parents and cat. Spending time with family and friends. Appetite adequate. Weight loss - 222 from 230 pounds. Sleeps  well most nights. Averages 6 to 8 hours. Reports focus and concentration stable. Completing tasks. Managing aspects of household. Graduating from [employer] this weekend. 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  Denies SI or HI.  Denies AH or VH. Denies self harm. Substance use - vapes nicotine. Denies THC. Reports minimal ETOH use.   Review of Systems:  Review of Systems  Musculoskeletal:  Negative for gait problem.  Neurological:  Negative for tremors.  Psychiatric/Behavioral:         Please refer to HPI    Medications: I have reviewed the patient's current medications.  Current Outpatient Medications  Medication Sig Dispense Refill   [START ON 02/12/2024] methylphenidate  (CONCERTA ) 36 MG PO CR tablet Take 1 tablet (36 mg total) by mouth daily. 30 tablet 0   clonazePAM (KLONOPIN) 0.5 MG tablet Take 0.5 mg by mouth as needed for anxiety.     escitalopram  (LEXAPRO ) 20 MG tablet Take 1 tablet (20 mg total) by mouth daily. 30 tablet 2   methylphenidate  (CONCERTA ) 36 MG PO CR tablet Take 1 tablet (36 mg total) by mouth daily. 30 tablet 0   [START ON 01/15/2024] methylphenidate  (CONCERTA ) 36 MG PO CR tablet Take 1 tablet (36 mg total) by mouth daily. 30 tablet 0   topiramate  (TOPAMAX ) 25 MG tablet Take 1 tablet (25 mg total) by mouth every evening. 30 tablet 1   Vitamin D , Ergocalciferol , (DRISDOL ) 1.25 MG (50000 UNIT) CAPS capsule TAKE 1 CAPSULE BY MOUTH ONCE WEEKLY 4 capsule 0   No current facility-administered medications for this visit.    Medication Side Effects: None  Allergies:  Allergies[1]  Past Medical History:  Diagnosis Date   ADHD    Anxiety and depression    Asthma    Back pain    Drug use    Fatty liver    IBS (irritable bowel syndrome)    Joint pain    Shortness of breath    Sleep apnea     Family History  Problem Relation Age of Onset   Obesity Father    Anxiety disorder Father    Depression Father    Hyperlipidemia Father    Hypertension  Father    Sleep apnea Father     Social History   Socioeconomic History   Marital status: Single    Spouse name: Not on file   Number of children: Not on file   Years of education: Not on file   Highest education level: Not on file  Occupational History   Not on file  Tobacco Use   Smoking status: Never   Smokeless tobacco: Never  Vaping Use   Vaping status: Every Day  Substance and Sexual Activity   Alcohol use: Not on file   Drug use: Not on file   Sexual activity: Not on file  Other Topics Concern   Not on file  Social History Narrative   Not on file   Social Drivers of Health   Tobacco Use: Low Risk (12/18/2023)   Patient History    Smoking Tobacco Use: Never    Smokeless Tobacco Use: Never    Passive Exposure: Not on file  Financial Resource Strain: Not on file  Food Insecurity: Not on file  Transportation Needs: Not on file  Physical Activity: Not on file  Stress: Not on file  Social Connections: Not on file  Intimate Partner Violence: Not on file  Depression (PHQ2-9): High Risk (10/02/2023)   Depression (PHQ2-9)    PHQ-2 Score: 13  Alcohol Screen: Not on file  Housing: Not on file  Utilities: Not on file  Health Literacy: Not on file    Past Medical History, Surgical history, Social history, and Family history were reviewed and updated as appropriate.   Please see review of systems for further details on the patient's review from today.   Objective:   Physical Exam:  There were no vitals taken for this visit.  Physical Exam Constitutional:      General: He is not in acute distress. Musculoskeletal:        General: No deformity.  Neurological:     Mental Status: He is alert and oriented to person, place, and time.     Coordination: Coordination normal.  Psychiatric:        Attention and Perception: Attention and perception normal. He does not perceive auditory or visual hallucinations.        Mood and Affect: Mood normal. Mood is not anxious  or depressed. Affect is not labile, blunt, angry or inappropriate.        Speech: Speech normal.        Behavior: Behavior normal.        Thought Content: Thought content normal. Thought content is not paranoid or delusional. Thought content does not include homicidal or suicidal ideation. Thought content does not include homicidal or suicidal plan.        Cognition and Memory: Cognition and memory normal.        Judgment: Judgment normal.     Comments: Insight intact     Lab Review:     Component Value Date/Time  NA 141 10/02/2023 0910   K 4.4 10/02/2023 0910   CL 105 10/02/2023 0910   CO2 22 10/02/2023 0910   GLUCOSE 86 10/02/2023 0910   BUN 14 10/02/2023 0910   CREATININE 0.91 10/02/2023 0910   CALCIUM 9.7 10/02/2023 0910   PROT 6.9 10/02/2023 0910   ALBUMIN 4.4 10/02/2023 0910   AST 31 10/02/2023 0910   ALT 64 (H) 10/02/2023 0910   ALKPHOS 104 10/02/2023 0910   BILITOT 0.7 10/02/2023 0910       Component Value Date/Time   WBC 5.9 10/02/2023 0910   RBC 5.76 10/02/2023 0910   HGB 16.4 10/02/2023 0910   HCT 50.1 10/02/2023 0910   PLT 302 10/02/2023 0910   MCV 87 10/02/2023 0910   MCH 28.5 10/02/2023 0910   MCHC 32.7 10/02/2023 0910   RDW 12.9 10/02/2023 0910   LYMPHSABS 1.9 10/02/2023 0910   EOSABS 0.1 10/02/2023 0910   BASOSABS 0.0 10/02/2023 0910    No results found for: POCLITH, LITHIUM   No results found for: PHENYTOIN, PHENOBARB, VALPROATE, CBMZ   .res Assessment: Plan:    Plan:  PDMP reviewed  Lexapro  20mg  daily  Concerta  36mg  daily  Using Clonazepam infrequently - none needed today.  20 minutes spent dedicated to the care of this patient on the date of this encounter to include pre-visit review of records, ordering of medication, post visit documentation, and face-to-face time with the patient discussing depression, anxiety and ADD. Discussed continuing current medication regimen.  Monitor BP between visits while taking stimulant  medication.   Working with Dr Marijean.  Genesight testing available.  RTC 3 months  Patient advised to contact office with any questions, adverse effects, or acute worsening in signs and symptoms.  Discussed potential benefits, risks, and side effects of stimulants with patient to include increased heart rate, palpitations, insomnia, increased anxiety, increased irritability, or decreased appetite.  Instructed patient to contact office if experiencing any significant tolerability issues.   Diagnoses and all orders for this visit:  Major depressive disorder, recurrent episode, moderate (HCC) -     escitalopram  (LEXAPRO ) 20 MG tablet; Take 1 tablet (20 mg total) by mouth daily.  Generalized anxiety disorder -     escitalopram  (LEXAPRO ) 20 MG tablet; Take 1 tablet (20 mg total) by mouth daily.  Attention deficit hyperactivity disorder (ADHD), predominantly inattentive type -     methylphenidate  (CONCERTA ) 36 MG PO CR tablet; Take 1 tablet (36 mg total) by mouth daily. -     methylphenidate  (CONCERTA ) 36 MG PO CR tablet; Take 1 tablet (36 mg total) by mouth daily. -     methylphenidate  (CONCERTA ) 36 MG PO CR tablet; Take 1 tablet (36 mg total) by mouth daily.     Please see After Visit Summary for patient specific instructions.  Future Appointments  Date Time Provider Department Center  12/26/2023 10:00 AM Marijean [place], PhD [facility] None  01/12/2024  2:20 PM Francyne Romano, MD [facility] None  01/23/2024  8:00 AM Marijean [place], PhD [facility] None  02/02/2024 11:00 AM Marijean [place], PhD [facility] None  03/17/2024 10:30 AM Rollyn Scialdone Nattalie, NP [facility] None    No orders of the defined types were placed in this encounter.     -------------------------------      [1] No Known Allergies

## 2023-12-26 ENCOUNTER — Ambulatory Visit (INDEPENDENT_AMBULATORY_CARE_PROVIDER_SITE_OTHER): Admitting: Psychiatry

## 2023-12-26 DIAGNOSIS — F331 Major depressive disorder, recurrent, moderate: Secondary | ICD-10-CM | POA: Diagnosis not present

## 2023-12-26 DIAGNOSIS — F4010 Social phobia, unspecified: Secondary | ICD-10-CM

## 2023-12-26 DIAGNOSIS — F900 Attention-deficit hyperactivity disorder, predominantly inattentive type: Secondary | ICD-10-CM | POA: Diagnosis not present

## 2023-12-26 NOTE — Progress Notes (Signed)
 Psychotherapy Progress Note [facility] [name], PhD LP  Patient ID: Louis [name] [facility])    MRN: [id number] Therapy format: Individual psychotherapy Date: 12/26/2023      Start: 10:12a     Stop: 11:00a     Time Spent: 48 min Location: In-person   Session narrative (presenting needs, interim history, self-report of stressors and symptoms, applications of prior therapy, status changes, and interventions made in session) Graduated a week ago, feeling some change, some validity, maybe, after going through a bit of self-castigation for graduating in the morning and being at work in fast food in the evening.  Allowed himself permission to act on a long-delayed computer upgrade.  Appetite up for finding a job in his field now.  Hearing more press from his father to make his way out of the house, now that he's equipped to do so.  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  Affirms he succeeded at keeping himself focused and engaged this semester, enough to make straight As.  Credits applying advice found online with a trusted influencer, to write down goals and consult them, but recently caught himself feeling very susceptible   Concerned for how pervasive prediction markets are becoming, sees shady gambling becoming the scourge of the current young generation, like vaping became for his.  Encouraged further in quitting nicotine himself and in job-seeking.  Is also pursuing again his hobby of restoring a vintage car.  Therapeutic modalities: Cognitive Behavioral Therapy, Solution-Oriented/Positive Psychology, and Ego-Supportive  Mental Status/Observations:  Appearance:   Casual     Behavior:  Appropriate  Motor:  Normal  Speech/Language:   Clear and Coherent  Affect:  Appropriate and more available  Mood:  normal and  less anxiety  Thought process:  normal  Thought content:    WNL  Sensory/Perceptual disturbances:    WNL  Orientation:  Fully oriented  Attention:  Good    Concentration:  Good  Memory:  WNL  Insight:    Good  Judgment:   Good  Impulse Control:  Good   Risk Assessment: Danger to Self: No Self-injurious Behavior: No Danger to Others: No Physical Aggression / Violence: No Duty to Warn: No Access to Firearms a concern: No  Assessment of progress:  progressing  Diagnosis:   ICD-10-CM   1. Major depressive disorder, recurrent episode, moderate (HCC)  F33.1     2. 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 Attention deficit hyperactivity disorder (ADHD), predominantly inattentive type  F90.0      Plan:  Task stress management -- as needed: Anti-procrastination -- self-remind of his purposes/goals, break down task requirements until he can willing take first step, and if unwilling to put in the work right now, be explicit with self about it Practice identifying tasks to go ahead with and work ahead, self-affirm he'll appreciate it soon enough vs. anxiety from piling up work Biomedical scientist self-motivation -- not trying harder but just do ___ then see how it goes.  Don't make it about having motivation but about applying the motivation he has for small, doable things to break inertia. 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  Notice moments of motivational paralysis and  treat them same as music mistakes -- play through, a note at a time, with full freedom to acknowledge and emote (e.g., scream internally, permission to say I'm really uncomfortable ...) while doing it.  Notice and rethink perfectionistic self-expectations and urges to rehearse self-blame for delays and perceived failures.  Challenge impostor feelings by taking more risks to offer answers, opinions, input.  As for existential issues, acknowledge then see through to action toward something meaningful, and count it good. Sleep and circadian management -- Resist staying up too late, accept a reasonable endpoint to the day.  Keep phone out of reach at bed.  Recommend blue light curfew or use actual orange lenses 1-2 hrs before bed.  Keep CPAP in good order and regular use, or reassess need.  Explicit consent to end the day, and reframe as gift to himself tomorrow. Metabolic self-regulation and nutrition -- Support in weight loss and overall healthy orientation.  Seek more movement/exercise.  Look into exercise snacks, especially if they will elevate HR or get winded for a bit.  May do double duty as desensitization to panic.  Continue driving down night snacking.  Continue practice of resisting night eating and doing something else at least 10 minutes, accepting going to bed a little hungry.  Continue shifting to more whole and prepared foods over convenience items.  Option to use MCT oil as the only calories after a food curfew (e.g., 10pm) if munchies are too strong to resist, and keep real food for real daylight.  Given MTHFR, recommend add B complex supplement or go on and try methylated B12 and folic acid.  MVI probably also worthwhile, given issues and some degraded nutrition, and how supplemented micronutrients may help stem night hunger.  May need a general physical to rule out other issues. Attention and time awareness v. distraction -- Try 1-minute listenings as mindfulness practice.  Option  practice estimating 1 minute elapsed without using a timepiece.  If time blindness in shower or elsewhere, use music or a clock to ground awareness of elapsed time. Communication with parents -- Continue agreement to let good night be good night, without late interruptions or veiled late attempts to motivate or teach.  Cont to assert as needed if feedback is unnecessarily guilting, catastrophizing, dogpiling, or looping.  For pestering, lecturing, or scientific laboratory technician (Mom, mainly), options to forgive and take it with a grain of salt, or ask an agreement to be short and blunt -- e.g., I'd rather hear Will you PLEASE take better care of my son?! -- than go on about what I should be doing, or how it just takes will power, or I'm not trying hard enough, etc.  Option as needed to ask M to reconsider whether she knows how it feels dealing with an issue she comments on, as F and B do, but willing to frame it as It is hard AND I am trying.  Option to notice and thank M for detectable efforts restraining tendencies, and option to ask her how she would tell if he is well enough aware and capable of managing risks or problems she sees on his behalf. Social/morale --  Continue exploring friend group, permission to realign and rightsize.  Ongoing option to challenge friends if they are harming themselves.  Option to re-explore dating.  Endorse interests not on screens and exploring communities of shared interest. Substances -- Resume smoking cessation, by way of nicotine gum or patch if necessary, but preferably by habitual delay tactics and behavioral substitution.  Recommend fully abstain from pot, but acceptable control with current use.  Keep alcohol moderate, bring up if need for further help is warranted. Health conditions -- assess possibility of fatty liver if indicated, reduce carb intake for multiple benefits, and treat de Quervain's as recommended, including antiinflammatory medication and diet, heat/ice as  indicated, and any best practices for usage.  Do not presume helplessness or drastic treatments without fact-finding. Other recommendations/advice -- As may be noted above.  Continue to utilize previously learned skills ad lib. Medication compliance -- Maintain medication as prescribed and work faithfully with relevant prescriber(s) if any changes are desired or seem indicated. Crisis service -- Aware of call list and work-in appts.  Call the clinic on-call service, 988/hotline, 911, or present to [facility] or ER if any life-threatening psychiatric crisis. Followup -- Return for time as already scheduled.  Next scheduled visit with me 01/23/2024.  Next scheduled in this office 01/23/2024.  Lamar Kendall, PhD [name], PhD LP Clinical Psychologist, [facility] [address] [place], [place] [address] [phone]

## 2024-01-04 ENCOUNTER — Other Ambulatory Visit (INDEPENDENT_AMBULATORY_CARE_PROVIDER_SITE_OTHER): Payer: Self-pay | Admitting: Internal Medicine

## 2024-01-04 DIAGNOSIS — E559 Vitamin D deficiency, unspecified: Secondary | ICD-10-CM

## 2024-01-12 ENCOUNTER — Ambulatory Visit (INDEPENDENT_AMBULATORY_CARE_PROVIDER_SITE_OTHER): Admitting: Internal Medicine

## 2024-01-12 ENCOUNTER — Encounter (INDEPENDENT_AMBULATORY_CARE_PROVIDER_SITE_OTHER): Payer: Self-pay | Admitting: Internal Medicine

## 2024-01-12 VITALS — BP 116/77 | HR 78 | Temp 97.7°F | Ht 70.0 in | Wt 231.0 lb

## 2024-01-12 DIAGNOSIS — E6609 Other obesity due to excess calories: Secondary | ICD-10-CM

## 2024-01-12 DIAGNOSIS — Z6833 Body mass index (BMI) 33.0-33.9, adult: Secondary | ICD-10-CM | POA: Diagnosis not present

## 2024-01-12 DIAGNOSIS — E88819 Insulin resistance, unspecified: Secondary | ICD-10-CM | POA: Diagnosis not present

## 2024-01-12 DIAGNOSIS — R948 Abnormal results of function studies of other organs and systems: Secondary | ICD-10-CM

## 2024-01-12 DIAGNOSIS — E66811 Obesity, class 1: Secondary | ICD-10-CM | POA: Diagnosis not present

## 2024-01-12 DIAGNOSIS — R638 Other symptoms and signs concerning food and fluid intake: Secondary | ICD-10-CM | POA: Diagnosis not present

## 2024-01-12 DIAGNOSIS — F509 Eating disorder, unspecified: Secondary | ICD-10-CM

## 2024-01-12 MED ORDER — TOPIRAMATE 25 MG PO TABS: 50.0000 mg | ORAL_TABLET | Freq: Every evening | ORAL | 1 refills | Status: AC

## 2024-01-12 NOTE — Progress Notes (Signed)
 "  Office: [phone]  /  Fax: [phone]  Weight Summary and Body Composition Analysis (BIA)  Vitals Temp: 97.7 F (36.5 C) BP: 116/77 Pulse Rate: 78 SpO2: 98 %   Anthropometric Measurements Height: 5' 10 (1.778 m) Weight: 231 lb (104.8 kg) BMI (Calculated): 33.15 Weight at Last Visit: 223 lb Weight Lost Since Last Visit: 0 lb Weight Gained Since Last Visit: 8 lb Starting Weight: 221 lb Total Weight Loss (lbs): 0 lb (0 kg) Peak Weight: 240 lb   Body Composition  Body Fat %: 29.2 % Fat Mass (lbs): 67.6 lbs Muscle Mass (lbs): 155.6 lbs Total Body Water (lbs): 112.6 lbs Visceral Fat Rating : 11    RMR: 1930  Today's Visit #: 5  Starting Date: 10/02/23   Subjective   Chief Complaint: Obesity  Interval History  Discussed the use of AI scribe software for clinical note transcription with the patient, who gave verbal consent to proceed.  History of Present Illness Louis [name] is a 29 year old male with mild sleep apnea and insulin  resistance who presents for medical weight management.  He has experienced a recent weight gain of eight pounds over the holidays. He is on a reduced calorie nutrition plan targeting 1500 calories per day with fair adherence and engages in cardio exercises twice a week for about 30 minutes each session. He wants to increase his physical activity and explore new hobbies like skateboarding to aid in weight management.  He has a history of mild sleep apnea for which he uses CPAP therapy. His sleep patterns are inconsistent, with bedtimes ranging from 10 PM to as late as 2:30 AM, often eating late at night before going to bed.  He faces challenges with controlling his appetite, particularly with fast foods, and describes a long-standing issue with impaired satiety. He often feels a strong urge to eat, especially fast food, and finds these foods pleasurable, which he attributes to a lack of inhibitory control over his hunger.  He has  been diagnosed with insulin  resistance and also has slow metabolism, which he believes contributes to his weight issues. His family has a history of weight struggles, particularly his father, who has also dealt with weight issues and has been using GLP-1 medications with success.  He is currently taking topiramate , which helps reduce his nighttime eating to some extent, but he still struggles with hunger and satiety issues.     Challenges affecting patient progress: strong hunger signals and/or impaired satiety / inhibitory control and low volume of physical activity at present .    Pharmacotherapy for weight management: He is currently taking Topiramate  (off label use, single agent) with adequate clinical response , without side effects., and but no significant weight loss.   Assessment and Plan   Treatment Plan For Obesity:  Recommended Dietary Goals  Louis [name] is currently in the action stage of change. As such, his goal is to continue weight management plan. He has agreed to: incorporate prepackaged healthy meals for convenience, incorporate 1-2 meal replacements a day for convenience , continue current plan, and continue to work on implementation of reduced calorie nutrition plan (RCNP)  Behavioral Health and Counseling  We discussed the following behavioral modification strategies today: increasing lean protein intake to established goals, decreasing simple carbohydrates , increasing vegetables, increasing lower glycemic fruits, increasing fiber Louis [name] foods, avoiding skipping meals, increasing water intake , work on meal planning and preparation, work on tracking and journaling calories using tracking application, and work on eating out less  and reducing ultra processed foods.  Additional education and resources provided today: None  Recommended Physical Activity Goals  Louis [name] has been advised to work up to 150 minutes of moderate intensity aerobic activity a week and strengthening  exercises 2-3 times per week for cardiovascular health, weight loss maintenance and preservation of muscle mass.  He has agreed to :  Think about enjoyable ways to increase daily physical activity and overcoming barriers to exercise, Increase physical activity in their day and reduce sedentary time (increase NEAT)., Increase volume of physical activity to a goal of 240 minutes a week, and Combine aerobic and strengthening exercises for efficiency and improved cardiometabolic health.  Medical Interventions and Pharmacotherapy  We discussed various medication options to help Louis [name] with his weight loss efforts and we both agreed to : Increase topiramate  50 mg once daily  Associated Conditions Impacted by Obesity Treatment  Assessment & Plan Insulin  resistance His HOMA-IR is 2.71 which is elevated and consider mild. Optimal level < 1.9.   This is complex condition associated with genetics, ectopic fat and lifestyle factors. Insulin  resistance may result in increased fat storage, inhibition of the breakdown of fat, cause fluctuations in blood sugar leading to energy crashes and increased cravings for sugary or high carb foods and cause metabolic slowdown making it difficult to lose weight.  This may result in additional weight gain and lead to pre-diabetes and diabetes if untreated. In addition, hyperinsulinemia increases cardiovascular risk, chronic inflammatory response and may increase the risk of obesity related malignancies.  Lab Results  Component Value Date   HGBA1C 5.5 10/02/2023   Lab Results  Component Value Date   INSULIN  12.8 10/02/2023   Lab Results  Component Value Date   GLUCOSE 86 10/02/2023   Patient will continue to work on reducing simple and added sugars in his diet also increasing volume of physical activity and reducing sedentary time.  Abnormal metabolism Class 1 obesity due to excess calories with serious comorbidity and body mass index (BMI) of 33.0 to 33.9 in  adult Abnormal craving Eating disorder, unspecified type Patient relatively new to our program has had difficulty with initiation of weight loss.  Strong hunger signals and lack of inhibitory control are contributory.  There is also a strong family history of obesity.Class 1 obesity with insulin  resistance and disordered eating, characterized by difficulty controlling appetite, especially for fast foods, and a tendency to eat late at night. Reports fair adherence to a reduced calorie nutrition plan targeting 1500 calories per day and engages in cardio two days a week for 30 minutes. Experiences challenges with satiety and satiation, possibly due to hormonal imbalances such as leptin resistance or insufficient GLP-1 release. Discussed potential benefits of GLP-1 agonists in reducing food noise and improving satiety, but insurance coverage is currently unavailable. Considered increasing topiramate  dosage to aid in appetite control, especially in the evening. Discussed financial implications of GLP-1 agonists and potential support from family. - Increased topiramate  to 50 mg in the evening. - Monitor for side effects such as numbness, tingling, and mood changes. - Encouraged adherence to a reduced calorie nutrition plan. - Advised on portion control and healthier meal options when eating out. - Discussed potential financial support from family for GLP-1 agonists.  Slow metabolic rate contributing to weight management challenges. Discussed the importance of building lean muscle mass and engaging in high-intensity interval training (HIIT) to improve metabolic rate. Encouraged exploring physical activities that align with personal interests, such as skateboarding, to increase physical activity levels. -  Encouraged participation in physical activities that align with personal interests, such as skateboarding. - Recommended exploring [facility] for access to exercise programs and facilities. - Advised on building  lean muscle mass and engaging in HIIT workouts.        Objective   Physical Exam:  Blood pressure 116/77, pulse 78, temperature 97.7 F (36.5 C), height 5' 10 (1.778 m), weight 231 lb (104.8 kg), SpO2 98%. Body mass index is 33.15 kg/m.  General: He is overweight, cooperative, alert, well developed, and in no acute distress. PSYCH: Has normal mood, affect and thought process.   HEENT: EOMI, sclerae are anicteric. Lungs: Normal breathing effort, no conversational dyspnea. Extremities: No edema.  Neurologic: No gross sensory or motor deficits. No tremors or fasciculations noted.    Diagnostic Data Reviewed:  BMET    Component Value Date/Time   NA 141 10/02/2023 0910   K 4.4 10/02/2023 0910   CL 105 10/02/2023 0910   CO2 22 10/02/2023 0910   GLUCOSE 86 10/02/2023 0910   BUN 14 10/02/2023 0910   CREATININE 0.91 10/02/2023 0910   CALCIUM 9.7 10/02/2023 0910   Lab Results  Component Value Date   HGBA1C 5.5 10/02/2023   Lab Results  Component Value Date   INSULIN  12.8 10/02/2023   Lab Results  Component Value Date   TSH 3.360 10/02/2023   CBC    Component Value Date/Time   WBC 5.9 10/02/2023 0910   RBC 5.76 10/02/2023 0910   HGB 16.4 10/02/2023 0910   HCT 50.1 10/02/2023 0910   PLT 302 10/02/2023 0910   MCV 87 10/02/2023 0910   MCH 28.5 10/02/2023 0910   MCHC 32.7 10/02/2023 0910   RDW 12.9 10/02/2023 0910   Iron Studies No results found for: IRON, TIBC, FERRITIN, IRONPCTSAT Lipid Panel     Component Value Date/Time   CHOL 189 10/02/2023 0910   TRIG 133 10/02/2023 0910   HDL 41 10/02/2023 0910   LDLCALC 124 (H) 10/02/2023 0910   Hepatic Function Panel     Component Value Date/Time   PROT 6.9 10/02/2023 0910   ALBUMIN 4.4 10/02/2023 0910   AST 31 10/02/2023 0910   ALT 64 (H) 10/02/2023 0910   ALKPHOS 104 10/02/2023 0910   BILITOT 0.7 10/02/2023 0910      Component Value Date/Time   TSH 3.360 10/02/2023 0910   Nutritional Lab  Results  Component Value Date   VD25OH 11.9 (L) 10/02/2023    Medications: Outpatient Encounter Medications as of 01/12/2024  Medication Sig   clonazePAM (KLONOPIN) 0.5 MG tablet Take 0.5 mg by mouth as needed for anxiety.   escitalopram  (LEXAPRO ) 20 MG tablet Take 1 tablet (20 mg total) by mouth daily.   methylphenidate  (CONCERTA ) 36 MG PO CR tablet Take 1 tablet (36 mg total) by mouth daily.   [START ON 01/15/2024] methylphenidate  (CONCERTA ) 36 MG PO CR tablet Take 1 tablet (36 mg total) by mouth daily.   [START ON 02/12/2024] methylphenidate  (CONCERTA ) 36 MG PO CR tablet Take 1 tablet (36 mg total) by mouth daily.   Vitamin D , Ergocalciferol , (DRISDOL ) 1.25 MG (50000 UNIT) CAPS capsule TAKE 1 CAPSULE BY MOUTH ONCE WEEKLY   [DISCONTINUED] topiramate  (TOPAMAX ) 25 MG tablet Take 1 tablet (25 mg total) by mouth every evening.   topiramate  (TOPAMAX ) 25 MG tablet Take 2 tablets (50 mg total) by mouth every evening.   No facility-administered encounter medications on file as of 01/12/2024.     Follow-Up   Return in about 8  weeks (around 03/08/2024).SABRA He was informed of the importance of frequent follow up visits to maximize his success with intensive lifestyle modifications for his multiple health conditions.  Attestation Statement   Reviewed by clinician on day of visit: allergies, medications, problem list, medical history, surgical history, family history, social history, and previous encounter notes.     Lucas Parker, MD  "

## 2024-01-12 NOTE — Assessment & Plan Note (Signed)
 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 Optimal level < 1.9.   This is complex condition associated with genetics, ectopic fat and lifestyle factors. Insulin  resistance may result in increased fat storage, inhibition of the breakdown of fat, cause fluctuations in blood sugar leading to energy crashes and increased cravings for sugary or high carb foods and cause metabolic slowdown making it difficult to lose weight.  This may result in additional weight gain and lead to pre-diabetes and diabetes if untreated. In addition, hyperinsulinemia increases cardiovascular risk, chronic inflammatory response and may increase the risk of obesity related malignancies.  Lab Results  Component Value Date   HGBA1C 5.5 10/02/2023   Lab Results  Component Value Date   INSULIN  12.8 10/02/2023   Lab Results  Component Value Date   GLUCOSE 86 10/02/2023   Patient will continue to work on reducing simple and added sugars in his diet also increasing volume of physical activity and reducing sedentary time.

## 2024-01-12 NOTE — Assessment & Plan Note (Signed)
 Patient relatively new to our program has had difficulty with initiation of weight loss.  Strong hunger signals and lack of inhibitory control are contributory.  There is also a strong family history of obesity.Class 1 obesity with insulin  resistance and disordered eating, characterized by difficulty controlling appetite, especially for fast foods, and a tendency to eat late at night. Reports fair adherence to a reduced calorie nutrition plan targeting 1500 calories per day and engages in cardio two days a week for 30 minutes. Experiences challenges with satiety and satiation, possibly due to hormonal imbalances such as leptin resistance or insufficient GLP-1 release. Discussed potential benefits of GLP-1 agonists in reducing food noise and improving satiety, but insurance coverage is currently unavailable. Considered increasing topiramate  dosage to aid in appetite control, especially in the evening. Discussed financial implications of GLP-1 agonists and potential support from family. - Increased topiramate  to 50 mg in the evening. - Monitor for side effects such as numbness, tingling, and mood changes. - Encouraged adherence to a reduced calorie nutrition plan. - Advised on portion control and healthier meal options when eating out. - Discussed potential financial support from family for GLP-1 agonists.

## 2024-01-23 ENCOUNTER — Ambulatory Visit: Admitting: Psychiatry

## 2024-01-23 DIAGNOSIS — F509 Eating disorder, unspecified: Secondary | ICD-10-CM | POA: Diagnosis not present

## 2024-01-23 DIAGNOSIS — F3341 Major depressive disorder, recurrent, in partial remission: Secondary | ICD-10-CM

## 2024-01-23 DIAGNOSIS — F4010 Social phobia, unspecified: Secondary | ICD-10-CM | POA: Diagnosis not present

## 2024-01-23 DIAGNOSIS — F900 Attention-deficit hyperactivity disorder, predominantly inattentive type: Secondary | ICD-10-CM | POA: Diagnosis not present

## 2024-01-23 NOTE — Progress Notes (Signed)
 Psychotherapy Progress Note [facility] [name], PhD LP  Patient ID: Louis [name] [facility])    MRN: [id number] Therapy format: Individual psychotherapy Date: 01/23/2024      Start: 8:18a     Stop: 9:04a     Time Spent: 46 min Location: In-person   Session narrative (presenting needs, interim history, self-report of stressors and symptoms, applications of prior therapy, status changes, and interventions made in session) Trying to acclimate to life without school.  Going to [place] today with friend for tourism and a concert.  Paused his stimulant after graduation, back on it now, seeing the need, with inconsistent motivation.  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  Continues weight management clinic.  Offered topiramate  but forgets it semi regularly, or might put it off to go ahead and eat.    Endorsed use of stimulant, circadian schedule, and motivational tactics.  Discussed and encouraged to vary his environment, even if he feels somewhat more exposed or at relative loss for privacy, and other self-motivation tactics.  Has a number of sticky notes for goals and things but they -- poetically -- fall off his computer monitor.  Agreed notes are only as good as his reading of them and willingness to take them seriously, and how computer monitor is a likely place to see them, but also probably deepest within the zone of attentional temptation.  Encouraged to adopt and post a message to self about doing something today on his quest for work, along with his 4 top motivations (get out of [employer], enable moving out of the house, have a regular change of scenery in his day, and see himself more capable and flexible) and identify a couple of first somethings to make concrete plans and target behaviors.  Therapeutic modalities: Cognitive Behavioral Therapy, Solution-Oriented/Positive Psychology, and Ego-Supportive  Mental Status/Observations:  Appearance:   Casual      Behavior:  Appropriate  Motor:  Normal  Speech/Language:   Clear and Coherent  Affect:  Appropriate  Mood:  dysthymic  Thought process:  normal  Thought content:    WNL  Sensory/Perceptual disturbances:    WNL  Orientation:  Fully oriented  Attention:  Good    Concentration:  Fair  Memory:  WNL  Insight:    Good  Judgment:   Good  Impulse Control:  Fair   Risk Assessment: Danger to Self: No Self-injurious Behavior: No Danger to Others: No Physical Aggression / Violence: No Duty to Warn: No Access to Firearms a concern: No  Assessment of progress:  progressing  Diagnosis:   ICD-10-CM   1. Social anxiety disorder  F40.10     2. 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 Attention deficit hyperactivity disorder (ADHD), predominantly inattentive type  F90.0     4. Compulsive eating patterns (r/o carb addiction)  F50.9      Plan:  Priority today to set up reminder of his motivations -- the because list and a couple of target behaviors Task stress management -- as needed: Anti-procrastination -- self-remind of his purposes/goals, break down task requirements until he can willing take first step, and if unwilling to put in the work right now, be explicit with self about it Practice identifying tasks to go ahead with and work ahead, self-affirm he'll appreciate it soon enough vs. anxiety from piling up work Biomedical scientist self-motivation -- not trying harder but just do ___ then see how it goes.  Don't  make it about having motivation but about applying the motivation he has for small, doable things to break inertia. 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  Notice moments of motivational paralysis and treat them same as music mistakes -- play through, a note at a time, with full freedom to acknowledge and emote (e.g., scream internally, permission to say I'm really uncomfortable ...) while doing it.  Notice and rethink perfectionistic self-expectations and urges to rehearse self-blame for delays and perceived failures.  Challenge impostor feelings by taking more risks to offer answers, opinions, input.  As for existential issues, acknowledge then see through to action toward something meaningful, and count it good. Sleep and circadian management -- Resist staying up too late, accept a reasonable endpoint to the day.  Keep phone out of reach at bed.  Recommend blue light curfew or use actual orange lenses 1-2 hrs before bed.  Keep CPAP in good order and regular use, or reassess need.  Explicit consent to end the day, and reframe as gift to himself tomorrow. Metabolic self-regulation and nutrition -- Support in weight loss and overall healthy orientation.  Seek more movement/exercise.  Look into exercise snacks, especially if they will elevate HR or get winded for a bit.  May do double duty as desensitization to panic.  Continue driving down night snacking.  Continue practice of resisting night eating and doing something else at least 10 minutes, accepting going to bed a little hungry.  Continue shifting to more whole and prepared foods over convenience items.  Option to use MCT oil as the only calories after a food curfew (e.g., 10pm) if munchies are too strong to resist, and keep real food for real daylight.  Given MTHFR, recommend add B complex supplement or go on and try methylated B12 and folic acid.  MVI  probably also worthwhile, given issues and some degraded nutrition, and how supplemented micronutrients may help stem night hunger.  May need a general physical to rule out other issues. Attention and time awareness v. distraction -- Try 1-minute listenings as mindfulness practice.  Option practice estimating 1 minute elapsed without using a timepiece.  If time blindness in shower or elsewhere, use music or a clock to ground awareness of elapsed time. Communication with parents -- Continue agreement to let good night be good night, without late interruptions or veiled late attempts to motivate or teach.  Cont to assert as needed if feedback is unnecessarily guilting, catastrophizing, dogpiling, or looping.  For pestering, lecturing, or scientific laboratory technician (Mom, mainly), options to forgive and take it with a grain of salt, or ask an agreement to be short and blunt -- e.g., I'd rather hear Will you PLEASE take better care of my son?! -- than go on about what I should be doing, or how it just takes will power, or I'm not trying hard enough, etc.  Option as needed to ask M to reconsider whether she knows how it feels dealing with an issue she comments on, as F and B do,  but willing to frame it as It is hard AND I am trying.  Option to notice and thank M for detectable efforts restraining tendencies, and option to ask her how she would tell if he is well enough aware and capable of managing risks or problems she sees on his behalf. Social/morale -- Continue exploring friend group, permission to realign and rightsize.  Ongoing option to challenge friends if they are harming themselves.  Option to re-explore dating.  Endorse interests not on screens and exploring communities of shared interest. Substances -- Resume smoking cessation, by way of nicotine gum or patch if necessary, but preferably by habitual delay tactics and behavioral substitution.  Recommend fully abstain from pot, but acceptable control with current  use.  Keep alcohol moderate, bring up if need for further help is warranted. Health conditions -- assess possibility of fatty liver if indicated, reduce carb intake for multiple benefits, and treat de Quervain's as recommended, including antiinflammatory medication and diet, heat/ice as indicated, and any best practices for usage.  Do not presume helplessness or drastic treatments without fact-finding. Other recommendations/advice -- As may be noted above.  Continue to utilize previously learned skills ad lib. Medication compliance -- Maintain medication as prescribed and work faithfully with relevant prescriber(s) if any changes are desired or seem indicated. Crisis service -- Aware of call list and work-in appts.  Call the clinic on-call service, 988/hotline, 911, or present to [facility] or ER if any life-threatening psychiatric crisis. Followup -- Return for time as already scheduled.  Next scheduled visit with me 02/02/2024.  Next scheduled in this office 02/02/2024.  Louis Kendall, PhD [name], PhD LP Clinical Psychologist, [facility] [address] [place], [place] [address] [phone]

## 2024-02-02 ENCOUNTER — Ambulatory Visit: Admitting: Psychiatry

## 2024-02-02 DIAGNOSIS — F900 Attention-deficit hyperactivity disorder, predominantly inattentive type: Secondary | ICD-10-CM

## 2024-02-02 DIAGNOSIS — F3341 Major depressive disorder, recurrent, in partial remission: Secondary | ICD-10-CM | POA: Diagnosis not present

## 2024-02-02 DIAGNOSIS — F411 Generalized anxiety disorder: Secondary | ICD-10-CM | POA: Diagnosis not present

## 2024-02-02 NOTE — Progress Notes (Unsigned)
 Psychotherapy Progress Note [facility] [name], PhD LP  Patient ID: Louis [name] [facility])    MRN: [id number] Therapy format: Individual psychotherapy Date: 02/02/2024      Start: 11:13a     Stop: 11:58a     Time Spent: 45 min Location: Telehealth visit -- I connected with this patient by an approved telecommunication method (video), with his informed consent, and verifying identity and patient privacy.  I was located at my home and patient at his home.  As needed, we discussed the limitations, risks, and security and privacy concerns associated with telehealth service, including the availability and conditions which currently govern in-person appointments and the possibility that 3rd-party payment may not be fully guaranteed and he may be responsible for charges.  After he indicated understanding, we proceeded with the session.  Also discussed treatment planning, as needed, including ongoing verbal agreement with the plan, the opportunity to ask and answer all questions, his demonstrated understanding of instructions, and his readiness to call the office should symptoms worsen or he feels he is in a crisis state and needs more immediate and tangible assistance.   Unable to initiate video on his end, agreed to proceed 3/4 video.  Session narrative (presenting needs, interim history, self-report of stressors and symptoms, applications of prior therapy, status changes, and interventions made in session) Enacted suggestion last time to log his good reasons and post them on one of his computer monitors.  Going to [employer] reminds him nearly every day.  Minding his brother's advice to let himself have his time, and pace, transitioning to working world, without avoiding.  Dad supportive about this time of year being less than ideal for hiring.    News of federal abuses is hard to stomach, as is the I don't get involved attitude of some friends.  Validated the inherent  loneliness of finding a friend's mind unresponsive.  Can be dismaying to find how many people seem to have not picked up concern for others and caring what happens to the world.  Demographically, he is positioned to notice how dehumanizing internet discourse and living can be, and how trolling and rage-baiting and controversy-amplifying algorithms have amped things up.  Some concern for his brother, possibly going more cynical -- evidence in more anti-religion thoughts, weight gain, and musing about leaving the country eventually.    Has a friend Stuart from [place] who has been living in [place], preparing to move to [place] , brings up the idea of radically relocating himself.  Not inclined to go so far, more likely [place].  Coworker Selena, a single mother, has encouraged him that he has the freedom and resources, go for it.  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  Validated his values and his willingness to take perspective instead of make assumptions based on some privileged upbringing.   Further encouragement identifying today's steps to take and acting on them before distracting or avoiding.  Therapeutic modalities: Cognitive Behavioral Therapy, Solution-Oriented/Positive Psychology, Ego-Supportive, and Humanistic/Existential  Mental Status/Observations:  Appearance:   Not assessed     Behavior:  Appropriate  Motor:  Not assessed  Speech/Language:   Clear and Coherent  Affect:  Not assessed  Mood:  concerned  Thought process:  normal  Thought content:    WNL  Sensory/Perceptual disturbances:    WNL  Orientation:  Fully oriented  Attention:  Good    Concentration:  Good  Memory:  WNL  Insight:    Good  Judgment:   Good  Impulse Control:  Variable   Risk Assessment: Danger to Self: No Self-injurious Behavior: No Danger to Others: No Physical Aggression / Violence: No Duty to Warn:  No Access to Firearms a concern: No  Assessment of progress:  progressing  Diagnosis:   ICD-10-CM   1. Major depressive disorder, recurrent, in partial remission  F33.41     2. Generalized anxiety disorder  F41.1     3. Attention deficit hyperactivity disorder (ADHD), predominantly inattentive type  F90.0      Plan:  Task stress management -- as needed: Anti-procrastination -- self-remind of his purposes/goals, break down task requirements until he can willing take first step, and if unwilling to put in the work right now, be explicit with self about it Practice identifying tasks to go ahead with and work ahead, self-affirm he'll appreciate it soon enough vs. anxiety from piling up work Biomedical scientist self-motivation -- not trying harder but just do ___ then see how it goes.  Don't make it about having motivation but about applying the motivation he has for small, doable things to break inertia. 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  Notice moments of motivational paralysis and treat them same as music mistakes -- play through, a note at a time, with full freedom to acknowledge and emote (e.g., scream internally, permission to say I'm really uncomfortable ...) while doing it.  Notice and rethink perfectionistic self-expectations and urges to rehearse self-blame for delays and perceived failures.  Challenge impostor feelings by taking more risks to offer answers,  opinions, input.  As for existential issues, acknowledge then see through to action toward something meaningful, and count it good. Sleep and circadian management -- Resist staying up too late, accept a reasonable endpoint to the day.  Keep phone out of reach at bed.  Recommend blue light curfew or use actual orange lenses 1-2 hrs before bed.  Keep CPAP in good order and regular use, or reassess need.  Explicit consent to end the day, and reframe as gift to himself tomorrow. Metabolic self-regulation and nutrition -- Support in weight loss and overall healthy orientation.  Seek more movement/exercise.  Look into exercise snacks, especially if they will elevate HR or get winded for a bit.  May do double duty as desensitization to panic.  Continue driving down night snacking.  Continue practice of resisting night eating and doing something else at least 10 minutes, accepting going to bed a little hungry.  Continue shifting to more whole and prepared foods over convenience items.  Option to use MCT oil as the only calories after a food curfew (e.g., 10pm) if munchies are too strong to resist, and keep real food for real daylight.  Given MTHFR, recommend add B complex supplement or go on and try methylated B12 and folic acid.  MVI probably also worthwhile, given issues and some degraded nutrition, and how supplemented micronutrients may help stem night hunger.  May need a general physical to rule out other issues. Attention and time awareness v. distraction -- Try 1-minute listenings as mindfulness practice.  Option practice estimating 1 minute elapsed without using a timepiece.  If time blindness in shower or elsewhere, use music or a clock to ground awareness of elapsed time. Communication with parents -- Continue agreement to let good night be good night, without late interruptions or veiled late attempts to motivate or teach.  Cont to assert as needed if feedback is unnecessarily guilting, catastrophizing,  dogpiling, or looping.  For pestering, lecturing, or scientific laboratory technician (Mom, mainly), options to forgive and take it with a grain of salt, or ask an agreement to be short and blunt -- e.g., I'd rather hear Will you PLEASE take better care of my son?! -- than go on about what I should be doing, or how it just takes will power, or I'm not trying hard enough, etc.  Option as needed to ask M to reconsider whether she knows how it feels dealing with an issue she comments on, as F and B do, but willing to frame it as It is hard AND I am trying.  Option to notice and thank M for detectable efforts restraining tendencies, and option to ask her how she would tell if he is well enough aware and capable of managing risks or problems she sees on his behalf. Social/morale -- Continue exploring friend group, permission to realign and rightsize.  Ongoing option to challenge friends if they are harming themselves.  Option to re-explore dating.  Endorse interests not on screens and exploring communities of shared interest. Substances -- Resume smoking cessation, by way of nicotine gum or patch if necessary, but preferably by habitual delay tactics and behavioral substitution.  Recommend fully abstain from pot, but acceptable control with current use.  Keep alcohol moderate, bring up if need for further help is warranted. Health conditions -- assess possibility of fatty liver if indicated, reduce carb intake for multiple benefits, and treat de Quervain's as recommended, including antiinflammatory medication and diet, heat/ice as indicated, and any best practices for usage.  Do not presume helplessness or drastic treatments without fact-finding. Other recommendations/advice -- As may be noted above.  Continue to utilize previously learned skills ad lib. Medication compliance -- Maintain medication as prescribed and work faithfully with relevant prescriber(s) if any changes are desired or seem indicated. Crisis service -- Aware  of call list and work-in appts.  Call the clinic on-call service, 988/hotline, 911, or present to [facility] or ER if any life-threatening psychiatric crisis. Followup -- Return for time as already scheduled.  Next scheduled visit with me 02/20/2024.  Next scheduled in this office 02/20/2024.  Lamar Kendall, PhD [name], PhD LP Clinical Psychologist, [facility] [address] [place], [place] [address] [phone]

## 2024-02-05 ENCOUNTER — Telehealth (INDEPENDENT_AMBULATORY_CARE_PROVIDER_SITE_OTHER): Payer: Self-pay

## 2024-02-05 ENCOUNTER — Other Ambulatory Visit (INDEPENDENT_AMBULATORY_CARE_PROVIDER_SITE_OTHER): Payer: Self-pay | Admitting: Internal Medicine

## 2024-02-05 DIAGNOSIS — R638 Other symptoms and signs concerning food and fluid intake: Secondary | ICD-10-CM

## 2024-02-05 NOTE — Telephone Encounter (Signed)
 Left msg for pt in call me if needing refill of Topamax .

## 2024-02-20 ENCOUNTER — Ambulatory Visit: Admitting: Psychiatry

## 2024-03-03 ENCOUNTER — Ambulatory Visit: Admitting: Psychiatry

## 2024-03-08 ENCOUNTER — Ambulatory Visit (INDEPENDENT_AMBULATORY_CARE_PROVIDER_SITE_OTHER): Admitting: Internal Medicine

## 2024-03-17 ENCOUNTER — Telehealth: Admitting: Adult Health

## 2024-03-18 ENCOUNTER — Ambulatory Visit: Admitting: Psychiatry

## 2024-04-01 ENCOUNTER — Ambulatory Visit: Admitting: Psychiatry
